# Patient Record
Sex: Female | Born: 1950 | Race: White | Hispanic: No | Marital: Married | Smoking: Former smoker
Health system: Southern US, Community
[De-identification: ages and names within clinical notes are randomized; demographics above are authoritative.]

## PROBLEM LIST (undated history)

## (undated) DIAGNOSIS — G629 Polyneuropathy, unspecified: Secondary | ICD-10-CM

## (undated) DIAGNOSIS — G709 Myoneural disorder, unspecified: Secondary | ICD-10-CM

## (undated) DIAGNOSIS — Z972 Presence of dental prosthetic device (complete) (partial): Secondary | ICD-10-CM

## (undated) DIAGNOSIS — H269 Unspecified cataract: Secondary | ICD-10-CM

## (undated) DIAGNOSIS — M1990 Unspecified osteoarthritis, unspecified site: Secondary | ICD-10-CM

## (undated) DIAGNOSIS — Z973 Presence of spectacles and contact lenses: Secondary | ICD-10-CM

## (undated) DIAGNOSIS — I10 Essential (primary) hypertension: Secondary | ICD-10-CM

## (undated) DIAGNOSIS — R0600 Dyspnea, unspecified: Secondary | ICD-10-CM

## (undated) DIAGNOSIS — R51 Headache: Secondary | ICD-10-CM

## (undated) HISTORY — PX: EYE SURGERY: SHX253

## (undated) HISTORY — PX: SPINE SURGERY: SHX786

## (undated) HISTORY — PX: ABDOMINAL HYSTERECTOMY: SHX81

## (undated) HISTORY — DX: Unspecified osteoarthritis, unspecified site: M19.90

## (undated) HISTORY — DX: Unspecified cataract: H26.9

## (undated) HISTORY — PX: APPENDECTOMY: SHX54

## (undated) HISTORY — DX: Headache: R51

## (undated) HISTORY — PX: TUBOPLASTY / TUBOTUBAL ANASTOMOSIS: SUR1392

## (undated) HISTORY — PX: DILATION AND CURETTAGE OF UTERUS: SHX78

## (undated) HISTORY — DX: Essential (primary) hypertension: I10

## (undated) HISTORY — PX: STOMACH SURGERY: SHX791

## (undated) HISTORY — PX: KNEE SURGERY: SHX244

---

## 2000-01-08 ENCOUNTER — Encounter: Payer: Self-pay | Admitting: Internal Medicine

## 2000-01-08 ENCOUNTER — Ambulatory Visit (HOSPITAL_COMMUNITY): Admission: RE | Admit: 2000-01-08 | Discharge: 2000-01-08 | Payer: Self-pay | Admitting: Internal Medicine

## 2000-12-07 ENCOUNTER — Encounter: Payer: Self-pay | Admitting: Internal Medicine

## 2000-12-07 ENCOUNTER — Encounter: Admission: RE | Admit: 2000-12-07 | Discharge: 2000-12-07 | Payer: Self-pay | Admitting: Internal Medicine

## 2001-08-15 ENCOUNTER — Encounter: Payer: Self-pay | Admitting: Internal Medicine

## 2001-08-15 ENCOUNTER — Ambulatory Visit (HOSPITAL_COMMUNITY): Admission: RE | Admit: 2001-08-15 | Discharge: 2001-08-15 | Payer: Self-pay | Admitting: Internal Medicine

## 2003-01-31 ENCOUNTER — Encounter: Payer: Self-pay | Admitting: Family Medicine

## 2003-01-31 ENCOUNTER — Ambulatory Visit (HOSPITAL_COMMUNITY): Admission: RE | Admit: 2003-01-31 | Discharge: 2003-01-31 | Payer: Self-pay | Admitting: Family Medicine

## 2003-10-31 ENCOUNTER — Encounter: Admission: RE | Admit: 2003-10-31 | Discharge: 2003-10-31 | Payer: Self-pay | Admitting: Family Medicine

## 2004-01-23 ENCOUNTER — Encounter: Admission: RE | Admit: 2004-01-23 | Discharge: 2004-03-12 | Payer: Self-pay | Admitting: Neurosurgery

## 2004-04-14 ENCOUNTER — Emergency Department (HOSPITAL_COMMUNITY): Admission: EM | Admit: 2004-04-14 | Discharge: 2004-04-14 | Payer: Self-pay | Admitting: Emergency Medicine

## 2004-06-19 ENCOUNTER — Ambulatory Visit: Payer: Self-pay | Admitting: Family Medicine

## 2004-09-17 ENCOUNTER — Ambulatory Visit: Payer: Self-pay | Admitting: Family Medicine

## 2004-12-15 ENCOUNTER — Ambulatory Visit: Payer: Self-pay | Admitting: Family Medicine

## 2006-05-17 ENCOUNTER — Ambulatory Visit: Payer: Self-pay | Admitting: Family Medicine

## 2006-05-18 ENCOUNTER — Ambulatory Visit (HOSPITAL_COMMUNITY): Admission: RE | Admit: 2006-05-18 | Discharge: 2006-05-18 | Payer: Self-pay | Admitting: Family Medicine

## 2006-05-23 ENCOUNTER — Encounter: Admission: RE | Admit: 2006-05-23 | Discharge: 2006-05-23 | Payer: Self-pay | Admitting: Neurosurgery

## 2006-07-29 ENCOUNTER — Inpatient Hospital Stay (HOSPITAL_COMMUNITY): Admission: RE | Admit: 2006-07-29 | Discharge: 2006-07-31 | Payer: Self-pay | Admitting: Neurosurgery

## 2006-07-29 HISTORY — PX: CERVICAL FUSION: SHX112

## 2006-09-14 ENCOUNTER — Ambulatory Visit: Payer: Self-pay | Admitting: Family Medicine

## 2007-04-19 ENCOUNTER — Encounter: Payer: Self-pay | Admitting: Family Medicine

## 2007-05-12 HISTORY — PX: CERVICAL FUSION: SHX112

## 2007-07-05 ENCOUNTER — Encounter: Payer: Self-pay | Admitting: Family Medicine

## 2007-10-28 ENCOUNTER — Encounter: Payer: Self-pay | Admitting: Family Medicine

## 2008-05-06 ENCOUNTER — Emergency Department (HOSPITAL_COMMUNITY): Admission: EM | Admit: 2008-05-06 | Discharge: 2008-05-06 | Payer: Self-pay | Admitting: Family Medicine

## 2008-10-19 ENCOUNTER — Ambulatory Visit: Payer: Self-pay | Admitting: Family Medicine

## 2008-10-19 DIAGNOSIS — R32 Unspecified urinary incontinence: Secondary | ICD-10-CM | POA: Insufficient documentation

## 2008-10-19 DIAGNOSIS — H6120 Impacted cerumen, unspecified ear: Secondary | ICD-10-CM

## 2008-10-19 DIAGNOSIS — R51 Headache: Secondary | ICD-10-CM | POA: Insufficient documentation

## 2008-10-19 DIAGNOSIS — R519 Headache, unspecified: Secondary | ICD-10-CM | POA: Insufficient documentation

## 2009-01-01 ENCOUNTER — Ambulatory Visit: Payer: Self-pay | Admitting: Family Medicine

## 2009-01-01 DIAGNOSIS — R609 Edema, unspecified: Secondary | ICD-10-CM

## 2009-01-02 ENCOUNTER — Telehealth: Payer: Self-pay | Admitting: Family Medicine

## 2009-01-02 LAB — CONVERTED CEMR LAB
Albumin: 3.6 g/dL (ref 3.5–5.2)
Basophils Relative: 0.5 % (ref 0.0–3.0)
Bilirubin, Direct: 0 mg/dL (ref 0.0–0.3)
Glucose, Bld: 97 mg/dL (ref 70–99)
Hemoglobin: 13.2 g/dL (ref 12.0–15.0)
Lymphs Abs: 4.7 10*3/uL — ABNORMAL HIGH (ref 0.7–4.0)
Monocytes Relative: 5 % (ref 3.0–12.0)
Neutro Abs: 7.7 10*3/uL (ref 1.4–7.7)
Neutrophils Relative %: 58.1 % (ref 43.0–77.0)
Potassium: 4.2 meq/L (ref 3.5–5.1)
TSH: 0.86 microintl units/mL (ref 0.35–5.50)

## 2009-02-25 ENCOUNTER — Encounter (INDEPENDENT_AMBULATORY_CARE_PROVIDER_SITE_OTHER): Payer: Self-pay | Admitting: *Deleted

## 2009-10-08 ENCOUNTER — Encounter: Payer: Self-pay | Admitting: Family Medicine

## 2009-10-24 ENCOUNTER — Ambulatory Visit: Payer: Self-pay | Admitting: Family Medicine

## 2009-10-24 ENCOUNTER — Encounter: Admission: RE | Admit: 2009-10-24 | Discharge: 2009-10-24 | Payer: Self-pay | Admitting: Neurosurgery

## 2009-11-04 ENCOUNTER — Encounter: Payer: Self-pay | Admitting: Family Medicine

## 2010-04-29 ENCOUNTER — Ambulatory Visit: Payer: Self-pay | Admitting: Family Medicine

## 2010-04-29 DIAGNOSIS — M5030 Other cervical disc degeneration, unspecified cervical region: Secondary | ICD-10-CM | POA: Insufficient documentation

## 2010-04-29 DIAGNOSIS — L820 Inflamed seborrheic keratosis: Secondary | ICD-10-CM

## 2010-05-02 ENCOUNTER — Telehealth: Payer: Self-pay | Admitting: Family Medicine

## 2010-05-06 ENCOUNTER — Encounter: Payer: Self-pay | Admitting: Family Medicine

## 2010-05-06 ENCOUNTER — Ambulatory Visit: Payer: Self-pay | Admitting: Internal Medicine

## 2010-05-14 ENCOUNTER — Encounter: Payer: Self-pay | Admitting: Family Medicine

## 2010-05-14 ENCOUNTER — Ambulatory Visit
Admission: RE | Admit: 2010-05-14 | Discharge: 2010-05-14 | Payer: Self-pay | Source: Home / Self Care | Attending: Family Medicine | Admitting: Family Medicine

## 2010-05-30 ENCOUNTER — Ambulatory Visit
Admission: RE | Admit: 2010-05-30 | Discharge: 2010-05-30 | Payer: Self-pay | Source: Home / Self Care | Attending: Family Medicine | Admitting: Family Medicine

## 2010-06-01 ENCOUNTER — Encounter: Payer: Self-pay | Admitting: Neurosurgery

## 2010-06-10 NOTE — Letter (Signed)
Summary: [facility]  [facility]   Imported By: Maryln Gottron 12/10/2009 15:00:01  _____________________________________________________________________  External Attachment:    Type:   Image     Comment:   External Document

## 2010-06-10 NOTE — Letter (Signed)
Summary: [facility]  [facility]   Imported By: Maryln Gottron 10/16/2009 14:07:02  _____________________________________________________________________  External Attachment:    Type:   Image     Comment:   External Document

## 2010-06-10 NOTE — Assessment & Plan Note (Signed)
Summary: ear pain 3pm/njr   Vital Signs:  Patient profile:   60 year old female Weight:      170 pounds Temp:     98.4 degrees F oral BP sitting:   140 / 84  (left arm) Cuff size:   regular  Vitals Entered By: Kern Reap CMA Duncan Dull) (October 24, 2009 2:55 PM) CC: right ear pain   CC:  right ear pain.  History of Present Illness: [name]  is a 60 year old female who comes in today for evaluation of ear pain.  She thinks it may be related to ear wax  the patient was taken to the treatment room.  Via suction and irrigation by me.  The ear wax was removed.  She had no complications from the procedure, left the office in good condition with no complaints  Allergies: 1)  ! Penicillin  Past History:  Past medical, surgical, family and social histories (including risk factors) reviewed for relevance to current acute and chronic problems.  Past Medical History: Reviewed history from 10/19/2008 and no changes required. Headache Urinary incontinence  Past Surgical History: Reviewed history from 10/19/2008 and no changes required. Tuboplasty twice Appendectomy Hysterectomy Oophorectomy four level cervical spine fusion and plating per Dr. Tressie Stalker 2008  Family History: Reviewed history from 10/19/2008 and no changes required. Family History of Arthritis Family History Breast cancer 1st degree relative <50 Family History Hypertension  Social History: Reviewed history from 10/19/2008 and no changes required. Married Current Smoker Alcohol use-yes  Review of Systems      See HPI  Physical Exam  General:  Well-developed,well-nourished,in no acute distress; alert,appropriate and cooperative throughout examination Ears:  bilateral cerumen impactions   Impression & Recommendations:  Problem # 1:  CERUMEN IMPACTION (ICD-380.4) Assessment Deteriorated  Complete Medication List: 1)  Hydrocodone-acetaminophen 10-325 Mg Tabs (Hydrocodone-acetaminophen) .Marland Kitchen.. 1 by  mouth every 6 hours as needed for pain 2)  Lasix 20 Mg Tabs (Furosemide) .... Once daily as needed fluid  Patient Instructions: 1)  return p.r.n.

## 2010-06-12 NOTE — Assessment & Plan Note (Signed)
Summary: mole removal--per dr Batul Diego//ccm   Vital Signs:  Patient profile:   60 year old female Weight:      170 pounds O2 Sat:      98 % Temp:     98.2 degrees F Pulse rate:   92 / minute BP sitting:   130 / 80  (left arm) Cuff size:   regular  Vitals Entered By: Pura Spice, RN (May 14, 2010 3:28 PM) CC: mole removal on back    History of Present Illness: Here to remove a lesion in the center of her upper back. We evaluated this at our last visit. It appears to be a seborrheic keratosis.   Allergies: 1)  ! Penicillin  Review of Systems  The patient denies anorexia, fever, weight loss, weight gain, vision loss, decreased hearing, hoarseness, chest pain, syncope, dyspnea on exertion, peripheral edema, prolonged cough, headaches, hemoptysis, abdominal pain, melena, hematochezia, severe indigestion/heartburn, hematuria, incontinence, genital sores, muscle weakness, suspicious skin lesions, transient blindness, difficulty walking, depression, unusual weight change, abnormal bleeding, enlarged lymph nodes, angioedema, breast masses, and testicular masses.    Physical Exam  General:  Well-developed,well-nourished,in no acute distress; alert,appropriate and cooperative throughout examination Skin:  there is a dark brown raised papular lesion between the scapulae, about 1. 6 cm in diameter   Impression & Recommendations:  Problem # 1:  INFLAMED SEBORRHEIC KERATOSIS (ICD-702.11)  Orders: Excise lesion (TAL) 1.1 - 2.0 cm (11402)  Complete Medication List: 1)  Hydrocodone-acetaminophen 10-325 Mg Tabs (Hydrocodone-acetaminophen) .Marland Kitchen.. 1 by mouth every 6 hours as needed for pain 2)  Lasix 20 Mg Tabs (Furosemide) .... Once daily as needed fluid  Patient Instructions: 1)  After informed consent was obtained, the area was cleansed with Betadine. LA was achieved using 1% lidocaine with epinephrine. An ellipitical incision was made around the lesion with a scalpel down to the  subcutaneous fat layer. The lesion was removed and sent to Pathology. The wound was closed using 7 sutures of 4-0 Ethilon. It was dressed with Neosporin and a Bandaid. Sutures are to be removed after 14 days.    Orders Added: 1)  Excise lesion (TAL) 1.1 - 2.0 cm [11402]

## 2010-06-12 NOTE — Miscellaneous (Signed)
Summary: Consent for Mole Removal  Consent for Mole Removal   Imported By: Maryln Gottron 05/19/2010 13:51:36  _____________________________________________________________________  External Attachment:    Type:   Image     Comment:   External Document

## 2010-06-12 NOTE — Progress Notes (Signed)
Summary: Pain med wants early refill - denied  Phone Note Call from Patient Call back at [facility] Phone [phone] Call back at Work Phone [phone]   Caller: Patient Call For: Nelwyn Salisbury MD Summary of Call: Needs early refill on Hydrocodone????  Cannot be filled until Sunday.  Frazier Butt Initial call taken by: Lynann Beaver CMA AAMA,  May 02, 2010 8:24 AM  Follow-up for Phone Call        chart reviewed. prescription provided for hydrocodone 10/325mg  #60 rf5 dated 12/20. Also [place] narcotic database shows #100 hydrocodone filled 12/7 by Dr. Lovell Sheehan of [facility] Follow-up by: Edwyna Perfect MD,  May 02, 2010 9:24 AM  Additional Follow-up for Phone Call Additional follow up Details #1::        spoke with pt asked her if got rx  on 12/7 from Dr Lovell Sheehan at [facility] and she confirmed he did and she had it filled informed her that dr Daryan Cagley will be back on Tuesday and since filled by Dr Lovell Sheehan no early refill until Dr Acasia Skilton returns.  Additional Follow-up by: Pura Spice, RN,  May 02, 2010 9:36 AM    Additional Follow-up for Phone Call Additional follow up Details #2::    Since she is getting pain meds form Dr. Lovell Sheehan, she will need to get all of them from him. I will not be giving her any more  Follow-up by: Nelwyn Salisbury MD,  May 06, 2010 8:46 AM  Additional Follow-up for Phone Call Additional follow up Details #3:: Details for Additional Follow-up Action Taken: spoke with pt - informed her that per Dr. Clent Ridges - Dr. Lovell Sheehan will need to fill pain meds since she recv'd rx 12/7 from him. He will no longer fill pain med. Verbalized understanding. KIK Additional Follow-up by: Duard Brady LPN,  May 06, 2010 2:11 PM

## 2010-06-12 NOTE — Assessment & Plan Note (Signed)
Summary: remove sutures?dm   Vital Signs:  Patient profile:   60 year old female Pulse rate:   96 / minute Resp:     16 per minute BP sitting:   140 / 90  (left arm) Cuff size:   regular  Vitals Entered By: Pura Spice, RN (May 30, 2010 3:41 PM) CC: remove sutures   History of Present Illness: Here for suture removal after we removed a seborrheic keratosis from her back 2 weeks ago. The area itches but otherwise has been doing well.   Allergies: 1)  ! Penicillin  Past History:  Past Medical History: Reviewed history from 10/19/2008 and no changes required. Headache Urinary incontinence  Past Surgical History: Reviewed history from 10/19/2008 and no changes required. Tuboplasty twice Appendectomy Hysterectomy Oophorectomy four level cervical spine fusion and plating per Dr. Tressie Stalker 2008  Review of Systems  The patient denies anorexia, fever, weight loss, weight gain, vision loss, decreased hearing, hoarseness, chest pain, syncope, dyspnea on exertion, peripheral edema, prolonged cough, headaches, hemoptysis, abdominal pain, melena, hematochezia, severe indigestion/heartburn, hematuria, incontinence, genital sores, muscle weakness, suspicious skin lesions, transient blindness, difficulty walking, depression, unusual weight change, abnormal bleeding, enlarged lymph nodes, angioedema, breast masses, and testicular masses.    Physical Exam  General:  Well-developed,well-nourished,in no acute distress; alert,appropriate and cooperative throughout examination Skin:  the wound in the middle of her back is healing well   Impression & Recommendations:  Problem # 1:  INFLAMED SEBORRHEIC KERATOSIS (ICD-702.11)  Complete Medication List: 1)  Hydrocodone-acetaminophen 10-325 Mg Tabs (Hydrocodone-acetaminophen) .Marland Kitchen.. 1 by mouth every 6 hours as needed for pain 2)  Lasix 20 Mg Tabs (Furosemide) .... Once daily as needed fluid  Patient Instructions: 1)  sutures were  removed.  2)  Please schedule a follow-up appointment as needed .    Orders Added: 1)  Est. Patient Level II [21308]

## 2010-06-12 NOTE — Miscellaneous (Signed)
Summary: BONE DENSITY  Clinical Lists Changes  Orders: Added new Test order of T-Bone Densitometry (77080) - Signed Added new Test order of T-Lumbar Vertebral Assessment (77082) - Signed 

## 2010-06-12 NOTE — Assessment & Plan Note (Signed)
Summary: mole check/njr   Vital Signs:  Patient profile:   60 year old female O2 Sat:      98 % Temp:     98.6 degrees F BP sitting:   130 / 78  (left arm)  Vitals Entered By: Pura Spice, RN (April 29, 2010 3:25 PM) CC: ck mole mid back stated pass 2 wks its been hurting  refill hydrocodone    History of Present Illness: Here for several reasons. First she has had a lesion in the middle of her upper back for years, but lately it has gotten very tender and she wants it removed. Also she needs refills on her pain meds. She has seen Dr. Tressie Stalker about the disc disease in her neck, and he has proposed a repeat fusion surgery for C4-T1. However they agreed to postpone this as long as possible. She also asks about the growing hump on the back of her neck. She has never had a bone density test.   Allergies: 1)  ! Penicillin  Past History:  Past Medical History: Reviewed history from 10/19/2008 and no changes required. Headache Urinary incontinence  Past Surgical History: Reviewed history from 10/19/2008 and no changes required. Tuboplasty twice Appendectomy Hysterectomy Oophorectomy four level cervical spine fusion and plating per Dr. Tressie Stalker 2008  Review of Systems  The patient denies anorexia, fever, weight loss, weight gain, vision loss, decreased hearing, hoarseness, chest pain, syncope, dyspnea on exertion, peripheral edema, prolonged cough, headaches, hemoptysis, abdominal pain, melena, hematochezia, severe indigestion/heartburn, hematuria, incontinence, genital sores, muscle weakness, suspicious skin lesions, transient blindness, difficulty walking, depression, unusual weight change, abnormal bleeding, enlarged lymph nodes, angioedema, breast masses, and testicular masses.    Physical Exam  General:  Well-developed,well-nourished,in no acute distress; alert,appropriate and cooperative throughout examination Neck:  she has a mildly prominent Dowagers hump  on the back of the neck Skin:  she has a large inflamed seborrheic keratosis on the upper back    Impression & Recommendations:  Problem # 1:  DEGENERATIVE DISC DISEASE, CERVICAL SPINE (ICD-722.4)  Problem # 2:  INFLAMED SEBORRHEIC KERATOSIS (ICD-702.11)  Complete Medication List: 1)  Hydrocodone-acetaminophen 10-325 Mg Tabs (Hydrocodone-acetaminophen) .Marland Kitchen.. 1 by mouth every 6 hours as needed for pain 2)  Lasix 20 Mg Tabs (Furosemide) .... Once daily as needed fluid  Other Orders: T-Bone Densitometry [phone])  Patient Instructions: 1)  We will plan on excising the back lesion soon. refilled her Vicodin. Set her up for a DEXA soon. Prescriptions: HYDROCODONE-ACETAMINOPHEN 10-325 MG TABS (HYDROCODONE-ACETAMINOPHEN) 1 by mouth every 6 hours as needed for pain  #60 x 5   Entered and Authorized by:   Nelwyn Salisbury MD   Signed by:   Nelwyn Salisbury MD on 04/29/2010   Method used:   Print then Give to Patient   RxID:   [id number]    Orders Added: 1)  Est. Patient Level IV [95621] 2)  T-Bone Densitometry [30865]

## 2010-09-16 ENCOUNTER — Ambulatory Visit (INDEPENDENT_AMBULATORY_CARE_PROVIDER_SITE_OTHER): Payer: Self-pay | Admitting: Family Medicine

## 2010-09-16 ENCOUNTER — Encounter: Payer: Self-pay | Admitting: Family Medicine

## 2010-09-16 VITALS — BP 156/92 | HR 88 | Temp 98.9°F | Ht 64.0 in | Wt 175.0 lb

## 2010-09-16 DIAGNOSIS — G589 Mononeuropathy, unspecified: Secondary | ICD-10-CM

## 2010-09-16 DIAGNOSIS — M542 Cervicalgia: Secondary | ICD-10-CM

## 2010-09-16 DIAGNOSIS — G629 Polyneuropathy, unspecified: Secondary | ICD-10-CM

## 2010-09-16 DIAGNOSIS — G8929 Other chronic pain: Secondary | ICD-10-CM

## 2010-09-16 LAB — CBC WITH DIFFERENTIAL/PLATELET
Eosinophils Absolute: 0.1 10*3/uL (ref 0.0–0.7)
Hemoglobin: 12.8 g/dL (ref 12.0–15.0)
Lymphocytes Relative: 42.5 % (ref 12.0–46.0)
Lymphs Abs: 5.3 10*3/uL — ABNORMAL HIGH (ref 0.7–4.0)
MCHC: 33.1 g/dL (ref 30.0–36.0)
Monocytes Absolute: 0.8 10*3/uL (ref 0.1–1.0)
Platelets: 296 10*3/uL (ref 150.0–400.0)
RDW: 14 % (ref 11.5–14.6)
WBC: 12.5 10*3/uL — ABNORMAL HIGH (ref 4.5–10.5)

## 2010-09-16 LAB — BASIC METABOLIC PANEL
BUN: 9 mg/dL (ref 6–23)
CO2: 25 mEq/L (ref 19–32)
Calcium: 8.5 mg/dL (ref 8.4–10.5)
Chloride: 106 mEq/L (ref 96–112)
Creatinine, Ser: 0.7 mg/dL (ref 0.4–1.2)
Glucose, Bld: 74 mg/dL (ref 70–99)
Sodium: 139 mEq/L (ref 135–145)

## 2010-09-16 LAB — HEPATIC FUNCTION PANEL
ALT: 6 U/L (ref 0–35)
AST: 13 U/L (ref 0–37)
Alkaline Phosphatase: 94 U/L (ref 39–117)
Total Bilirubin: 0.1 mg/dL — ABNORMAL LOW (ref 0.3–1.2)
Total Protein: 6.7 g/dL (ref 6.0–8.3)

## 2010-09-16 LAB — POCT URINALYSIS DIPSTICK
Bilirubin, UA: NEGATIVE
Ketones, UA: NEGATIVE
Leukocytes, UA: NEGATIVE
Protein, UA: NEGATIVE
Spec Grav, UA: 1.015
Urobilinogen, UA: 0.2
pH, UA: 5.5

## 2010-09-16 LAB — VITAMIN B12: Vitamin B-12: 153 pg/mL — ABNORMAL LOW (ref 211–911)

## 2010-09-16 MED ORDER — GABAPENTIN 100 MG PO CAPS: 100.0000 mg | ORAL_CAPSULE | Freq: Three times a day (TID) | ORAL | Status: DC

## 2010-09-16 NOTE — Progress Notes (Signed)
  Subjective:    Patient ID: [name], female    DOB: 20-Jun-1950, 60 y.o.   MRN: [id number]  HPI Here to ask advice about pain management. She has been seeing Dr. Lovell Sheehan about her cervical spine disease and the pain in her neck and arms. He has been refilling her Vicodin but they will not schedule her for any more office visits until she pays her overdue bills. Now she has developed tingling and burning sensations all over her body in the past 2 months. No other changes.    Review of Systems  Constitutional: Negative.   Respiratory: Negative.   Cardiovascular: Negative.   Musculoskeletal: Positive for back pain.  Neurological: Negative.        Objective:   Physical Exam  Constitutional: She is oriented to person, place, and time. She appears well-developed and well-nourished.  Cardiovascular: Normal rate, regular rhythm, normal heart sounds and intact distal pulses.   Pulmonary/Chest: Effort normal and breath sounds normal.  Neurological: She is alert and oriented to person, place, and time. No cranial nerve deficit. She exhibits normal muscle tone. Coordination normal.          Assessment & Plan:  This sounds like a neuropathy that has started. Get labs today to look for metabolic causes for this. Add Neurontin to her pain meds. Recheck in 2 weeks

## 2010-09-18 NOTE — Progress Notes (Signed)
Pt Informed. Will call to schedule Injection appointment.

## 2010-09-26 NOTE — Op Note (Signed)
[name], [name] NO.:  [id number]   MEDICAL RECORD NO.:  [id number]          PATIENT TYPE:  INP   LOCATION:  3105                         FACILITY:  [facility]   PHYSICIAN:  Cristi Loron, M.D.DATE OF BIRTH:  [birth year]   DATE OF PROCEDURE:  07/30/2006  DATE OF DISCHARGE:                               OPERATIVE REPORT   BRIEF HISTORY:  The patient is a 60 year old white female who has  suffered from neck and arm pain.  She has failed medical management, was  worked up with a cervical MRI which demonstrated significant spondylosis  and stenosis at C4-5, C5-6, C6-7 and C7-S1 which corresponded to the  patient's symptoms.  I discussed the various treatment options with the  patient including surgery.  She and her family have weighed the risks,  benefits and alternatives.  She decided to proceed with a four level  anterior cervical diskectomy and fusion plating.   PREOPERATIVE DIAGNOSIS:  C4-5, C5-6, C6-7 and C-7-1 spondylosis,  stenosis, cervical radiculopathy, cervicalgia.   POSTOPERATIVE DIAGNOSES:  C4-5, C5-6, C6-7 and C-7-1 spondylosis,  stenosis, cervical radiculopathy, cervicalgia.   PROCEDURE:  C4-5, C5-6, C6-7 and C-7-1 extensive anterior cervical  diskectomy/decompression; C4-5, C5-6, C6-7, C-7-1 interbody arthrodesis  with local autograft bone; insertion of C4-5, C6-7, C6-7 C-7-1 interbody  prosthesis (Alphatec PEEK interbody prosthesis); anterior cervical  plating C4 to T1 using Codman Slim-Loc titanium plate and screws.   SURGEON:  Dr. Delma Officer   ASSISTANT:  Dr. Barnett Abu   ANESTHESIA:  General endotracheal.   ESTIMATED BLOOD LOSS:  200 mL.   SPECIMENS:  None.   DRAINS:  One 7 mm Jackson-Pratt drain in the prevertebral space.   COMPLICATIONS:  None.   DESCRIPTION OF PROCEDURE:  The patient was brought to the operating room  by anesthesia team.  General endotracheal anesthesia was induced.  The  patient remained in the supine  position.  A roll was placed under her  shoulders to place her neck in slight extension.  Her anterior cervical  region was then prepared with Betadine scrub and Betadine solution.  Sterile drapes were applied and then injected the area to be incised  with Marcaine with epinephrine solution.  I used a scalpel to make a  transverse incision in the patient's left anterior neck.  I used a  Metzenbaum scissors to divide the platysma muscle and then to dissect  medial to sternocleidomastoid muscle of jugular vein and carotid artery.  I carefully dissected down towards the anterior cervical spine,  carefully identifying the esophagus and retracted it medially.  We then  used Kitner swabs to clear the soft tissue from the anterior cervical  spine and then inserted a bent spinal needle into the upper exposed  intervertebral disc space.  We then obtained intraoperative radiograph  to confirm our location.   We then used electrocautery to detach the medial border of the longus  colli muscle bilaterally from C4-5, C5-6, C6-7 and C-7-1 intervertebral  disk space.  We then inserted Caspar self-retaining retractor for  exposure.  We began at C4-5, incised the C4-5  intervertebral disk with  15 blade scalpel.  We performed a partial intervertebral diskectomy  using pituitary forceps and Carlen curettes.  We then inserted  distraction screws at C4 and C5 and then distracted the interspace.  We  then used a high-speed drill to decorticate the vertebral endplates at  C4-5, drill away the remainder of the C4-5 intervertebral disk, drill  away some posterior spondylosis and then to thin out the posterior  longitudinal ligament.  We then incised the ligament with the with the  arachnoid knife and then removed it with Kerrison punch undercutting the  vertebral endplates decompressing the thecal sac and then performed  foraminotomy a foraminotomy about the bilateral C5 nerve root completing  the decompression  at this level.   We then repeated this procedure in an analogous fashion at C5-6, C6-7,  and C-7-1, decompressing the thecal sac/spinal cord at the C5-6, C6-7  and C-7-1 and performing foraminotomies about the bilateral C6, C7 and 1  nerve roots.   After completing the decompression we now turned attention to the  arthrodesis.  We used the trial spacers and determined to use Alphatec  medium interbody prosthesis.  We prefilled these prosthesis with a  combination of local autograft bone we painted on the decompression as  well as bone graft extender.  We then inserted these prosthesis into  distracted interspaces sequentially.  We then removed the distraction  screws.  There was good snug fit of the prosthesis at each level.   Having completed the arthrodesis insertion of the prosthesis, we now  turned our attention to the anterior spinal instrumentation.  We used a  high-speed drill to remove some ventral spondylosis from the vertebral  endplates at C4-5, C5-6, and C6-7 and C7-1 so that the plate would lay  down flat.  We then selected the appropriate length Codman Slim-Loc  anterior cervical plate and laid it along the anterior aspect of the  vertebral bodies from C4 to T1.  We then drilled two 12 mm holes at the  C4-5, C5-6, C6-7 and C-7-1.  We then secured the plate to the vertebral  bodies by placing two 12 mL mm self-tapping screws at C4-5, C6-7, and C-  7-1.  We obtained intraoperative radiograph that demonstrated good  position of plate, screws, upper plate and screws and prosthesis.  The  lower plate and screws looked good in vivo.  We therefore secured the  screws and plate by locking each cam.   We then obtained hemostasis using bipolar cautery.  We irrigated the  wound out with bacitracin solution, removed the retractor and then  inspected the esophagus for any damage.  There was none apparent.  We then placed a 7 mm flat Jackson-Pratt drain in the prevertebral space.  We  tunneled out through a separate stab wound.  We then reapproximated  the patient's platysma muscle with interrupted 3-0 Vicryl suture,  subcutaneous tissue with a 3-0 Vicryl suture and skin with Steri-Strips  and Benzoin.  We sutured the drain site to the site with a 3-0 nylon  suture.  We then coated the wound with Bacitracin ointment and applied a  sterile dressing.  We removed the drapes.  The patient was subsequently  extubated by the anesthesia team and transported to post anesthesia care  unit in stable condition.  All sponge, instrument and needle counts  correct at the end of this case.      Cristi Loron, M.D.  Electronically Signed  JDJ/MEDQ  D:  07/30/2006  T:  07/30/2006  Job:  [id number]

## 2010-09-26 NOTE — Discharge Summary (Signed)
NAMELUCILE, [name] NO.:  [id number]   MEDICAL RECORD NO.:  [id number]          PATIENT TYPE:  INP   LOCATION:  3004                         FACILITY:  [facility]   PHYSICIAN:  Cristi Loron, M.D.DATE OF BIRTH:  [birth year]   DATE OF ADMISSION:  07/29/2006  DATE OF DISCHARGE:  07/31/2006                               DISCHARGE SUMMARY   BRIEF HISTORY:  The patient is a 60 year old female who has suffered  from neck and arm pain.  She has failed medical management and was  worked up with a cervical MRI, which demonstrated significant  spondylosis and stenosis at C4-5, 5-6, 6-7, and 7-1, which corresponded  to the patient's symptoms.  I discussed the various treatment options  with the patient, including surgery.  She and her family have weighed  the risks, benefits, and alternative to surgery and decided to proceed  with a 4-level anterior cervical discectomy, fusion, and plating.   For further details of this admission, please refer to typed history and  physical.   HOSPITAL COURSE:  I admitted the patient to [facility] on July 29, 2006.  On the [place] of admission, I performed a C4-5, 5-6, 6-7, 7-1  anterior cervical discectomy, interbody prosthesis, arthrodesis, and  plating.  The surgery went well.  (For full details of this operation,  please refer to typed operative note.)   POSTOPERATIVE COURSE:  The patient's postoperative course was  unremarkable.  She was discharged home on postoperative Lindstrom #2, at which  time she was eating well, ambulating well, she was afebrile and vital  signs were stable.   DISCHARGE INSTRUCTIONS:  The patient was given written discharge  instructions and instructed to follow up with me in four weeks.   FINAL DIAGNOSIS:  C4-5, 5-6, 6-7, and 7-1 spondylosis, stenosis,  cervical radiculopathy, cervicalgia.   PROCEDURE PERFORMED:  C4-5, 5-6, 6-7, and 7-1 extensive anterior  cervical discectomy/decompression; C4-5, 5-6,  6-7 interbody arthrodesis  with local autograft bone; C4-5, 5-6, 6-7, and 7-1 interbody prosthesis  (Alphatec PEEK interbody prosthesis); anterior cervical plate C4 to T1  using Codman Slim-Lock titanium plate and screws.      Cristi Loron, M.D.  Electronically Signed     JDJ/MEDQ  D:  09/01/2006  T:  09/01/2006  Job:  [id number]

## 2010-09-30 ENCOUNTER — Encounter: Payer: Self-pay | Admitting: Family Medicine

## 2010-09-30 ENCOUNTER — Ambulatory Visit (INDEPENDENT_AMBULATORY_CARE_PROVIDER_SITE_OTHER): Payer: BC Managed Care – PPO | Admitting: Family Medicine

## 2010-09-30 VITALS — BP 140/88 | Temp 98.1°F | Wt 178.0 lb

## 2010-09-30 DIAGNOSIS — I10 Essential (primary) hypertension: Secondary | ICD-10-CM

## 2010-09-30 DIAGNOSIS — E538 Deficiency of other specified B group vitamins: Secondary | ICD-10-CM

## 2010-09-30 DIAGNOSIS — G589 Mononeuropathy, unspecified: Secondary | ICD-10-CM

## 2010-09-30 DIAGNOSIS — G629 Polyneuropathy, unspecified: Secondary | ICD-10-CM

## 2010-09-30 MED ORDER — CYANOCOBALAMIN 1000 MCG/ML IJ SOLN
1000.0000 ug | Freq: Once | INTRAMUSCULAR | Status: AC
  Administered 2010-09-30: 1000 ug via INTRAMUSCULAR

## 2010-09-30 MED ORDER — LISINOPRIL 10 MG PO TABS: 10.0000 mg | ORAL_TABLET | Freq: Every day | ORAL | Status: DC

## 2010-09-30 NOTE — Progress Notes (Signed)
  Subjective:    Patient ID: [name], female    DOB: 02/13/1951, 60 y.o.   MRN: [id number]  HPI Here to follow up on neuropathy and HTN. Since starting on Neurontin she has felt much better and is very pleased. The burning pains in her arms and hands is gone. She is now able to sleep well at night and has more energy.    Review of Systems  Constitutional: Negative.   Respiratory: Negative.   Cardiovascular: Negative.        Objective:   Physical Exam  Constitutional: She appears well-developed and well-nourished.  Cardiovascular: Normal rate, regular rhythm, normal heart sounds and intact distal pulses.   Pulmonary/Chest: Effort normal and breath sounds normal.          Assessment & Plan:  Stay on Neurontin and weekly B12 shots. Start on Lisinopril for HTN.

## 2010-10-09 ENCOUNTER — Ambulatory Visit (INDEPENDENT_AMBULATORY_CARE_PROVIDER_SITE_OTHER): Payer: BC Managed Care – PPO | Admitting: Family Medicine

## 2010-10-09 DIAGNOSIS — E538 Deficiency of other specified B group vitamins: Secondary | ICD-10-CM

## 2010-10-09 MED ORDER — CYANOCOBALAMIN 1000 MCG/ML IJ SOLN
1000.0000 ug | Freq: Once | INTRAMUSCULAR | Status: AC
  Administered 2010-10-09: 1000 ug via INTRAMUSCULAR

## 2010-10-09 MED ORDER — CYANOCOBALAMIN 1000 MCG/ML IJ SOLN: 1000.0000 ug | Freq: Once | INTRAMUSCULAR | Status: DC

## 2010-10-09 MED ORDER — "SYRINGE/NEEDLE (DISP) 25G X 5/8"" 3 ML MISC": 1.0000 mL | Status: DC

## 2010-10-09 MED ORDER — HYDROCODONE-ACETAMINOPHEN 10-325 MG PO TABS: 1.0000 | ORAL_TABLET | Freq: Four times a day (QID) | ORAL | Status: DC | PRN

## 2010-10-09 NOTE — Patient Instructions (Signed)
Patient received education on self injection of future B12. Rx[s] phoned in to [facility] [place] for B12 injection solution, BD injection needle [25Gx5/8"] with 3mL syringe.  Also Rx phoned in for Hydrocodone-APAP 10-325mg  tablets #60x5 per VO Dr Clent Ridges at patient request.

## 2010-10-12 ENCOUNTER — Emergency Department (HOSPITAL_COMMUNITY)
Admission: EM | Admit: 2010-10-12 | Discharge: 2010-10-13 | Disposition: A | Discharge status: Home / Self Care | Payer: BC Managed Care – PPO | Attending: Emergency Medicine | Admitting: Emergency Medicine

## 2010-10-12 DIAGNOSIS — S61209A Unspecified open wound of unspecified finger without damage to nail, initial encounter: Secondary | ICD-10-CM | POA: Insufficient documentation

## 2010-10-12 DIAGNOSIS — W268XXA Contact with other sharp object(s), not elsewhere classified, initial encounter: Secondary | ICD-10-CM | POA: Insufficient documentation

## 2010-10-30 ENCOUNTER — Encounter: Payer: Self-pay | Admitting: Family Medicine

## 2010-10-30 ENCOUNTER — Ambulatory Visit (INDEPENDENT_AMBULATORY_CARE_PROVIDER_SITE_OTHER): Payer: BC Managed Care – PPO | Admitting: Family Medicine

## 2010-10-30 VITALS — BP 110/80 | HR 78 | Wt 171.0 lb

## 2010-10-30 DIAGNOSIS — G589 Mononeuropathy, unspecified: Secondary | ICD-10-CM

## 2010-10-30 DIAGNOSIS — E538 Deficiency of other specified B group vitamins: Secondary | ICD-10-CM

## 2010-10-30 DIAGNOSIS — I10 Essential (primary) hypertension: Secondary | ICD-10-CM

## 2010-10-30 DIAGNOSIS — G629 Polyneuropathy, unspecified: Secondary | ICD-10-CM

## 2010-10-30 MED ORDER — CYANOCOBALAMIN 1000 MCG/ML IJ SOLN
1000.0000 ug | Freq: Once | INTRAMUSCULAR | Status: AC
  Administered 2010-10-30: 1000 ug via INTRAMUSCULAR

## 2010-10-30 NOTE — Progress Notes (Signed)
Addended by: Romualdo Bolk on: 10/30/2010 10:12 AM   Modules accepted: Orders

## 2010-10-30 NOTE — Progress Notes (Signed)
  Subjective:    Patient ID: [name], female    DOB: Sep 29, 1950, 60 y.o.   MRN: [id number]  HPI Here to follow up on newly diagnosed HTN. She is on Lisinopril, and it is working well. She feels good, and the neuropathy is well controlled.   Review of Systems  Constitutional: Negative.   Respiratory: Negative.   Cardiovascular: Negative.   Neurological: Negative.        Objective:   Physical Exam  Constitutional: She appears well-developed and well-nourished.  Cardiovascular: Normal rate, regular rhythm, normal heart sounds and intact distal pulses.   Pulmonary/Chest: Effort normal and breath sounds normal.          Assessment & Plan:  Stay on current meds.

## 2010-11-25 ENCOUNTER — Other Ambulatory Visit: Payer: Self-pay | Admitting: Family Medicine

## 2010-11-26 MED ORDER — FUROSEMIDE 20 MG PO TABS: 20.0000 mg | ORAL_TABLET | Freq: Every day | ORAL | Status: DC

## 2010-12-12 ENCOUNTER — Emergency Department (HOSPITAL_COMMUNITY)
Admission: EM | Admit: 2010-12-12 | Discharge: 2010-12-12 | Disposition: A | Discharge status: Home / Self Care | Payer: BC Managed Care – PPO | Attending: Emergency Medicine | Admitting: Emergency Medicine

## 2010-12-12 ENCOUNTER — Ambulatory Visit (INDEPENDENT_AMBULATORY_CARE_PROVIDER_SITE_OTHER): Payer: BC Managed Care – PPO | Admitting: Family Medicine

## 2010-12-12 ENCOUNTER — Encounter: Payer: Self-pay | Admitting: Family Medicine

## 2010-12-12 VITALS — BP 124/82 | HR 92 | Temp 98.3°F | Wt 173.0 lb

## 2010-12-12 DIAGNOSIS — I10 Essential (primary) hypertension: Secondary | ICD-10-CM | POA: Insufficient documentation

## 2010-12-12 DIAGNOSIS — T783XXA Angioneurotic edema, initial encounter: Secondary | ICD-10-CM | POA: Insufficient documentation

## 2010-12-12 DIAGNOSIS — Z79899 Other long term (current) drug therapy: Secondary | ICD-10-CM | POA: Insufficient documentation

## 2010-12-12 DIAGNOSIS — X58XXXA Exposure to other specified factors, initial encounter: Secondary | ICD-10-CM | POA: Insufficient documentation

## 2010-12-12 MED ORDER — AMLODIPINE BESYLATE 5 MG PO TABS: 5.0000 mg | ORAL_TABLET | Freq: Every day | ORAL | Status: DC

## 2010-12-12 NOTE — Progress Notes (Signed)
  Subjective:    Patient ID: [name], female    DOB: 03-24-1951, 60 y.o.   MRN: [id number]  HPI Here to follow up an ER visit last night for tongue swelling. She had the sudden onset of this at home yesterday afternoon and it did not respond to Benadryl. At the ER she received IV steroids and responded well. They told her it was a reaction to Lisinopril, so she has stopped taking this. We started her on this for HTN about 6 weeks ago. Now she feels fine and her tongue is back to normal. She never had any wheezing or SOB.    Review of Systems  Constitutional: Negative.   HENT: Negative.   Respiratory: Negative.   Cardiovascular: Negative.        Objective:   Physical Exam  Constitutional: She appears well-developed and well-nourished.  HENT:  Right Ear: External ear normal.  Left Ear: External ear normal.  Nose: Nose normal.  Mouth/Throat: Oropharynx is clear and moist. No oropharyngeal exudate.  Eyes: Conjunctivae are normal. Pupils are equal, round, and reactive to light.  Neck: No thyromegaly present.  Pulmonary/Chest: Effort normal and breath sounds normal.  Lymphadenopathy:    She has no cervical adenopathy.          Assessment & Plan:  This was angioedema related to the ACE inhibitor. Stay off Lisinopril, and we will replace it with Amlodipine. Recheck in one month

## 2010-12-23 ENCOUNTER — Other Ambulatory Visit: Payer: Self-pay | Admitting: Family Medicine

## 2010-12-25 ENCOUNTER — Telehealth: Payer: Self-pay | Admitting: Family Medicine

## 2010-12-25 NOTE — Telephone Encounter (Signed)
Refill request for Hydrocodon-Acetaminophn 10-325 take 1 po q6hrs prn. Pt last here on 12/12/10 and script last filled on 12/04/10.

## 2010-12-25 NOTE — Telephone Encounter (Signed)
[facility]. She is completely out. Thanks.

## 2010-12-26 ENCOUNTER — Telehealth: Payer: Self-pay | Admitting: Family Medicine

## 2010-12-26 MED ORDER — HYDROCODONE-ACETAMINOPHEN 10-325 MG PO TABS: 1.0000 | ORAL_TABLET | Freq: Four times a day (QID) | ORAL | Status: DC | PRN

## 2010-12-26 NOTE — Telephone Encounter (Signed)
Script called in and left message for pt. 

## 2010-12-26 NOTE — Telephone Encounter (Signed)
Change the Vicodin rx to say #120 with 5 rf

## 2010-12-26 NOTE — Telephone Encounter (Signed)
NO early refills. I gave her a rx in May that was to last for 6 months

## 2010-12-26 NOTE — Telephone Encounter (Signed)
I spoke with pt and she is taking pain medication 4 a Maret and that she is in pain and has to take this amount for relief.

## 2010-12-26 NOTE — Telephone Encounter (Signed)
Script called in and left voice message for pt. 

## 2011-01-08 ENCOUNTER — Ambulatory Visit (INDEPENDENT_AMBULATORY_CARE_PROVIDER_SITE_OTHER): Payer: BC Managed Care – PPO | Admitting: Family Medicine

## 2011-01-08 ENCOUNTER — Encounter: Payer: Self-pay | Admitting: Family Medicine

## 2011-01-08 VITALS — BP 124/82 | HR 104 | Temp 98.5°F | Ht 64.0 in | Wt 175.0 lb

## 2011-01-08 DIAGNOSIS — I10 Essential (primary) hypertension: Secondary | ICD-10-CM

## 2011-01-08 NOTE — Progress Notes (Signed)
  Subjective:    Patient ID: [name], female    DOB: Oct 15, 1950, 60 y.o.   MRN: [id number]  HPI Here to follow up after a bout of angioedema 4 weeks ago from Lisinopril. This was stopped and the swelling resolved. She has been on Amlodipine, and this is working well. She feels great.    Review of Systems  Constitutional: Negative.   Respiratory: Negative.   Cardiovascular: Negative.        Objective:   Physical Exam  Constitutional: She appears well-developed and well-nourished.  Cardiovascular: Normal rate, regular rhythm, normal heart sounds and intact distal pulses.   Pulmonary/Chest: Effort normal and breath sounds normal.          Assessment & Plan:  Doing well on Amlodipine.

## 2011-02-19 ENCOUNTER — Telehealth: Payer: Self-pay | Admitting: Family Medicine

## 2011-02-19 NOTE — Telephone Encounter (Signed)
Pt called and has gotten a refill sent to a pharmacy in [place] when pt was out of town. Pt said because this is a controlled drug, that she would have to have a new script sent in to [facility], since pt is now back in town, in order for pharmacy to fill.

## 2011-02-20 NOTE — Telephone Encounter (Signed)
Please find out more about this

## 2011-02-23 MED ORDER — HYDROCODONE-ACETAMINOPHEN 10-325 MG PO TABS: 1.0000 | ORAL_TABLET | Freq: Four times a day (QID) | ORAL | Status: DC | PRN

## 2011-02-23 NOTE — Telephone Encounter (Signed)
Pt called back to check on status of getting new script forHYDROcodone-acetaminophen (NORCO) 10-325 MG per tablet  . Pt said that she had gone out of town to [place] and had to have [facility], but now that she is back in town,the script will not transfer back to [place] because it is a controlled substance. There were 4 refills remaining on orig script.

## 2011-02-23 NOTE — Telephone Encounter (Signed)
Please call pt at [phone]

## 2011-02-23 NOTE — Telephone Encounter (Signed)
Please give them the okay for the remaining 4 refills

## 2011-02-23 NOTE — Telephone Encounter (Signed)
Script called in and pt aware. 

## 2011-04-15 ENCOUNTER — Encounter: Payer: Self-pay | Admitting: Family Medicine

## 2011-04-15 ENCOUNTER — Ambulatory Visit (INDEPENDENT_AMBULATORY_CARE_PROVIDER_SITE_OTHER): Payer: BC Managed Care – PPO | Admitting: Family Medicine

## 2011-04-15 VITALS — BP 122/80 | HR 78 | Temp 98.2°F | Wt 174.0 lb

## 2011-04-15 DIAGNOSIS — J40 Bronchitis, not specified as acute or chronic: Secondary | ICD-10-CM

## 2011-04-15 MED ORDER — AZITHROMYCIN 250 MG PO TABS: ORAL_TABLET | ORAL | Status: DC

## 2011-04-15 MED ORDER — HYDROCODONE-HOMATROPINE 5-1.5 MG/5ML PO SYRP: 5.0000 mL | ORAL_SOLUTION | ORAL | Status: DC | PRN

## 2011-04-15 NOTE — Progress Notes (Signed)
  Subjective:    Patient ID: [name], female    DOB: Nov 24, 1950, 60 y.o.   MRN: [id number]  HPI Here for 2 weeks of chest congestion and coughing up green sputum. No fever.    Review of Systems  Constitutional: Negative.   HENT: Negative.   Eyes: Negative.   Respiratory: Positive for cough.        Objective:   Physical Exam  Constitutional: She appears well-developed and well-nourished.  HENT:  Right Ear: External ear normal.  Left Ear: External ear normal.  Nose: Nose normal.  Mouth/Throat: Oropharynx is clear and moist. No oropharyngeal exudate.  Eyes: Conjunctivae are normal.  Neck: No thyromegaly present.  Pulmonary/Chest: Effort normal. She has no wheezes. She has no rales.       Scattered rhonchi  Lymphadenopathy:    She has no cervical adenopathy.          Assessment & Plan:  Recheck prn

## 2011-07-07 ENCOUNTER — Telehealth: Payer: Self-pay | Admitting: Family Medicine

## 2011-07-07 NOTE — Telephone Encounter (Signed)
Refill request for Norco 10-325 take 1 po q6hrs prn and pt last here on 04/15/11.

## 2011-07-08 ENCOUNTER — Telehealth: Payer: Self-pay | Admitting: Family Medicine

## 2011-07-08 MED ORDER — HYDROCODONE-ACETAMINOPHEN 10-325 MG PO TABS: 1.0000 | ORAL_TABLET | Freq: Four times a day (QID) | ORAL | Status: DC | PRN

## 2011-07-08 NOTE — Telephone Encounter (Signed)
Pt called req refill of HYDROcodone-acetaminophen (NORCO) 10-325 MG per tablet to [facility]

## 2011-07-08 NOTE — Telephone Encounter (Signed)
Script was called in to pharmacy today.

## 2011-07-08 NOTE — Telephone Encounter (Signed)
Call in #120 with 5 rf 

## 2011-07-08 NOTE — Telephone Encounter (Signed)
Rx called in to pharmacy. 

## 2011-08-06 ENCOUNTER — Telehealth: Payer: Self-pay | Admitting: Family Medicine

## 2011-08-06 MED ORDER — AMLODIPINE BESYLATE 5 MG PO TABS: 5.0000 mg | ORAL_TABLET | Freq: Every day | ORAL | Status: DC

## 2011-08-06 NOTE — Telephone Encounter (Signed)
I sent script e-scribe and pt did request a 90 Knezevic supply.

## 2011-09-27 ENCOUNTER — Other Ambulatory Visit: Payer: Self-pay | Admitting: Family Medicine

## 2011-10-12 ENCOUNTER — Other Ambulatory Visit: Payer: Self-pay | Admitting: Family Medicine

## 2011-10-13 ENCOUNTER — Other Ambulatory Visit: Payer: Self-pay | Admitting: Family Medicine

## 2011-10-13 MED ORDER — AMLODIPINE BESYLATE 5 MG PO TABS: 5.0000 mg | ORAL_TABLET | Freq: Every day | ORAL | Status: DC

## 2011-10-13 NOTE — Telephone Encounter (Signed)
Pt stated on her bottle she has refills remaining on amlodipine 5 mg however [facility] said [facility].

## 2011-10-13 NOTE — Telephone Encounter (Signed)
I resent script to pharmacy for a 90 Montella supply, per pt request.

## 2011-10-25 ENCOUNTER — Encounter (HOSPITAL_COMMUNITY): Payer: Self-pay | Admitting: *Deleted

## 2011-10-25 ENCOUNTER — Emergency Department (HOSPITAL_COMMUNITY)
Admission: EM | Admit: 2011-10-25 | Discharge: 2011-10-25 | Disposition: A | Discharge status: Home / Self Care | Payer: Self-pay | Attending: Emergency Medicine | Admitting: Emergency Medicine

## 2011-10-25 DIAGNOSIS — K5289 Other specified noninfective gastroenteritis and colitis: Secondary | ICD-10-CM | POA: Insufficient documentation

## 2011-10-25 DIAGNOSIS — R51 Headache: Secondary | ICD-10-CM | POA: Insufficient documentation

## 2011-10-25 DIAGNOSIS — K529 Noninfective gastroenteritis and colitis, unspecified: Secondary | ICD-10-CM

## 2011-10-25 DIAGNOSIS — D72829 Elevated white blood cell count, unspecified: Secondary | ICD-10-CM | POA: Insufficient documentation

## 2011-10-25 DIAGNOSIS — F17200 Nicotine dependence, unspecified, uncomplicated: Secondary | ICD-10-CM | POA: Insufficient documentation

## 2011-10-25 DIAGNOSIS — R000 Tachycardia, unspecified: Secondary | ICD-10-CM | POA: Insufficient documentation

## 2011-10-25 LAB — BASIC METABOLIC PANEL
BUN: 18 mg/dL (ref 6–23)
Calcium: 9.8 mg/dL (ref 8.4–10.5)
Chloride: 98 mEq/L (ref 96–112)
GFR calc non Af Amer: >90 mL/min (ref >90)
Glucose, Bld: 96 mg/dL (ref 70–99)

## 2011-10-25 LAB — CBC
HCT: 42.2 % (ref 36.0–46.0)
MCH: 27.3 pg (ref 26.0–34.0)
Platelets: 365 10*3/uL (ref 150–400)
RDW: 13.8 % (ref 11.5–15.5)

## 2011-10-25 LAB — URINE MICROSCOPIC-ADD ON

## 2011-10-25 LAB — URINALYSIS, ROUTINE W REFLEX MICROSCOPIC: Specific Gravity, Urine: 1.036 — ABNORMAL HIGH (ref 1.005–1.030)

## 2011-10-25 MED ORDER — MORPHINE SULFATE 4 MG/ML IJ SOLN
4.0000 mg | Freq: Once | INTRAMUSCULAR | Status: AC
  Administered 2011-10-25: 4 mg via INTRAVENOUS
  Filled 2011-10-25: qty 1

## 2011-10-25 MED ORDER — ONDANSETRON HCL 4 MG/2ML IJ SOLN
4.0000 mg | Freq: Once | INTRAMUSCULAR | Status: AC
  Administered 2011-10-25: 4 mg via INTRAVENOUS
  Filled 2011-10-25: qty 2

## 2011-10-25 MED ORDER — HYOSCYAMINE SULFATE 0.125 MG PO TABS
0.2500 mg | ORAL_TABLET | Freq: Once | ORAL | Status: AC
  Administered 2011-10-25: 0.25 mg via ORAL
  Filled 2011-10-25: qty 1

## 2011-10-25 MED ORDER — DIPHENOXYLATE-ATROPINE 2.5-0.025 MG PO TABS: 1.0000 | ORAL_TABLET | Freq: Four times a day (QID) | ORAL | Status: AC | PRN

## 2011-10-25 MED ORDER — SODIUM CHLORIDE 0.9 % IV BOLUS (SEPSIS)
1000.0000 mL | Freq: Once | INTRAVENOUS | Status: AC
  Administered 2011-10-25: 1000 mL via INTRAVENOUS

## 2011-10-25 MED ORDER — METOCLOPRAMIDE HCL 10 MG PO TABS: 10.0000 mg | ORAL_TABLET | Freq: Four times a day (QID) | ORAL | Status: DC

## 2011-10-25 NOTE — ED Provider Notes (Signed)
History     CSN: [id number]  Arrival date & time 10/25/11  1502   First MD Initiated Contact with Patient 10/25/11 1607      Chief Complaint  Patient presents with  . Abdominal Pain  . Headache  . Emesis  . Diarrhea    (Consider location/radiation/quality/duration/timing/severity/associated sxs/prior treatment) Patient is a 61 y.o. female presenting with abdominal pain. The history is provided by the patient and the spouse.  Abdominal Pain The primary symptoms of the illness include abdominal pain, nausea, vomiting and diarrhea. The primary symptoms of the illness do not include fever or dysuria.  Additional symptoms associated with the illness include chills. Symptoms associated with the illness do not include constipation or back pain.   the patient is a 61 year old, female, who presents to emergency department complaining of crampy abdominal pain for the past 4 days.  The pain has been accompanied by vomiting, diarrhea, and chills.  She has not had a fever, or rash.  She denies blood in her stool or emesis.  She has not been exposed to anybody with similar symptoms and she denies recent antibiotic use.  Past Medical History  Diagnosis Date  . Headache   . Urinary incontinence   . Hypertension     Past Surgical History  Procedure Date  . Tuboplasty / tubotubal anastomosis   . Appendectomy   . Abdominal hysterectomy     oophorectomy  . Cervical fusion     and plating    Family History  Problem Relation Age of Onset  . Arthritis    . Breast cancer    . Hypertension      History  Substance Use Topics  . Smoking status: Current Everyday Smoker -- 0.5 packs/[place]    Types: Cigarettes  . Smokeless tobacco: Never Used  . Alcohol Use: No    OB History    Grav Para Term Preterm Abortions TAB SAB Ect Mult Living                  Review of Systems  Constitutional: Positive for chills. Negative for fever.  Cardiovascular: Negative for chest pain.    Gastrointestinal: Positive for nausea, vomiting, abdominal pain and diarrhea. Negative for constipation and blood in stool.  Genitourinary: Negative for dysuria.  Musculoskeletal: Negative for back pain.  Skin: Negative for rash.  Neurological: Positive for headaches. Negative for weakness.  Psychiatric/Behavioral: Negative for confusion.  All other systems reviewed and are negative.    Allergies  Lisinopril and Penicillins  Home Medications   Current Outpatient Rx  Name Route Sig Dispense Refill  . AMLODIPINE BESYLATE 5 MG PO TABS Oral Take 5 mg by mouth daily.    . CYANOCOBALAMIN 1000 MCG/ML IJ SOLN Intramuscular Inject 1,000 mcg into the muscle every 30 (thirty) days.     Marland Kitchen GABAPENTIN 100 MG PO CAPS Oral Take 100 mg by mouth 3 (three) times daily.    Marland Kitchen HYDROCODONE-ACETAMINOPHEN 10-325 MG PO TABS Oral Take 1 tablet by mouth every 6 (six) hours as needed. pain    . VITAMIN B-12 500 MCG PO TABS Oral Take 500 mcg by mouth daily.      BP 119/67  Pulse 103  Temp 98.7 F (37.1 C) (Oral)  Resp 20  Ht 5\' 4"  (1.626 m)  Wt 170 lb (77.111 kg)  BMI 29.18 kg/m2  SpO2 97%  Physical Exam  Nursing note and vitals reviewed. Constitutional: She is oriented to person, place, and time. She appears well-developed and well-nourished.  No distress.  HENT:  Head: Normocephalic and atraumatic.  Eyes: Conjunctivae are normal.  Neck: Normal range of motion.  Cardiovascular: Intact distal pulses.   No murmur heard.      Tachycardia  Pulmonary/Chest: Effort normal and breath sounds normal. No respiratory distress.  Abdominal: Soft. Bowel sounds are normal. She exhibits no mass. There is tenderness. There is no rebound and no guarding.       Mild diffuse abdominal tenderness, with no peritoneal sign  Musculoskeletal: Normal range of motion.  Neurological: She is alert and oriented to person, place, and time.  Skin: Skin is warm and dry.  Psychiatric: She has a normal mood and affect. Thought  content normal.    ED Course  Procedures (including critical care time)  Labs Reviewed  CBC - Abnormal; Notable for the following:    WBC 18.2 (*)     RBC 5.31 (*)     All other components within normal limits  BASIC METABOLIC PANEL - Abnormal; Notable for the following:    Potassium 3.3 (*)     All other components within normal limits  URINALYSIS, ROUTINE W REFLEX MICROSCOPIC   No results found.   No diagnosis found.  6:44 PM Pt says her pain is tolerable.  She does not want more meds.  MDM  Abdominal pain, with mild tachycardia - resolved tachycardia. Pain controlled Gastroenteritis with leukocytosis. No fever, rash, toxicity.   No acute abdomen        Cheri Guppy, MD 10/25/11 [phone]

## 2011-10-25 NOTE — ED Notes (Signed)
Mask placed on pt in Triage

## 2011-10-25 NOTE — ED Notes (Signed)
Gave patient a cup of ice per nurse 

## 2011-10-25 NOTE — Discharge Instructions (Signed)
Use Lomotil for recurrent abdominal pain, and diarrhea, and Reglan for nausea, and vomiting.  Followup with your Dr. if your symptoms.  Last more than 2 days.  Return for worse or uncontrolled symptoms

## 2011-10-25 NOTE — ED Notes (Signed)
Pt states she began to have a headache and pain in lower abdomen this past Friday.  Pt states she began vomiting and having diarrhea Friday evening and has been unable to keep fluids or solid food down.  Pt endorses chills, but has not measured her temperature.  Pt states both diarrhea and vomit have been yellow in color.  Pt also c/o diffuse body aches in joints.  Pt denies dizziness, SOB and chest pain.  Pt states she believes several co-workers have been sick with a similar illness.  Pt has good peripheral pulses, bowel sounds are diminished, but she denies pain with palpation.  Pt's lung sounds are clear.

## 2011-10-25 NOTE — ED Notes (Signed)
Discharging to waiting area

## 2011-10-25 NOTE — ED Notes (Addendum)
Pt states "my whole team @ work is out with this, hurt all over too, have had fever and chills too"

## 2011-12-09 ENCOUNTER — Encounter: Payer: Self-pay | Admitting: Family Medicine

## 2011-12-09 ENCOUNTER — Ambulatory Visit (INDEPENDENT_AMBULATORY_CARE_PROVIDER_SITE_OTHER): Payer: BC Managed Care – PPO | Admitting: Family Medicine

## 2011-12-09 VITALS — BP 120/78 | HR 78 | Temp 98.3°F | Wt 166.0 lb

## 2011-12-09 DIAGNOSIS — M5030 Other cervical disc degeneration, unspecified cervical region: Secondary | ICD-10-CM

## 2011-12-09 DIAGNOSIS — R51 Headache: Secondary | ICD-10-CM

## 2011-12-09 DIAGNOSIS — I10 Essential (primary) hypertension: Secondary | ICD-10-CM

## 2011-12-09 MED ORDER — GABAPENTIN 100 MG PO CAPS: 100.0000 mg | ORAL_CAPSULE | Freq: Three times a day (TID) | ORAL | Status: DC

## 2011-12-09 MED ORDER — HYDROCODONE-ACETAMINOPHEN 10-325 MG PO TABS: 1.0000 | ORAL_TABLET | Freq: Four times a day (QID) | ORAL | Status: DC | PRN

## 2011-12-09 MED ORDER — AMLODIPINE BESYLATE 5 MG PO TABS: 5.0000 mg | ORAL_TABLET | Freq: Every day | ORAL | Status: DC

## 2011-12-09 NOTE — Progress Notes (Signed)
  Subjective:    Patient ID: [name], female    DOB: [birth year], 61 y.o.   MRN: [id number]  HPI Here for refills. She feels good, her BP is stable, and her pain is well controlled.   Review of Systems  Constitutional: Negative.   Respiratory: Negative.   Cardiovascular: Negative.   Musculoskeletal: Positive for arthralgias.       Objective:   Physical Exam  Constitutional: She appears well-developed.  Neck: No thyromegaly present.  Cardiovascular: Normal rate, regular rhythm, normal heart sounds and intact distal pulses.   Pulmonary/Chest: Effort normal and breath sounds normal.  Lymphadenopathy:    She has no cervical adenopathy.          Assessment & Plan:  Refilled meds.

## 2012-01-04 ENCOUNTER — Other Ambulatory Visit: Payer: Self-pay | Admitting: Family Medicine

## 2012-01-04 NOTE — Telephone Encounter (Signed)
Can we refill this? 

## 2012-01-05 NOTE — Telephone Encounter (Signed)
Okay for one year  

## 2012-04-18 ENCOUNTER — Encounter: Payer: Self-pay | Admitting: Family Medicine

## 2012-04-18 ENCOUNTER — Ambulatory Visit (INDEPENDENT_AMBULATORY_CARE_PROVIDER_SITE_OTHER): Payer: BC Managed Care – PPO | Admitting: Family Medicine

## 2012-04-18 VITALS — BP 130/70 | HR 92 | Temp 98.5°F | Wt 170.0 lb

## 2012-04-18 DIAGNOSIS — J329 Chronic sinusitis, unspecified: Secondary | ICD-10-CM

## 2012-04-18 MED ORDER — AZITHROMYCIN 250 MG PO TABS: ORAL_TABLET | ORAL | Status: AC

## 2012-04-18 MED ORDER — HYDROCODONE-HOMATROPINE 5-1.5 MG/5ML PO SYRP: 5.0000 mL | ORAL_SOLUTION | ORAL | Status: AC | PRN

## 2012-04-18 NOTE — Progress Notes (Signed)
  Subjective:    Patient ID: [name], female    DOB: 09-15-1950, 61 y.o.   MRN: [id number]  HPI Here for 5 days of sinus pressure, PND, ST, and coughing up green sputum. No fever.    Review of Systems  Constitutional: Negative.   HENT: Positive for congestion, postnasal drip and sinus pressure.   Eyes: Negative.   Respiratory: Positive for cough.        Objective:   Physical Exam  Constitutional: She appears well-developed and well-nourished.  HENT:  Right Ear: External ear normal.  Left Ear: External ear normal.  Nose: Nose normal.  Mouth/Throat: Oropharynx is clear and moist. No oropharyngeal exudate.  Eyes: Conjunctivae normal are normal.  Pulmonary/Chest: Effort normal and breath sounds normal.  Lymphadenopathy:    She has no cervical adenopathy.          Assessment & Plan:  Add Mucinex

## 2012-05-03 ENCOUNTER — Encounter: Payer: Self-pay | Admitting: Internal Medicine

## 2012-05-03 ENCOUNTER — Ambulatory Visit (INDEPENDENT_AMBULATORY_CARE_PROVIDER_SITE_OTHER): Payer: BC Managed Care – PPO | Admitting: Internal Medicine

## 2012-05-03 VITALS — BP 130/74 | HR 83 | Temp 98.0°F | Resp 18 | Wt 167.0 lb

## 2012-05-03 DIAGNOSIS — H6120 Impacted cerumen, unspecified ear: Secondary | ICD-10-CM

## 2012-05-03 DIAGNOSIS — I10 Essential (primary) hypertension: Secondary | ICD-10-CM

## 2012-05-03 DIAGNOSIS — F17200 Nicotine dependence, unspecified, uncomplicated: Secondary | ICD-10-CM

## 2012-05-03 DIAGNOSIS — Z720 Tobacco use: Secondary | ICD-10-CM | POA: Insufficient documentation

## 2012-05-03 DIAGNOSIS — J40 Bronchitis, not specified as acute or chronic: Secondary | ICD-10-CM

## 2012-05-03 MED ORDER — HYDROCODONE-HOMATROPINE 5-1.5 MG/5ML PO SYRP: 5.0000 mL | ORAL_SOLUTION | Freq: Four times a day (QID) | ORAL | Status: AC | PRN

## 2012-05-03 MED ORDER — DOXYCYCLINE HYCLATE 100 MG PO TABS: 100.0000 mg | ORAL_TABLET | Freq: Two times a day (BID) | ORAL | Status: DC

## 2012-05-03 NOTE — Patient Instructions (Signed)
Smoking tobacco is very bad for your health. You should stop smoking immediately.   Take over-the-counter expectorants and cough medications such as  Mucinex DM.  Call if there is no improvement in 5 to 7 days or if he developed worsening cough, fever, or new symptoms, such as shortness of breath or chest pain. 

## 2012-05-03 NOTE — Progress Notes (Signed)
Subjective:    Patient ID: [name], female    DOB: 1950/10/25, 61 y.o.   MRN: [id number]  HPI  61 year old patient who has a history of ongoing tobacco use. She was seen on 12- 9 and treated with azithromycin for an upper respiratory tract infection. She presents today complaining of persistent cough and chest congestion. She has treated hypertension. She is a one half pack per Kleinschmidt smoker. No fever wheezing or shortness of breath.  Past Medical History  Diagnosis Date  . Headache   . Urinary incontinence   . Hypertension     History   Social History  . Marital Status: Married    Spouse Name: N/A    Number of Children: N/A  . Years of Education: N/A   Occupational History  . Not on file.   Social History Main Topics  . Smoking status: Current Every Strauser Smoker -- 0.5 packs/[place]    Types: Cigarettes  . Smokeless tobacco: Never Used     Comment: or less  . Alcohol Use: No  . Drug Use: No  . Sexually Active: Not on file   Other Topics Concern  . Not on file   Social History Narrative  . No narrative on file    Past Surgical History  Procedure Date  . Tuboplasty / tubotubal anastomosis   . Appendectomy   . Abdominal hysterectomy     oophorectomy  . Cervical fusion     and plating    Family History  Problem Relation Age of Onset  . Arthritis    . Breast cancer    . Hypertension      Allergies  Allergen Reactions  . Lisinopril     Tongue swelling  . Penicillins     Current Outpatient Prescriptions on File Prior to Visit  Medication Sig Dispense Refill  . amLODipine (NORVASC) 5 MG tablet Take 1 tablet (5 mg total) by mouth daily.  90 tablet  3  . cyanocobalamin (,VITAMIN B-12,) 1000 MCG/ML injection INJECT 1 ML IM ONCE MONTHLY  10 mL  0  . gabapentin (NEURONTIN) 100 MG capsule Take 1 capsule (100 mg total) by mouth 3 (three) times daily.  270 capsule  3  . HYDROcodone-acetaminophen (NORCO) 10-325 MG per tablet Take 1 tablet by mouth every 6 (six) hours  as needed for pain. pain  120 tablet  5  . B-D 3CC LUER-LOK SYR 25GX5/8" 25G X 5/8" 3 ML MISC USE AS DIRECTED  50 each  0  . metoCLOPramide (REGLAN) 10 MG tablet Take 1 tablet (10 mg total) by mouth every 6 (six) hours.  20 tablet  0  . vitamin B-12 (CYANOCOBALAMIN) 500 MCG tablet Take 500 mcg by mouth 2 (two) times daily.         BP 130/74  Pulse 83  Temp 98 F (36.7 C) (Oral)  Resp 18  Wt 167 lb (75.751 kg)  SpO2 96%      Review of Systems  Constitutional: Negative.   HENT: Positive for congestion and rhinorrhea. Negative for hearing loss, sore throat, dental problem, sinus pressure and tinnitus.   Eyes: Negative for pain, discharge and visual disturbance.  Respiratory: Positive for cough. Negative for shortness of breath.   Cardiovascular: Negative for chest pain, palpitations and leg swelling.  Gastrointestinal: Negative for nausea, vomiting, abdominal pain, diarrhea, constipation, blood in stool and abdominal distention.  Genitourinary: Negative for dysuria, urgency, frequency, hematuria, flank pain, vaginal bleeding, vaginal discharge, difficulty urinating, vaginal pain and pelvic  pain.  Musculoskeletal: Negative for joint swelling, arthralgias and gait problem.  Skin: Negative for rash.  Neurological: Negative for dizziness, syncope, speech difficulty, weakness, numbness and headaches.  Hematological: Negative for adenopathy.  Psychiatric/Behavioral: Negative for behavioral problems, dysphoric mood and agitation. The patient is not nervous/anxious.        Objective:   Physical Exam  Constitutional: She is oriented to person, place, and time. She appears well-developed and well-nourished.  HENT:  Head: Normocephalic.  Right Ear: External ear normal.  Left Ear: External ear normal.  Mouth/Throat: Oropharynx is clear and moist.       Cerumen in both canals  Eyes: Conjunctivae normal and EOM are normal. Pupils are equal, round, and reactive to light.  Neck: Normal range  of motion. Neck supple. No thyromegaly present.  Cardiovascular: Normal rate, regular rhythm, normal heart sounds and intact distal pulses.   Pulmonary/Chest: Effort normal and breath sounds normal.  Abdominal: Soft. Bowel sounds are normal. She exhibits no mass. There is no tenderness.  Musculoskeletal: Normal range of motion.  Lymphadenopathy:    She has no cervical adenopathy.  Neurological: She is alert and oriented to person, place, and time.  Skin: Skin is warm and dry. No rash noted.  Psychiatric: She has a normal mood and affect. Her behavior is normal.          Assessment & Plan:  URI/bronchitis/tobacco abuse. We'll continue symptomatic treatment with the Hydromet. Cessation of smoking encouraged will treat with 10 days of doxycycline./Hypertension stable

## 2012-05-17 ENCOUNTER — Other Ambulatory Visit: Payer: Self-pay | Admitting: Family Medicine

## 2012-05-19 NOTE — Telephone Encounter (Signed)
Call in #120 with 5 rf 

## 2012-09-14 ENCOUNTER — Encounter (HOSPITAL_COMMUNITY): Payer: Self-pay

## 2012-09-14 ENCOUNTER — Emergency Department (HOSPITAL_COMMUNITY)
Admission: EM | Admit: 2012-09-14 | Discharge: 2012-09-14 | Disposition: A | Discharge status: Home / Self Care | Payer: BC Managed Care – PPO | Attending: Emergency Medicine | Admitting: Emergency Medicine

## 2012-09-14 ENCOUNTER — Emergency Department (HOSPITAL_COMMUNITY): Payer: BC Managed Care – PPO

## 2012-09-14 DIAGNOSIS — W19XXXA Unspecified fall, initial encounter: Secondary | ICD-10-CM

## 2012-09-14 DIAGNOSIS — Y939 Activity, unspecified: Secondary | ICD-10-CM | POA: Insufficient documentation

## 2012-09-14 DIAGNOSIS — I10 Essential (primary) hypertension: Secondary | ICD-10-CM | POA: Insufficient documentation

## 2012-09-14 DIAGNOSIS — Z87448 Personal history of other diseases of urinary system: Secondary | ICD-10-CM | POA: Insufficient documentation

## 2012-09-14 DIAGNOSIS — W010XXA Fall on same level from slipping, tripping and stumbling without subsequent striking against object, initial encounter: Secondary | ICD-10-CM | POA: Insufficient documentation

## 2012-09-14 DIAGNOSIS — S46909A Unspecified injury of unspecified muscle, fascia and tendon at shoulder and upper arm level, unspecified arm, initial encounter: Secondary | ICD-10-CM | POA: Insufficient documentation

## 2012-09-14 DIAGNOSIS — Y92009 Unspecified place in unspecified non-institutional (private) residence as the place of occurrence of the external cause: Secondary | ICD-10-CM | POA: Insufficient documentation

## 2012-09-14 DIAGNOSIS — F17200 Nicotine dependence, unspecified, uncomplicated: Secondary | ICD-10-CM | POA: Insufficient documentation

## 2012-09-14 DIAGNOSIS — M25512 Pain in left shoulder: Secondary | ICD-10-CM

## 2012-09-14 DIAGNOSIS — Z79899 Other long term (current) drug therapy: Secondary | ICD-10-CM | POA: Insufficient documentation

## 2012-09-14 DIAGNOSIS — S4980XA Other specified injuries of shoulder and upper arm, unspecified arm, initial encounter: Secondary | ICD-10-CM | POA: Insufficient documentation

## 2012-09-14 MED ORDER — OXYCODONE-ACETAMINOPHEN 5-325 MG PO TABS
2.0000 | ORAL_TABLET | Freq: Once | ORAL | Status: AC
  Administered 2012-09-14: 2 via ORAL
  Filled 2012-09-14: qty 2

## 2012-09-14 NOTE — ED Notes (Signed)
Per pt, fell to ground this am from standing position and landed on left shoulder and knees.  Now unable to lift arm and painful.  No head trauma or LOC.

## 2012-09-14 NOTE — ED Provider Notes (Signed)
Medical screening examination/treatment/procedure(s) were performed by non-physician practitioner and as supervising physician I was immediately available for consultation/collaboration.  Olivia Mackie, MD 09/14/12 [phone]

## 2012-09-14 NOTE — ED Provider Notes (Signed)
History     CSN: [id number]  Arrival date & time 09/14/12  0055   First MD Initiated Contact with Patient 09/14/12 0308      Chief Complaint  Patient presents with  . Shoulder Pain   HPI  History provided by the patient. Patient is a 62 year old female with history of hypertension who presents with complaints of continued left shoulder pain after a fall. Patient reports falling after stumbling in the "ditch" in her backyard at home around 9 AM yesterday morning. Patient fell onto her left side and shoulder area. Since that time she has had pain in the shoulder worse with movements. She did take some ibuprofen during the [place] as well as 10 mg hydrocodone that she had from previous prescription without any significant change in symptoms. She denies any associated weakness or numbness in the hand or fingers. Denies any other injury from the fall. No head injury or LOC. No other aggravating or alleviating factors.    Past Medical History  Diagnosis Date  . Headache   . Urinary incontinence   . Hypertension     Past Surgical History  Procedure Laterality Date  . Tuboplasty / tubotubal anastomosis    . Appendectomy    . Abdominal hysterectomy      oophorectomy  . Cervical fusion      and plating    Family History  Problem Relation Age of Onset  . Arthritis    . Breast cancer    . Hypertension      History  Substance Use Topics  . Smoking status: Current Every [place] Smoker -- 0.50 packs/[place]    Types: Cigarettes  . Smokeless tobacco: Never Used     Comment: or less  . Alcohol Use: No    OB History   Grav Para Term Preterm Abortions TAB SAB Ect Mult Living                  Review of Systems  Neurological: Negative for weakness and numbness.  All other systems reviewed and are negative.    Allergies  Lisinopril and Penicillins  Home Medications   Current Outpatient Rx  Name  Route  Sig  Dispense  Refill  . amLODipine (NORVASC) 5 MG tablet   Oral   Take 1  tablet (5 mg total) by mouth daily.   90 tablet   3   . HYDROcodone-acetaminophen (NORCO) 10-325 MG per tablet      TAKE 1 TABLET BY MOUTH EVERY 6 HOURS AS NEEDED FOR PAIN   120 tablet   5   . Multiple Vitamin (MULTIVITAMIN WITH MINERALS) TABS   Oral   Take 1 tablet by mouth daily.         . vitamin B-12 (CYANOCOBALAMIN) 500 MCG tablet   Oral   Take 500 mcg by mouth 2 (two) times daily.          Marland Kitchen gabapentin (NEURONTIN) 100 MG capsule   Oral   Take 1 capsule (100 mg total) by mouth 3 (three) times daily.   270 capsule   3     BP 141/63  Pulse 95  Temp(Src) 98.6 F (37 C) (Oral)  Resp 18  Ht 5\' 4"  (1.626 m)  Wt 161 lb (73.029 kg)  BMI 27.62 kg/m2  SpO2 97%  Physical Exam  Nursing note and vitals reviewed. Constitutional: She is oriented to person, place, and time. She appears well-developed and well-nourished. No distress.  HENT:  Head: Normocephalic and atraumatic.  Neck: Normal range of motion.  Cardiovascular: Normal rate and regular rhythm.   Pulmonary/Chest: Effort normal and breath sounds normal. No respiratory distress.  Musculoskeletal: She exhibits tenderness. She exhibits no edema.  Reduced range of motion of left shoulder secondary to pain. There is tenderness palpation over the lateral humeral head area and deltoid. No pain over the a.c. joint without deformity. No pain along clavicle no deformity.  Normal distal pulses, sensation and grip strength in hand.  Neurological: She is alert and oriented to person, place, and time.  Skin: Skin is warm and dry. No rash noted.  Psychiatric: She has a normal mood and affect. Her behavior is normal.    ED Course  Procedures   Dg Shoulder Left  09/14/2012  *RADIOLOGY REPORT*  Clinical Data: 61 year old female status post fall with pain.  LEFT SHOULDER - 2+ VIEW  Comparison: None.  Findings: No glenohumeral joint dislocation.  Proximal left humerus remarkable for mild cortical irregularity at the greater  tuberosity.  This might be degenerative, but nondisplaced fracture is difficult to exclude.  Left clavicle and scapula intact.  Visualized left ribs and lung parenchyma within normal limits.  IMPRESSION: 1.  Difficult to exclude nondisplaced fracture of the left humeral head at the greater tuberosity.  Alternatively, this might be degenerative.  Correlate for point tenderness which would corroborate a fracture. 2.  No other acute fracture dislocation about the left shoulder.   Original Report Authenticated By: Odessa Fleming III, M.D.      1. Shoulder pain, acute, left   2. Fall, initial encounter       MDM  4:00AM patient seen and evaluated. Patient resting comfortably appears no significant discomfort or pain.        Angus Seller, PA-C 09/14/12 [phone]

## 2012-10-21 ENCOUNTER — Other Ambulatory Visit (INDEPENDENT_AMBULATORY_CARE_PROVIDER_SITE_OTHER): Payer: BC Managed Care – PPO

## 2012-10-21 DIAGNOSIS — Z0000 Encounter for general adult medical examination without abnormal findings: Secondary | ICD-10-CM

## 2012-10-21 LAB — CBC WITH DIFFERENTIAL/PLATELET
Basophils Relative: 0.4 % (ref 0.0–3.0)
Eosinophils Relative: 1.4 % (ref 0.0–5.0)
HCT: 40.8 % (ref 36.0–46.0)
Hemoglobin: 13.4 g/dL (ref 12.0–15.0)
Lymphs Abs: 3.1 10*3/uL (ref 0.7–4.0)
Monocytes Relative: 6.3 % (ref 3.0–12.0)
Neutro Abs: 9 10*3/uL — ABNORMAL HIGH (ref 1.4–7.7)
Platelets: 302 10*3/uL (ref 150.0–400.0)
RBC: 4.9 Mil/uL (ref 3.87–5.11)
WBC: 13.1 10*3/uL — ABNORMAL HIGH (ref 4.5–10.5)

## 2012-10-21 LAB — BASIC METABOLIC PANEL
BUN: 8 mg/dL (ref 6–23)
Chloride: 106 mEq/L (ref 96–112)
Creatinine, Ser: 0.6 mg/dL (ref 0.4–1.2)
GFR: 117.2 mL/min (ref >60.00)
Glucose, Bld: 77 mg/dL (ref 70–99)
Potassium: 3.9 mEq/L (ref 3.5–5.1)

## 2012-10-21 LAB — LIPID PANEL
LDL Cholesterol: 92 mg/dL (ref 0–99)
VLDL: 18.2 mg/dL (ref 0.0–40.0)

## 2012-10-21 LAB — POCT URINALYSIS DIPSTICK
Glucose, UA: NEGATIVE
Leukocytes, UA: NEGATIVE
Protein, UA: NEGATIVE
Urobilinogen, UA: 1

## 2012-10-21 LAB — TSH: TSH: 0.7 u[IU]/mL (ref 0.35–5.50)

## 2012-10-21 LAB — HEPATIC FUNCTION PANEL: Albumin: 3.7 g/dL (ref 3.5–5.2)

## 2012-10-25 NOTE — Progress Notes (Signed)
Quick Note:  Pt has appointment on 10/26/12 will go over then. ______

## 2012-10-26 ENCOUNTER — Encounter: Payer: Self-pay | Admitting: Family Medicine

## 2012-10-26 ENCOUNTER — Ambulatory Visit (INDEPENDENT_AMBULATORY_CARE_PROVIDER_SITE_OTHER): Payer: BC Managed Care – PPO | Admitting: Family Medicine

## 2012-10-26 VITALS — BP 132/80 | HR 86 | Temp 98.3°F | Ht 64.5 in | Wt 167.0 lb

## 2012-10-26 DIAGNOSIS — H6123 Impacted cerumen, bilateral: Secondary | ICD-10-CM

## 2012-10-26 DIAGNOSIS — H6120 Impacted cerumen, unspecified ear: Secondary | ICD-10-CM

## 2012-10-26 DIAGNOSIS — Z0000 Encounter for general adult medical examination without abnormal findings: Secondary | ICD-10-CM

## 2012-10-26 MED ORDER — AMLODIPINE BESYLATE 5 MG PO TABS: 5.0000 mg | ORAL_TABLET | Freq: Every day | ORAL | Status: DC

## 2012-10-26 MED ORDER — GABAPENTIN 100 MG PO CAPS: 100.0000 mg | ORAL_CAPSULE | Freq: Three times a day (TID) | ORAL | Status: DC

## 2012-10-26 MED ORDER — HYDROCODONE-ACETAMINOPHEN 10-325 MG PO TABS: ORAL_TABLET | ORAL | Status: DC

## 2012-10-26 NOTE — Progress Notes (Signed)
  Subjective:    Patient ID: [name], female    DOB: 1951/04/06, 62 y.o.   MRN: [id number]  HPI 62 yr old female for a cpx. She feels well except for decreased hearing in both ears. She has a hx of cerumen impactions.    Review of Systems  Constitutional: Negative.   HENT: Negative.   Eyes: Negative.   Respiratory: Negative.   Cardiovascular: Negative.   Gastrointestinal: Negative.   Genitourinary: Negative for dysuria, urgency, frequency, hematuria, flank pain, decreased urine volume, enuresis, difficulty urinating, pelvic pain and dyspareunia.  Musculoskeletal: Negative.   Skin: Negative.   Neurological: Negative.   Psychiatric/Behavioral: Negative.        Objective:   Physical Exam  Constitutional: She is oriented to person, place, and time. She appears well-developed and well-nourished. No distress.  HENT:  Head: Normocephalic and atraumatic.  Right Ear: External ear normal.  Left Ear: External ear normal.  Nose: Nose normal.  Mouth/Throat: Oropharynx is clear and moist. No oropharyngeal exudate.  Eyes: Conjunctivae and EOM are normal. Pupils are equal, round, and reactive to light. No scleral icterus.  Neck: Normal range of motion. Neck supple. No JVD present. No thyromegaly present.  Cardiovascular: Normal rate, regular rhythm, normal heart sounds and intact distal pulses.  Exam reveals no gallop and no friction rub.   No murmur heard. Pulmonary/Chest: Effort normal and breath sounds normal. No respiratory distress. She has no wheezes. She has no rales. She exhibits no tenderness.  Abdominal: Soft. Bowel sounds are normal. She exhibits no distension and no mass. There is no tenderness. There is no rebound and no guarding.  Musculoskeletal: Normal range of motion. She exhibits no edema and no tenderness.  Lymphadenopathy:    She has no cervical adenopathy.  Neurological: She is alert and oriented to person, place, and time. She has normal reflexes. No cranial nerve  deficit. She exhibits normal muscle tone. Coordination normal.  Skin: Skin is warm and dry. No rash noted. No erythema.  Psychiatric: She has a normal mood and affect. Her behavior is normal. Judgment and thought content normal.          Assessment & Plan:  Well exam. Both ears were irrigated clear with water.

## 2012-11-17 ENCOUNTER — Telehealth: Payer: Self-pay | Admitting: Family Medicine

## 2012-11-17 NOTE — Telephone Encounter (Signed)
Caller Name: Maygen  Phone: [phone]  Patient: [name], [name]  Gender: Female  DOB: 21-Jun-1950  Age: 61 Years  PCP: [facility])   Does the office need to follow up with this patient?: Yes  Instructions For The Office: no appointment with Dr Clent Ridges, please call  RN Note:  fever has been ~ 99, dry cough.   Reason For Call & Symptoms: cough, congesttion  Reviewed Health History In EMR: Yes  Reviewed Medications In EMR: Yes  Reviewed Allergies In EMR: Yes  Reviewed Surgeries / Procedures: Yes  Date of Onset of Symptoms: 11/15/2012  Guideline(s) Used:  Cough  Disposition Per Guideline:   See Today or Tomorrow in Office  Reason For Disposition Reached:   Patient wants to be seen  Advice Given:  Call Back If:  Difficulty breathing;  Fever lasts > 3 days;  You become worse.  Patient Will Follow Care Advice:  YES

## 2012-11-18 ENCOUNTER — Ambulatory Visit (INDEPENDENT_AMBULATORY_CARE_PROVIDER_SITE_OTHER): Payer: BC Managed Care – PPO | Admitting: Family Medicine

## 2012-11-18 ENCOUNTER — Encounter: Payer: Self-pay | Admitting: Family Medicine

## 2012-11-18 VITALS — BP 120/80 | Temp 98.4°F | Wt 163.0 lb

## 2012-11-18 DIAGNOSIS — J069 Acute upper respiratory infection, unspecified: Secondary | ICD-10-CM

## 2012-11-18 MED ORDER — HYDROCODONE-HOMATROPINE 5-1.5 MG/5ML PO SYRP: 5.0000 mL | ORAL_SOLUTION | Freq: Three times a day (TID) | ORAL | Status: DC | PRN

## 2012-11-18 NOTE — Patient Instructions (Addendum)
INSTRUCTIONS FOR UPPER RESPIRATORY INFECTION:  -nasal saline wash 2-3 times daily (use prepackaged nasal saline or bottled/distilled water if making your own)   -can use sinex nasal spray for drainage and nasal congestion - but do NOT use longer then 3-4 days  -can use tylenol or ibuprofen as directed for aches and sorethroat  -in the winter time, using a humidifier at night is helpful (please follow cleaning instructions)  -if you are taking a cough medication - use only as directed, may also try a teaspoon of honey to coat the throat and throat lozenges  -for sore throat, salt water gargles can help  -follow up if you have fevers, facial pain, tooth pain, difficulty breathing or are worsening

## 2012-11-18 NOTE — Progress Notes (Signed)
Chief Complaint  Patient presents with  . Cough    hurting between shoulder blades    HPI:  Acute visit for cough: -started about 4-5 days ago -symptoms: cough, nasal congestion, sore from coughing, sneezing, itchy nose, scratchy throat, temp 99 4 days ago -denies: fever, tooth pain, ear pain, SOB, wheezing, NV -tried: nothing -sick contacts: yes, work colleagues  ROS: See pertinent positives and negatives per HPI.  Past Medical History  Diagnosis Date  . Headache(784.0)   . Urinary incontinence   . Hypertension     Family History  Problem Relation Age of Onset  . Arthritis    . Breast cancer    . Hypertension      History   Social History  . Marital Status: Married    Spouse Name: N/A    Number of Children: N/A  . Years of Education: N/A   Social History Main Topics  . Smoking status: Current Every [place] Smoker -- 0.50 packs/[place]    Types: Cigarettes  . Smokeless tobacco: Never Used     Comment: or less  . Alcohol Use: No  . Drug Use: No  . Sexually Active: None   Other Topics Concern  . None   Social History Narrative  . None    Current outpatient prescriptions:amLODipine (NORVASC) 5 MG tablet, Take 1 tablet (5 mg total) by mouth daily., Disp: 90 tablet, Rfl: 3;  gabapentin (NEURONTIN) 100 MG capsule, Take 1 capsule (100 mg total) by mouth 3 (three) times daily., Disp: 270 capsule, Rfl: 3;  HYDROcodone-acetaminophen (NORCO) 10-325 MG per tablet, TAKE 1 TABLET BY MOUTH EVERY 6 HOURS AS NEEDED FOR PAIN, Disp: 120 tablet, Rfl: 5 Multiple Vitamin (MULTIVITAMIN WITH MINERALS) TABS, Take 1 tablet by mouth daily., Disp: , Rfl: ;  vitamin B-12 (CYANOCOBALAMIN) 500 MCG tablet, Take 500 mcg by mouth 2 (two) times daily. , Disp: , Rfl: ;  HYDROcodone-homatropine (HYCODAN) 5-1.5 MG/5ML syrup, Take 5 mLs by mouth every 8 (eight) hours as needed for cough., Disp: 120 mL, Rfl: 0  EXAM:  Filed Vitals:   11/18/12 0805  BP: 120/80  Temp: 98.4 F (36.9 C)    Body mass  index is 27.56 kg/(m^2).  GENERAL: vitals reviewed and listed above, alert, oriented, appears well hydrated and in no acute distress  HEENT: atraumatic, conjunttiva clear, no obvious abnormalities on inspection of external nose and ears, normal appearance of ear canals and TMs, clear nasal congestion, mild post oropharyngeal erythema with PND, no tonsillar edema or exudate, no sinus TTP  NECK: no obvious masses on inspection  LUNGS: clear to auscultation bilaterally, no wheezes, rales or rhonchi, good air movement  CV: HRRR, no peripheral edema  MS: moves all extremities without noticeable abnormality  PSYCH: pleasant and cooperative, no obvious depression or anxiety  ASSESSMENT AND PLAN:  Discussed the following assessment and plan:  Upper respiratory infection - Plan: HYDROcodone-homatropine (HYCODAN) 5-1.5 MG/5ML syrup  -viral upper resp inf likely - advised supportive care and return precautions, cough medication provided - risks discussed -Patient advised to return or notify a doctor immediately if symptoms worsen or persist or new concerns arise.  Patient Instructions  INSTRUCTIONS FOR UPPER RESPIRATORY INFECTION:  -nasal saline wash 2-3 times daily (use prepackaged nasal saline or bottled/distilled water if making your own)   -can use sinex nasal spray for drainage and nasal congestion - but do NOT use longer then 3-4 days  -can use tylenol or ibuprofen as directed for aches and sorethroat  -in the  winter time, using a humidifier at night is helpful (please follow cleaning instructions)  -if you are taking a cough medication - use only as directed, may also try a teaspoon of honey to coat the throat and throat lozenges  -for sore throat, salt water gargles can help  -follow up if you have fevers, facial pain, tooth pain, difficulty breathing or are worsening       KIM, HANNAH R.

## 2013-02-24 ENCOUNTER — Telehealth: Payer: Self-pay | Admitting: Family Medicine

## 2013-02-24 NOTE — Telephone Encounter (Addendum)
Pt needs new rx hydrocodone. Pt will need rx by end of month

## 2013-02-28 MED ORDER — HYDROCODONE-ACETAMINOPHEN 10-325 MG PO TABS: ORAL_TABLET | ORAL | Status: DC

## 2013-02-28 NOTE — Telephone Encounter (Signed)
Script is ready for pick up and I left a voice message.  

## 2013-02-28 NOTE — Telephone Encounter (Signed)
done

## 2013-03-16 ENCOUNTER — Other Ambulatory Visit: Payer: Self-pay

## 2013-04-03 ENCOUNTER — Telehealth: Payer: Self-pay | Admitting: Family Medicine

## 2013-04-03 MED ORDER — AMLODIPINE BESYLATE 5 MG PO TABS: 5.0000 mg | ORAL_TABLET | Freq: Every day | ORAL | Status: DC

## 2013-04-03 MED ORDER — GABAPENTIN 100 MG PO CAPS: 100.0000 mg | ORAL_CAPSULE | Freq: Three times a day (TID) | ORAL | Status: DC

## 2013-04-03 NOTE — Telephone Encounter (Signed)
I sent both scripts e-scribe. 

## 2013-04-03 NOTE — Telephone Encounter (Signed)
Refill request for Amolodipine & Gabapentin, send a 90 [address] supply to [facility].

## 2013-04-04 ENCOUNTER — Encounter: Payer: Self-pay | Admitting: Family Medicine

## 2013-04-05 ENCOUNTER — Encounter: Payer: Self-pay | Admitting: Family Medicine

## 2013-04-05 ENCOUNTER — Ambulatory Visit (INDEPENDENT_AMBULATORY_CARE_PROVIDER_SITE_OTHER): Payer: 59 | Admitting: Family Medicine

## 2013-04-05 VITALS — BP 150/80 | HR 97 | Temp 98.3°F | Wt 180.0 lb

## 2013-04-05 DIAGNOSIS — R51 Headache: Secondary | ICD-10-CM

## 2013-04-05 DIAGNOSIS — I10 Essential (primary) hypertension: Secondary | ICD-10-CM

## 2013-04-05 DIAGNOSIS — R609 Edema, unspecified: Secondary | ICD-10-CM

## 2013-04-05 MED ORDER — MELOXICAM 15 MG PO TABS: 15.0000 mg | ORAL_TABLET | Freq: Every day | ORAL | Status: DC

## 2013-04-05 MED ORDER — FUROSEMIDE 20 MG PO TABS: 20.0000 mg | ORAL_TABLET | Freq: Every day | ORAL | Status: DC

## 2013-04-05 MED ORDER — GABAPENTIN 300 MG PO CAPS: 300.0000 mg | ORAL_CAPSULE | Freq: Three times a day (TID) | ORAL | Status: DC

## 2013-04-05 NOTE — Progress Notes (Signed)
   Subjective:    Patient ID: [name], female    DOB: [birth year], 61 y.o.   MRN: [id number]  HPI Here for several things. First for one month she has had frequent HAs in both temples. These are dull but not severe. They last 3-4 days at a time. No other neurologic sx. She recently had an eye exam and had a new rx made for eyeglasses. She admits to chewing gum all [place] every Riss. Also she has had some swelling in both feet for a few months. No SOB.    Review of Systems  Constitutional: Negative.   Eyes: Negative.   Respiratory: Negative.   Cardiovascular: Positive for leg swelling. Negative for chest pain and palpitations.  Neurological: Positive for headaches. Negative for dizziness, tremors, seizures, syncope, facial asymmetry, speech difficulty, weakness, light-headedness and numbness.       Objective:   Physical Exam  Constitutional: She is oriented to person, place, and time. She appears well-developed and well-nourished. No distress.  HENT:  Head: Normocephalic and atraumatic.  Tender over both temples  Eyes: Conjunctivae are normal. Pupils are equal, round, and reactive to light.  Cardiovascular: Normal rate, regular rhythm, normal heart sounds and intact distal pulses.   Pulmonary/Chest: Effort normal and breath sounds normal.  Musculoskeletal:  2+ edema in both feet   Neurological: She is alert and oriented to person, place, and time. She has normal reflexes. No cranial nerve deficit. She exhibits normal muscle tone. Coordination normal.          Assessment & Plan:  She is having tension HAs. I advised her to stop chewing gum. She needs to see her dentist to evaluate her bite alignment. Increase Gabapentin to 300 mg tid. Add Meloxicam. Her ankle edema is likely a side effect of the Amlodipine so we will add Lasix 20 mg daily.

## 2013-04-05 NOTE — Progress Notes (Signed)
Pre visit review using our clinic review tool, if applicable. No additional management support is needed unless otherwise documented below in the visit note. 

## 2013-05-31 ENCOUNTER — Ambulatory Visit (INDEPENDENT_AMBULATORY_CARE_PROVIDER_SITE_OTHER): Payer: 59 | Admitting: Family Medicine

## 2013-05-31 ENCOUNTER — Encounter: Payer: Self-pay | Admitting: Family Medicine

## 2013-05-31 VITALS — BP 142/80 | HR 91 | Temp 98.3°F | Ht 64.5 in | Wt 186.0 lb

## 2013-05-31 DIAGNOSIS — M5030 Other cervical disc degeneration, unspecified cervical region: Secondary | ICD-10-CM

## 2013-05-31 DIAGNOSIS — I10 Essential (primary) hypertension: Secondary | ICD-10-CM

## 2013-05-31 DIAGNOSIS — R51 Headache: Secondary | ICD-10-CM

## 2013-05-31 DIAGNOSIS — R609 Edema, unspecified: Secondary | ICD-10-CM

## 2013-05-31 MED ORDER — HYDROCODONE-ACETAMINOPHEN 10-325 MG PO TABS: ORAL_TABLET | ORAL | Status: DC

## 2013-05-31 MED ORDER — GABAPENTIN 300 MG PO CAPS: 300.0000 mg | ORAL_CAPSULE | Freq: Three times a day (TID) | ORAL | Status: DC

## 2013-05-31 NOTE — Progress Notes (Signed)
Pre visit review using our clinic review tool, if applicable. No additional management support is needed unless otherwise documented below in the visit note. 

## 2013-06-01 ENCOUNTER — Encounter: Payer: Self-pay | Admitting: Family Medicine

## 2013-06-01 NOTE — Progress Notes (Signed)
   Subjective:    Patient ID: [name], female    DOB: 22-Jun-1950, 63 y.o.   MRN: [id number]  HPI Here for follow up. She is doing well with pain control and her BP is stable.    Review of Systems  Constitutional: Negative.   Respiratory: Negative.   Cardiovascular: Negative.   Musculoskeletal: Positive for back pain and myalgias.       Objective:   Physical Exam  Constitutional: She appears well-developed and well-nourished.  Cardiovascular: Normal rate, regular rhythm, normal heart sounds and intact distal pulses.   Pulmonary/Chest: Effort normal and breath sounds normal.          Assessment & Plan:  Refilled meds

## 2013-06-13 ENCOUNTER — Telehealth: Payer: Self-pay | Admitting: Family Medicine

## 2013-06-13 NOTE — Telephone Encounter (Signed)
Relevant patient education assigned to patient using Emmi. ° °

## 2013-06-21 ENCOUNTER — Encounter: Payer: Self-pay | Admitting: Family Medicine

## 2013-07-31 ENCOUNTER — Other Ambulatory Visit: Payer: Self-pay | Admitting: Family Medicine

## 2013-08-15 ENCOUNTER — Encounter: Payer: Self-pay | Admitting: Family Medicine

## 2013-08-23 ENCOUNTER — Encounter: Payer: Self-pay | Admitting: Family Medicine

## 2013-08-23 MED ORDER — HYDROCODONE-ACETAMINOPHEN 10-325 MG PO TABS: ORAL_TABLET | ORAL | Status: DC

## 2013-08-23 NOTE — Telephone Encounter (Signed)
done

## 2013-08-23 NOTE — Telephone Encounter (Signed)
Done today.

## 2013-10-02 ENCOUNTER — Other Ambulatory Visit: Payer: Self-pay | Admitting: Family Medicine

## 2013-11-20 ENCOUNTER — Encounter: Payer: Self-pay | Admitting: Family Medicine

## 2013-11-20 ENCOUNTER — Other Ambulatory Visit: Payer: Self-pay | Admitting: Family Medicine

## 2013-11-22 ENCOUNTER — Encounter: Payer: Self-pay | Admitting: Family Medicine

## 2013-11-22 ENCOUNTER — Ambulatory Visit (INDEPENDENT_AMBULATORY_CARE_PROVIDER_SITE_OTHER): Payer: 59 | Admitting: Family Medicine

## 2013-11-22 VITALS — BP 141/81 | HR 88 | Temp 98.8°F | Ht 64.5 in | Wt 190.0 lb

## 2013-11-22 DIAGNOSIS — I10 Essential (primary) hypertension: Secondary | ICD-10-CM

## 2013-11-22 DIAGNOSIS — R609 Edema, unspecified: Secondary | ICD-10-CM

## 2013-11-22 DIAGNOSIS — M5030 Other cervical disc degeneration, unspecified cervical region: Secondary | ICD-10-CM

## 2013-11-22 MED ORDER — HYDROCODONE-ACETAMINOPHEN 10-325 MG PO TABS: ORAL_TABLET | ORAL | Status: DC

## 2013-11-22 MED ORDER — FUROSEMIDE 20 MG PO TABS: 20.0000 mg | ORAL_TABLET | Freq: Every day | ORAL | Status: DC

## 2013-11-22 NOTE — Progress Notes (Signed)
Pre visit review using our clinic review tool, if applicable. No additional management support is needed unless otherwise documented below in the visit note. 

## 2013-11-22 NOTE — Telephone Encounter (Signed)
rx are done, please set her up an appt

## 2013-11-22 NOTE — Progress Notes (Signed)
   Subjective:    Patient ID: [name], female    DOB: 30-Apr-1951, 63 y.o.   MRN: [id number]  HPI Here to follow up. Her neck pain is stable. Her BP is stable but she has had more feet swelling than usual. No SOB. She takes Lasix. She sits all [place] on her job.    Review of Systems  Constitutional: Negative.   Respiratory: Negative.   Cardiovascular: Positive for leg swelling. Negative for chest pain and palpitations.       Objective:   Physical Exam  Constitutional: She appears well-developed and well-nourished.  Cardiovascular: Normal rate, regular rhythm, normal heart sounds and intact distal pulses.   Pulmonary/Chest: Effort normal and breath sounds normal.  Musculoskeletal:  2+ edema in both feet and lower legs           Assessment & Plan:  Try knee high compression stockings.

## 2014-01-10 ENCOUNTER — Emergency Department (HOSPITAL_COMMUNITY): Payer: 59

## 2014-01-10 ENCOUNTER — Encounter (HOSPITAL_COMMUNITY): Payer: Self-pay | Admitting: Emergency Medicine

## 2014-01-10 ENCOUNTER — Emergency Department (HOSPITAL_COMMUNITY)
Admission: EM | Admit: 2014-01-10 | Discharge: 2014-01-11 | Disposition: A | Discharge status: Home / Self Care | Payer: 59 | Attending: Emergency Medicine | Admitting: Emergency Medicine

## 2014-01-10 DIAGNOSIS — Z880 Allergy status to penicillin: Secondary | ICD-10-CM | POA: Insufficient documentation

## 2014-01-10 DIAGNOSIS — L03019 Cellulitis of unspecified finger: Secondary | ICD-10-CM | POA: Insufficient documentation

## 2014-01-10 DIAGNOSIS — F17200 Nicotine dependence, unspecified, uncomplicated: Secondary | ICD-10-CM | POA: Diagnosis not present

## 2014-01-10 DIAGNOSIS — Z79899 Other long term (current) drug therapy: Secondary | ICD-10-CM | POA: Diagnosis not present

## 2014-01-10 DIAGNOSIS — S6990XA Unspecified injury of unspecified wrist, hand and finger(s), initial encounter: Secondary | ICD-10-CM | POA: Insufficient documentation

## 2014-01-10 DIAGNOSIS — IMO0001 Reserved for inherently not codable concepts without codable children: Secondary | ICD-10-CM

## 2014-01-10 DIAGNOSIS — G589 Mononeuropathy, unspecified: Secondary | ICD-10-CM | POA: Diagnosis not present

## 2014-01-10 DIAGNOSIS — I10 Essential (primary) hypertension: Secondary | ICD-10-CM | POA: Insufficient documentation

## 2014-01-10 DIAGNOSIS — Y929 Unspecified place or not applicable: Secondary | ICD-10-CM | POA: Insufficient documentation

## 2014-01-10 DIAGNOSIS — S6980XA Other specified injuries of unspecified wrist, hand and finger(s), initial encounter: Secondary | ICD-10-CM | POA: Diagnosis not present

## 2014-01-10 DIAGNOSIS — Y9389 Activity, other specified: Secondary | ICD-10-CM | POA: Insufficient documentation

## 2014-01-10 DIAGNOSIS — L609 Nail disorder, unspecified: Secondary | ICD-10-CM | POA: Insufficient documentation

## 2014-01-10 DIAGNOSIS — W230XXA Caught, crushed, jammed, or pinched between moving objects, initial encounter: Secondary | ICD-10-CM | POA: Diagnosis not present

## 2014-01-10 HISTORY — DX: Polyneuropathy, unspecified: G62.9

## 2014-01-10 NOTE — ED Notes (Signed)
Pt states that she haas been moving and jammed her finger over the last few weeks; pt c/o tenderness, swelling, and pain to left middle finger

## 2014-01-10 NOTE — ED Provider Notes (Signed)
CSN: [id number]     Arrival date & time 01/10/14  2019 History  This chart was scribed for non-physician practitioner, Alvina Chou, PA-C working with Kalman Drape, MD by Einar Pheasant, ED scribe. This patient was seen in room WTR6/WTR6 and the patient's care was started at 11:52 PM.     Chief Complaint  Patient presents with  . Nail Problem   The history is provided by the patient. No language interpreter was used.   HPI Comments: [name] is a 63 y.o. female who presents to the Emergency Department complaining of paronychia to left middle finger that she first noticed today. Pt  States that she jammed her affected finger some time ago, but today she noticed some associated redness, pain, and swelling. Pt denies any fever, chills, nausea, emesis, SOB, chest pain, weakness, or numbness.  Past Medical History  Diagnosis Date  . Headache(784.0)   . Urinary incontinence   . Hypertension   . Neuropathy    Past Surgical History  Procedure Laterality Date  . Tuboplasty / tubotubal anastomosis    . Appendectomy    . Abdominal hysterectomy      oophorectomy  . Cervical fusion      and plating  . Colonoscopy  never    she declines to have one    Family History  Problem Relation Age of Onset  . Arthritis    . Breast cancer    . Hypertension     History  Substance Use Topics  . Smoking status: Current Every [place] Smoker -- 0.50 packs/[place]    Types: Cigarettes  . Smokeless tobacco: Never Used     Comment: or less  . Alcohol Use: No   OB History   Grav Para Term Preterm Abortions TAB SAB Ect Mult Living                 Review of Systems  Constitutional: Negative for fever and chills.  HENT: Negative for congestion.   Respiratory: Negative for cough and shortness of breath.   Gastrointestinal: Negative for nausea, vomiting and abdominal pain.  Musculoskeletal: Positive for joint swelling and myalgias.  Skin: Positive for color change.  Neurological: Negative for  dizziness, weakness, numbness and headaches.   Allergies  Lisinopril and Penicillins  Home Medications   Prior to Admission medications   Medication Sig Start Date End Date Taking? Authorizing Provider  amLODipine (NORVASC) 5 MG tablet Take 1 tablet by mouth  daily   Yes Laurey Morale, MD  furosemide (LASIX) 20 MG tablet Take 1 tablet (20 mg total) by mouth daily. 11/22/13  Yes Laurey Morale, MD  gabapentin (NEURONTIN) 300 MG capsule Take 1 capsule (300 mg total) by mouth 3 (three) times daily. 05/31/13  Yes Laurey Morale, MD  HYDROcodone-acetaminophen ([place]ony) 10-325 MG per tablet TAKE 1 TABLET BY MOUTH EVERY 6 HOURS AS NEEDED FOR PAIN 11/22/13  Yes Laurey Morale, MD  ibuprofen (ADVIL,MOTRIN) 200 MG tablet Take 200 mg by mouth every 6 (six) hours as needed.   Yes Historical Provider, MD  Multiple Vitamin (MULTIVITAMIN WITH MINERALS) TABS Take 1 tablet by mouth daily.   Yes Historical Provider, MD  vitamin B-12 (CYANOCOBALAMIN) 500 MCG tablet Take 500 mcg by mouth 2 (two) times daily.    Yes Historical Provider, MD   BP 141/79  Pulse 74  Temp(Src) 98.3 F (36.8 C) (Oral)  Resp 16  SpO2 94%  Physical Exam  Nursing note and vitals reviewed. Constitutional: She  is oriented to person, place, and time. She appears well-developed and well-nourished. No distress.  HENT:  Head: Normocephalic and atraumatic.  Eyes: Conjunctivae and EOM are normal.  Neck: Neck supple.  Cardiovascular: Normal rate.   Pulmonary/Chest: Effort normal. No respiratory distress.  Musculoskeletal: Normal range of motion.  Neurological: She is alert and oriented to person, place, and time.  Skin: Skin is warm and dry.  Left middle finger edema and subcutaneous pus around nailbed that is tender to palpation.   Psychiatric: She has a normal mood and affect. Her behavior is normal.    ED Course  Drain paronychia Date/Time: 01/11/2014 12:04 AM Performed by: Alvina Chou Authorized by: Alvina Chou Consent: Verbal consent obtained. Consent given by: patient Patient understanding: patient states understanding of the procedure being performed Patient consent: the patient's understanding of the procedure matches consent given Patient identity confirmed: verbally with patient Local anesthesia used: no Patient sedated: no Patient tolerance: Patient tolerated the procedure well with no immediate complications.   (including critical care time)  DIAGNOSTIC STUDIES: Oxygen Saturation is 94% on RA, normal by my interpretation.    COORDINATION OF CARE: 11:56 PM- Pt advised of plan for treatment and pt agrees.  Imaging Review Dg Finger Middle Left  01/10/2014   CLINICAL DATA:  NAIL PROBLEM. Area around the nail bed of the left middle finger is discolored and tender, unknown etiology.  EXAM: LEFT MIDDLE FINGER 2+V  COMPARISON:  None.  FINDINGS: No displaced fracture or dislocation. No aggressive osseous lesion. No radiopaque foreign body.  IMPRESSION: No acute or aggressive osseous finding of the third digit left hand.   Electronically Signed   By: Carlos Levering M.D.   On: 01/10/2014 22:47    MDM   Final diagnoses:  Paronychia of third finger, left    Paronychia drained and bandaged with bacitracin ointment. Patient will be discharged with bactrim for surrounding infection. Vitals stable and patient afebrile.   I personally performed the services described in this documentation, which was scribed in my presence. The recorded information has been reviewed and is accurate.    Alvina Chou, PA-C 01/11/14 [phone]

## 2014-01-11 MED ORDER — SULFAMETHOXAZOLE-TRIMETHOPRIM 800-160 MG PO TABS: 1.0000 | ORAL_TABLET | Freq: Two times a day (BID) | ORAL | Status: DC

## 2014-01-11 NOTE — Discharge Instructions (Signed)
Take Bactrim as directed until gone. Keep wound area clean. Follow up with your doctor as needed.

## 2014-01-11 NOTE — ED Provider Notes (Signed)
Medical screening examination/treatment/procedure(s) were performed by non-physician practitioner and as supervising physician I was immediately available for consultation/collaboration.   EKG Interpretation None       Kalman Drape, MD 01/11/14 [phone]

## 2014-01-30 ENCOUNTER — Encounter: Payer: Self-pay | Admitting: Family Medicine

## 2014-01-31 MED ORDER — GABAPENTIN 300 MG PO CAPS: 300.0000 mg | ORAL_CAPSULE | Freq: Three times a day (TID) | ORAL | Status: DC

## 2014-01-31 NOTE — Telephone Encounter (Signed)
I printed out the rx for Gabapentin to fax in, but the hydrocodone refills are not due until October 15

## 2014-02-19 ENCOUNTER — Encounter: Payer: Self-pay | Admitting: Family Medicine

## 2014-02-20 MED ORDER — HYDROCODONE-ACETAMINOPHEN 10-325 MG PO TABS: ORAL_TABLET | ORAL | Status: DC

## 2014-02-20 NOTE — Telephone Encounter (Signed)
done

## 2014-05-01 ENCOUNTER — Other Ambulatory Visit: Payer: Self-pay | Admitting: Family Medicine

## 2014-05-08 ENCOUNTER — Other Ambulatory Visit: Payer: Self-pay | Admitting: Family Medicine

## 2014-05-16 ENCOUNTER — Encounter: Payer: Self-pay | Admitting: Family Medicine

## 2014-05-18 MED ORDER — HYDROCODONE-ACETAMINOPHEN 10-325 MG PO TABS: ORAL_TABLET | ORAL | Status: DC

## 2014-05-18 NOTE — Telephone Encounter (Signed)
done

## 2014-07-02 ENCOUNTER — Encounter (HOSPITAL_BASED_OUTPATIENT_CLINIC_OR_DEPARTMENT_OTHER): Payer: Self-pay | Admitting: *Deleted

## 2014-07-02 ENCOUNTER — Other Ambulatory Visit: Payer: Self-pay | Admitting: Orthopedic Surgery

## 2014-07-03 ENCOUNTER — Other Ambulatory Visit: Payer: Self-pay

## 2014-07-03 ENCOUNTER — Encounter (HOSPITAL_BASED_OUTPATIENT_CLINIC_OR_DEPARTMENT_OTHER)
Admission: RE | Admit: 2014-07-03 | Discharge: 2014-07-03 | Disposition: A | Discharge status: Home / Self Care | Payer: 59 | Source: Ambulatory Visit | Attending: Orthopedic Surgery | Admitting: Orthopedic Surgery

## 2014-07-03 ENCOUNTER — Other Ambulatory Visit: Payer: Self-pay | Admitting: Family Medicine

## 2014-07-03 DIAGNOSIS — M67843 Other specified disorders of tendon, right hand: Secondary | ICD-10-CM | POA: Diagnosis not present

## 2014-07-03 DIAGNOSIS — Z880 Allergy status to penicillin: Secondary | ICD-10-CM | POA: Diagnosis not present

## 2014-07-03 DIAGNOSIS — Z888 Allergy status to other drugs, medicaments and biological substances status: Secondary | ICD-10-CM | POA: Diagnosis not present

## 2014-07-03 DIAGNOSIS — F17210 Nicotine dependence, cigarettes, uncomplicated: Secondary | ICD-10-CM | POA: Diagnosis not present

## 2014-07-03 DIAGNOSIS — I10 Essential (primary) hypertension: Secondary | ICD-10-CM | POA: Diagnosis not present

## 2014-07-03 DIAGNOSIS — M65351 Trigger finger, right little finger: Secondary | ICD-10-CM | POA: Diagnosis not present

## 2014-07-03 LAB — BASIC METABOLIC PANEL
Anion gap: 6 (ref 5–15)
BUN: 10 mg/dL (ref 6–23)
CO2: 25 mmol/L (ref 19–32)
CREATININE: 0.72 mg/dL (ref 0.50–1.10)
Calcium: 8.9 mg/dL (ref 8.4–10.5)
Chloride: 106 mmol/L (ref 96–112)
GFR calc Af Amer: >90 mL/min (ref >90)
GFR calc non Af Amer: >90 mL/min (ref >90)
GLUCOSE: 109 mg/dL — AB (ref 70–99)
Potassium: 4.5 mmol/L (ref 3.5–5.1)
Sodium: 137 mmol/L (ref 135–145)

## 2014-07-05 ENCOUNTER — Encounter (HOSPITAL_BASED_OUTPATIENT_CLINIC_OR_DEPARTMENT_OTHER): Payer: Self-pay | Admitting: *Deleted

## 2014-07-05 ENCOUNTER — Ambulatory Visit (HOSPITAL_BASED_OUTPATIENT_CLINIC_OR_DEPARTMENT_OTHER): Payer: 59 | Admitting: Anesthesiology

## 2014-07-05 ENCOUNTER — Ambulatory Visit (HOSPITAL_BASED_OUTPATIENT_CLINIC_OR_DEPARTMENT_OTHER)
Admission: RE | Admit: 2014-07-05 | Discharge: 2014-07-05 | Disposition: A | Discharge status: Home / Self Care | Payer: 59 | Source: Ambulatory Visit | Attending: Orthopedic Surgery | Admitting: Orthopedic Surgery

## 2014-07-05 ENCOUNTER — Encounter (HOSPITAL_BASED_OUTPATIENT_CLINIC_OR_DEPARTMENT_OTHER)
Admission: RE | Disposition: A | Discharge status: Home / Self Care | Payer: Self-pay | Source: Ambulatory Visit | Attending: Orthopedic Surgery

## 2014-07-05 DIAGNOSIS — M65351 Trigger finger, right little finger: Secondary | ICD-10-CM | POA: Insufficient documentation

## 2014-07-05 DIAGNOSIS — M67843 Other specified disorders of tendon, right hand: Secondary | ICD-10-CM | POA: Insufficient documentation

## 2014-07-05 DIAGNOSIS — I10 Essential (primary) hypertension: Secondary | ICD-10-CM | POA: Insufficient documentation

## 2014-07-05 DIAGNOSIS — Z888 Allergy status to other drugs, medicaments and biological substances status: Secondary | ICD-10-CM | POA: Insufficient documentation

## 2014-07-05 DIAGNOSIS — Z880 Allergy status to penicillin: Secondary | ICD-10-CM | POA: Insufficient documentation

## 2014-07-05 DIAGNOSIS — F17210 Nicotine dependence, cigarettes, uncomplicated: Secondary | ICD-10-CM | POA: Insufficient documentation

## 2014-07-05 HISTORY — PX: EAR CYST EXCISION: SHX22

## 2014-07-05 HISTORY — PX: TRIGGER FINGER RELEASE: SHX641

## 2014-07-05 HISTORY — DX: Presence of dental prosthetic device (complete) (partial): Z97.2

## 2014-07-05 HISTORY — DX: Presence of spectacles and contact lenses: Z97.3

## 2014-07-05 SURGERY — CYST REMOVAL
Anesthesia: General | Site: Finger | Laterality: Right

## 2014-07-05 MED ORDER — ONDANSETRON HCL 4 MG/2ML IJ SOLN
INTRAMUSCULAR | Status: DC | PRN
  Administered 2014-07-05: 4 mg via INTRAVENOUS

## 2014-07-05 MED ORDER — MIDAZOLAM HCL 5 MG/5ML IJ SOLN
INTRAMUSCULAR | Status: DC | PRN
  Administered 2014-07-05: 2 mg via INTRAVENOUS

## 2014-07-05 MED ORDER — FENTANYL CITRATE 0.05 MG/ML IJ SOLN: 50.0000 ug | INTRAMUSCULAR | Status: DC | PRN

## 2014-07-05 MED ORDER — EPHEDRINE SULFATE 50 MG/ML IJ SOLN
INTRAMUSCULAR | Status: DC | PRN
  Administered 2014-07-05: 10 mg via INTRAVENOUS

## 2014-07-05 MED ORDER — FENTANYL CITRATE 0.05 MG/ML IJ SOLN
INTRAMUSCULAR | Status: DC | PRN
  Administered 2014-07-05: 100 ug via INTRAVENOUS

## 2014-07-05 MED ORDER — HYDROCODONE-ACETAMINOPHEN 5-325 MG PO TABS
ORAL_TABLET | ORAL | Status: DC
Start: 2014-07-05 — End: 2014-07-24

## 2014-07-05 MED ORDER — PROMETHAZINE HCL 25 MG/ML IJ SOLN
6.2500 mg | INTRAMUSCULAR | Status: DC | PRN
Start: 2014-07-05 — End: 2014-07-05

## 2014-07-05 MED ORDER — FENTANYL CITRATE 0.05 MG/ML IJ SOLN
25.0000 ug | INTRAMUSCULAR | Status: DC | PRN
  Administered 2014-07-05: 50 ug via INTRAVENOUS

## 2014-07-05 MED ORDER — LACTATED RINGERS IV SOLN
INTRAVENOUS | Status: DC
  Administered 2014-07-05: 09:00:00 via INTRAVENOUS

## 2014-07-05 MED ORDER — KETOROLAC TROMETHAMINE 30 MG/ML IJ SOLN
30.0000 mg | Freq: Once | INTRAMUSCULAR | Status: DC | PRN
Start: 2014-07-05 — End: 2014-07-05

## 2014-07-05 MED ORDER — KETOROLAC TROMETHAMINE 30 MG/ML IJ SOLN
INTRAMUSCULAR | Status: DC | PRN
  Administered 2014-07-05: 30 mg via INTRAVENOUS

## 2014-07-05 MED ORDER — VANCOMYCIN HCL IN DEXTROSE 1-5 GM/200ML-% IV SOLN
INTRAVENOUS | Status: AC
  Filled 2014-07-05: qty 200

## 2014-07-05 MED ORDER — DEXAMETHASONE SODIUM PHOSPHATE 4 MG/ML IJ SOLN
INTRAMUSCULAR | Status: DC | PRN
  Administered 2014-07-05: 10 mg via INTRAVENOUS

## 2014-07-05 MED ORDER — LIDOCAINE HCL (CARDIAC) 20 MG/ML IV SOLN
INTRAVENOUS | Status: DC | PRN
  Administered 2014-07-05: 60 mg via INTRAVENOUS

## 2014-07-05 MED ORDER — MIDAZOLAM HCL 2 MG/2ML IJ SOLN
INTRAMUSCULAR | Status: AC
  Filled 2014-07-05: qty 2

## 2014-07-05 MED ORDER — CHLORHEXIDINE GLUCONATE 4 % EX LIQD: 60.0000 mL | Freq: Once | CUTANEOUS | Status: DC

## 2014-07-05 MED ORDER — FENTANYL CITRATE 0.05 MG/ML IJ SOLN
INTRAMUSCULAR | Status: AC
  Filled 2014-07-05: qty 4

## 2014-07-05 MED ORDER — PROPOFOL 10 MG/ML IV BOLUS
INTRAVENOUS | Status: DC | PRN
  Administered 2014-07-05: 180 mg via INTRAVENOUS

## 2014-07-05 MED ORDER — VANCOMYCIN HCL IN DEXTROSE 1-5 GM/200ML-% IV SOLN
1000.0000 mg | INTRAVENOUS | Status: AC
  Administered 2014-07-05: 1000 mg via INTRAVENOUS

## 2014-07-05 MED ORDER — MIDAZOLAM HCL 2 MG/2ML IJ SOLN: 1.0000 mg | INTRAMUSCULAR | Status: DC | PRN

## 2014-07-05 MED ORDER — FENTANYL CITRATE 0.05 MG/ML IJ SOLN
INTRAMUSCULAR | Status: AC
  Filled 2014-07-05: qty 2

## 2014-07-05 MED ORDER — BUPIVACAINE HCL (PF) 0.25 % IJ SOLN
INTRAMUSCULAR | Status: DC | PRN
  Administered 2014-07-05: 5 mL

## 2014-07-05 SURGICAL SUPPLY — 58 items
APL SKNCLS STERI-STRIP NONHPOA (GAUZE/BANDAGES/DRESSINGS)
BANDAGE COBAN STERILE 2 (GAUZE/BANDAGES/DRESSINGS) ×3 IMPLANT
BANDAGE ELASTIC 3 VELCRO ST LF (GAUZE/BANDAGES/DRESSINGS) IMPLANT
BENZOIN TINCTURE PRP APPL 2/3 (GAUZE/BANDAGES/DRESSINGS) IMPLANT
BLADE MINI RND TIP GREEN BEAV (BLADE) IMPLANT
BLADE SURG 15 STRL LF DISP TIS (BLADE) ×2 IMPLANT
BLADE SURG 15 STRL SS (BLADE) ×6
BNDG CMPR 9X4 STRL LF SNTH (GAUZE/BANDAGES/DRESSINGS) ×1
BNDG CMPR MD 5X2 ELC HKLP STRL (GAUZE/BANDAGES/DRESSINGS)
BNDG COHESIVE 1X5 TAN STRL LF (GAUZE/BANDAGES/DRESSINGS) IMPLANT
BNDG CONFORM 2 STRL LF (GAUZE/BANDAGES/DRESSINGS) ×1 IMPLANT
BNDG ELASTIC 2 VLCR STRL LF (GAUZE/BANDAGES/DRESSINGS) IMPLANT
BNDG ESMARK 4X9 LF (GAUZE/BANDAGES/DRESSINGS) ×2 IMPLANT
BNDG GAUZE 1X2.1 STRL (MISCELLANEOUS) IMPLANT
BNDG GAUZE ELAST 4 BULKY (GAUZE/BANDAGES/DRESSINGS) IMPLANT
BNDG PLASTER X FAST 3X3 WHT LF (CAST SUPPLIES) IMPLANT
BNDG PLSTR 9X3 FST ST WHT (CAST SUPPLIES)
CHLORAPREP W/TINT 26ML (MISCELLANEOUS) ×3 IMPLANT
CLOSURE WOUND 1/2 X4 (GAUZE/BANDAGES/DRESSINGS)
CORDS BIPOLAR (ELECTRODE) ×3 IMPLANT
COVER BACK TABLE 60X90IN (DRAPES) ×3 IMPLANT
COVER MAYO STAND STRL (DRAPES) ×3 IMPLANT
CUFF TOURNIQUET SINGLE 18IN (TOURNIQUET CUFF) ×3 IMPLANT
DRAPE EXTREMITY T 121X128X90 (DRAPE) ×3 IMPLANT
DRAPE SURG 17X23 STRL (DRAPES) ×3 IMPLANT
GAUZE SPONGE 4X4 12PLY STRL (GAUZE/BANDAGES/DRESSINGS) ×3 IMPLANT
GAUZE XEROFORM 1X8 LF (GAUZE/BANDAGES/DRESSINGS) ×3 IMPLANT
GLOVE BIO SURGEON STRL SZ7.5 (GLOVE) ×3 IMPLANT
GLOVE BIOGEL PI IND STRL 6.5 (GLOVE) IMPLANT
GLOVE BIOGEL PI IND STRL 7.0 (GLOVE) IMPLANT
GLOVE BIOGEL PI IND STRL 8 (GLOVE) ×1 IMPLANT
GLOVE BIOGEL PI INDICATOR 6.5 (GLOVE) ×2
GLOVE BIOGEL PI INDICATOR 7.0 (GLOVE) ×2
GLOVE BIOGEL PI INDICATOR 8 (GLOVE) ×2
GLOVE ECLIPSE 6.5 STRL STRAW (GLOVE) ×2 IMPLANT
GLOVE EXAM NITRILE LRG STRL (GLOVE) ×2 IMPLANT
GLOVE SURG SS PI 6.5 STRL IVOR (GLOVE) ×2 IMPLANT
GOWN STRL REUS W/ TWL LRG LVL3 (GOWN DISPOSABLE) ×1 IMPLANT
GOWN STRL REUS W/TWL LRG LVL3 (GOWN DISPOSABLE) ×6
GOWN STRL REUS W/TWL XL LVL3 (GOWN DISPOSABLE) ×3 IMPLANT
NDL HYPO 25X1 1.5 SAFETY (NEEDLE) ×1 IMPLANT
NEEDLE HYPO 25X1 1.5 SAFETY (NEEDLE) ×3 IMPLANT
NS IRRIG 1000ML POUR BTL (IV SOLUTION) ×3 IMPLANT
PACK BASIN DAY SURGERY FS (CUSTOM PROCEDURE TRAY) ×3 IMPLANT
PAD CAST 3X4 CTTN HI CHSV (CAST SUPPLIES) IMPLANT
PAD CAST 4YDX4 CTTN HI CHSV (CAST SUPPLIES) IMPLANT
PADDING CAST ABS 4INX4YD NS (CAST SUPPLIES)
PADDING CAST ABS COTTON 4X4 ST (CAST SUPPLIES) ×1 IMPLANT
PADDING CAST COTTON 3X4 STRL (CAST SUPPLIES)
PADDING CAST COTTON 4X4 STRL (CAST SUPPLIES)
STOCKINETTE 4X48 STRL (DRAPES) ×3 IMPLANT
STRIP CLOSURE SKIN 1/2X4 (GAUZE/BANDAGES/DRESSINGS) IMPLANT
SUT ETHILON 3 0 PS 1 (SUTURE) IMPLANT
SUT ETHILON 4 0 PS 2 18 (SUTURE) ×3 IMPLANT
SYR BULB 3OZ (MISCELLANEOUS) ×3 IMPLANT
SYR CONTROL 10ML LL (SYRINGE) ×3 IMPLANT
TOWEL OR 17X24 6PK STRL BLUE (TOWEL DISPOSABLE) ×4 IMPLANT
UNDERPAD 30X30 INCONTINENT (UNDERPADS AND DIAPERS) ×1 IMPLANT

## 2014-07-05 NOTE — Brief Op Note (Signed)
07/05/2014  10:55 AM  PATIENT:  [name]  64 y.o. female  PRE-OPERATIVE DIAGNOSIS:  right small finger flexor TENOSYNOVISIS CYST AND STENOSING  POST-OPERATIVE DIAGNOSIS:  right small finger flexor TENOSYNOVISIS CYST AND STENOSING  PROCEDURE:  Procedure(s): EXCISION FLEXOR SHEATH CYST WITH RELEASE A-1 PULLEY RIGHT SMALL FINGER (Right) RELEASE RIGHT SMALL FINGER/A-1 PULLEY (Right)  SURGEON:  Surgeon(s) and Role:    * Leanora Cover, MD - Primary  PHYSICIAN ASSISTANT:   ASSISTANTS: none   ANESTHESIA:   general  EBL:     BLOOD ADMINISTERED:none  DRAINS: none   LOCAL MEDICATIONS USED:  MARCAINE     SPECIMEN:  Source of Specimen:  right hand  DISPOSITION OF SPECIMEN:  PATHOLOGY  COUNTS:  YES  TOURNIQUET:   Total Tourniquet Time Documented: Upper Arm (Right) - 13 minutes Total: Upper Arm (Right) - 13 minutes   DICTATION: .Other Dictation: Dictation Number [id number]  PLAN OF CARE: Discharge to home after PACU  PATIENT DISPOSITION:  PACU - hemodynamically stable.

## 2014-07-05 NOTE — H&P (Signed)
[name] is an 64 y.o. female.   Chief Complaint: right small finger trigger digit HPI: 64 yo rhd female with triggering of right small finger and associated cyst x 5 months.  This has been injected without resolution.  She wishes to have a trigger release and excision of the annular ligament cyst.  Past Medical History  Diagnosis Date  . Headache(784.0)   . Urinary incontinence   . Hypertension   . Neuropathy   . Wears dentures   . Wears contact lenses     Past Surgical History  Procedure Laterality Date  . Tuboplasty / tubotubal anastomosis    . Appendectomy    . Abdominal hysterectomy      oophorectomy  . Cervical fusion      and plating  . Colonoscopy  never    she declines to have one   . Dilation and curettage of uterus      Family History  Problem Relation Age of Onset  . Arthritis    . Breast cancer    . Hypertension     Social History:  reports that she has been smoking Cigarettes.  She has been smoking about 0.50 packs per [place]. She has never used smokeless tobacco. She reports that she does not drink alcohol or use illicit drugs.  Allergies:  Allergies  Allergen Reactions  . Lisinopril Anaphylaxis    Tongue swelling  . Penicillins Anaphylaxis and Hives    Medications Prior to Admission  Medication Sig Dispense Refill  . amLODipine (NORVASC) 5 MG tablet Take 1 tablet by mouth  daily 90 tablet 0  . furosemide (LASIX) 20 MG tablet Take 1 tablet (20 mg total) by mouth daily. 90 tablet 3  . gabapentin (NEURONTIN) 300 MG capsule Take 1 capsule (300 mg total) by mouth 3 (three) times daily. 270 capsule 3  . HYDROcodone-acetaminophen (NORCO) 10-325 MG per tablet TAKE 1 TABLET BY MOUTH EVERY 6 HOURS AS NEEDED FOR PAIN 120 tablet 0  . ibuprofen (ADVIL,MOTRIN) 200 MG tablet Take 200 mg by mouth every 6 (six) hours as needed.    . Multiple Vitamin (MULTIVITAMIN WITH MINERALS) TABS Take 1 tablet by mouth daily.    . vitamin B-12 (CYANOCOBALAMIN) 500 MCG tablet  Take 500 mcg by mouth 2 (two) times daily.       Results for orders placed or performed during the hospital encounter of 07/05/14 (from the past 48 hour(s))  Basic metabolic panel     Status: Abnormal   Collection Time: 07/03/14  9:00 AM  Result Value Ref Range   Sodium 137 135 - 145 mmol/L   Potassium 4.5 3.5 - 5.1 mmol/L   Chloride 106 96 - 112 mmol/L   CO2 25 19 - 32 mmol/L   Glucose, Bld 109 (H) 70 - 99 mg/dL   BUN 10 6 - 23 mg/dL   Creatinine, Ser 0.72 0.50 - 1.10 mg/dL   Calcium 8.9 8.4 - 10.5 mg/dL   GFR calc non Af Amer >90 >90 mL/min   GFR calc Af Amer >90 >90 mL/min    Comment: (NOTE) The eGFR has been calculated using the CKD EPI equation. This calculation has not been validated in all clinical situations. eGFR's persistently <90 mL/min signify possible Chronic Kidney Disease.    Anion gap 6 5 - 15    No results found.   A comprehensive review of systems was negative except for: Eyes: positive for contacts/glasses  Height 5' 4.5" (1.638 m), weight 86.183 kg (190  lb).  General appearance: alert, cooperative and appears stated age Head: Normocephalic, without obvious abnormality, atraumatic Neck: supple, symmetrical, trachea midline Resp: clear to auscultation bilaterally Cardio: regular rate and rhythm GI: non tender Extremities: intact sensation and capillary refill all digits.  +epl/fpl/io.  no wounds. Pulses: 2+ and symmetric Skin: Skin color, texture, turgor normal. No rashes or lesions Neurologic: Grossly normal Incision/Wound: none  Assessment/Plan Right small finger trigger digit and annular ligament cyst.  Non operative and operative treatment options were discussed with the patient and patient wishes to proceed with operative treatment. Risks, benefits, and alternatives of surgery were discussed and the patient agrees with the plan of care.   Thedford Bunton R 07/05/2014, 8:50 AM

## 2014-07-05 NOTE — Anesthesia Postprocedure Evaluation (Signed)
  Anesthesia Post-op Note  Patient: [name]  Procedure(s) Performed: Procedure(s) (LRB): EXCISION FLEXOR SHEATH CYST WITH RELEASE A-1 PULLEY RIGHT SMALL FINGER (Right) RELEASE RIGHT SMALL FINGER/A-1 PULLEY (Right)  Patient Location: PACU  Anesthesia Type: General  Level of Consciousness: awake and alert   Airway and Oxygen Therapy: Patient Spontanous Breathing  Post-op Pain: mild  Post-op Assessment: Post-op Vital signs reviewed, Patient's Cardiovascular Status Stable, Respiratory Function Stable, Patent Airway and No signs of Nausea or vomiting  Last Vitals:  Filed Vitals:   07/05/14 1115  BP: 105/53  Pulse: 88  Temp:   Resp: 29    Post-op Vital Signs: stable   Complications: No apparent anesthesia complications

## 2014-07-05 NOTE — Anesthesia Procedure Notes (Signed)
Procedure Name: LMA Insertion Performed by: Terrance Mass Pre-anesthesia Checklist: Patient identified, Emergency Drugs available, Suction available, Timeout performed and Patient being monitored Patient Re-evaluated:Patient Re-evaluated prior to inductionOxygen Delivery Method: Circle system utilized Preoxygenation: Pre-oxygenation with 100% oxygen Intubation Type: IV induction Ventilation: Mask ventilation without difficulty LMA: LMA inserted LMA Size: 5.0 Tube type: Oral Number of attempts: 1 Placement Confirmation: positive ETCO2 Tube secured with: Tape Dental Injury: Teeth and Oropharynx as per pre-operative assessment

## 2014-07-05 NOTE — Anesthesia Preprocedure Evaluation (Signed)
Anesthesia Evaluation  Patient identified by MRN, date of birth, ID band Patient awake    Reviewed: Allergy & Precautions, NPO status , Patient's Chart, lab work & pertinent test results  Airway Mallampati: II  TM Distance: >3 FB Neck ROM: Full    Dental no notable dental hx.    Pulmonary Current Smoker,  breath sounds clear to auscultation  Pulmonary exam normal       Cardiovascular hypertension, Pt. on medications Rhythm:Regular Rate:Normal     Neuro/Psych negative neurological ROS  negative psych ROS   GI/Hepatic negative GI ROS, Neg liver ROS,   Endo/Other  negative endocrine ROS  Renal/GU negative Renal ROS  negative genitourinary   Musculoskeletal negative musculoskeletal ROS (+)   Abdominal   Peds negative pediatric ROS (+)  Hematology negative hematology ROS (+)   Anesthesia Other Findings   Reproductive/Obstetrics negative OB ROS                             Anesthesia Physical Anesthesia Plan  ASA: II  Anesthesia Plan: General   Post-op Pain Management:    Induction: Intravenous  Airway Management Planned: LMA  Additional Equipment:   Intra-op Plan:   Post-operative Plan: Extubation in OR  Informed Consent: I have reviewed the patients History and Physical, chart, labs and discussed the procedure including the risks, benefits and alternatives for the proposed anesthesia with the patient or authorized representative who has indicated his/her understanding and acceptance.   Dental advisory given  Plan Discussed with: CRNA and Surgeon  Anesthesia Plan Comments:         Anesthesia Quick Evaluation

## 2014-07-05 NOTE — Op Note (Addendum)
[id number]  

## 2014-07-05 NOTE — Transfer of Care (Signed)
Immediate Anesthesia Transfer of Care Note  Patient: [name]  Procedure(s) Performed: Procedure(s): EXCISION FLEXOR SHEATH CYST WITH RELEASE A-1 PULLEY RIGHT SMALL FINGER (Right) RELEASE RIGHT SMALL FINGER/A-1 PULLEY (Right)  Patient Location: PACU  Anesthesia Type:General  Level of Consciousness: awake and alert   Airway & Oxygen Therapy: Patient Spontanous Breathing and Patient connected to face mask oxygen  Post-op Assessment: Report given to RN and Post -op Vital signs reviewed and stable  Post vital signs: Reviewed and stable  Last Vitals:  Filed Vitals:   07/05/14 1100  BP:   Pulse: 90  Temp:   Resp:     Complications: No apparent anesthesia complications

## 2014-07-05 NOTE — Discharge Instructions (Addendum)
[facility] Instructions °Hand Surgery ° °Wound Care: °Keep your hand elevated above the level of your heart.  Do not allow it to dangle by your side.  Keep the dressing dry and do not remove it unless your doctor advises you to do so.  He will usually change it at the time of your post-op visit.  Moving your fingers is advised to stimulate circulation but will depend on the site of your surgery.  If you have a splint applied, your doctor will advise you regarding movement. ° °Activity: °Do not drive or operate machinery today.  Rest today and then you may return to your normal activity and work as indicated by your physician. ° °Diet:  °Drink liquids today or eat a light diet.  You may resume a regular diet tomorrow.   ° °General expectations: °Pain for two to three days. °Fingers may become slightly swollen. ° °Call your doctor if any of the following occur: °Severe pain not relieved by pain medication. °Elevated temperature. °Dressing soaked with blood. °Inability to move fingers. °White or bluish color to fingers. ° ° °Post Anesthesia Home Care Instructions ° °Activity: °Get plenty of rest for the remainder of the [place]. A responsible adult should stay with you for 24 hours following the procedure.  °For the next 24 hours, DO NOT: °-Drive a car °-Operate machinery °-Drink alcoholic beverages °-Take any medication unless instructed by your physician °-Make any legal decisions or sign important papers. ° °Meals: °Start with liquid foods such as gelatin or soup. Progress to regular foods as tolerated. Avoid greasy, spicy, heavy foods. If nausea and/or vomiting occur, drink only clear liquids until the nausea and/or vomiting subsides. Call your physician if vomiting continues. ° °Special Instructions/Symptoms: °Your throat may feel dry or sore from the anesthesia or the breathing tube placed in your throat during surgery. If this causes discomfort, gargle with warm salt water. The discomfort should disappear within 24  hours. ° °

## 2014-07-06 NOTE — Op Note (Signed)
[name], [name] NO.:  [id number]  MEDICAL RECORD NO.:  [id number]  LOCATION:                                 FACILITY:  PHYSICIAN:  Leanora Cover, MD        DATE OF BIRTH:  01/12/1951  DATE OF PROCEDURE:  07/05/2014 DATE OF DISCHARGE:                              OPERATIVE REPORT   PREOPERATIVE DIAGNOSIS:  Right small finger trigger digit with associated annular ligament cyst.  POSTOPERATIVE DIAGNOSIS:  Right small finger trigger digit with associated annular ligament cyst.  PROCEDURE:  Right small finger release A1 pulley and excision of annular ligament cyst.  SURGEON:  Leanora Cover, MD  ASSISTANT:  None.  ANESTHESIA:  General.  IV FLUIDS:  Per anesthesia flow sheet.  ESTIMATED BLOOD LOSS:  Minimal.  COMPLICATIONS:  None.  SPECIMENS:  Right small finger flexor sheath cyst to Pathology.  TOURNIQUET TIME:  13 minutes.  DISPOSITION:  Stable to PACU.  INDICATIONS:  [name] is a 64 year old female who has had triggering of the right small finger and associated cyst with it.  These are bothersome to her.  She wishes to have a release of the trigger digit and removal of the cyst.  Risks, benefits, and alternatives of surgery were discussed including risk of blood loss, infection, damage to nerves, vessels, tendons, ligaments, bone; failure of surgery; need for additional surgery, complications with wound healing, continued pain, recurrence of the cyst, and recurrence of triggering.  She voiced understanding of these risks and elected to proceed.  OPERATIVE COURSE:  After being identified preoperatively by myself, the patient and I agreed upon procedure and site of procedure.  Surgical site was marked.  The risks, benefits, and alternatives of surgery were reviewed and she wished to proceed.  Surgical consent had been signed. She was given IV vancomycin as preoperative antibiotic prophylaxis due to PENICILLIN allergy.  She was transported to the  operating room and placed on the operating room table in a supine position with the right upper extremity on arm board.  General anesthesia was induced by anesthesiologist.  Right upper extremity was prepped and draped in normal sterile orthopedic fashion.  Surgical pause was performed between surgeons, anesthesia, and operating room staff, and all were in agreement as to the patient, procedure, and site of procedure. Tourniquet at the proximal aspect of the extremity was inflated to 250 mmHg after exsanguination of the limb with an Esmarch bandage.  Incision was made at the volar aspect of the MP joint of the small finger and carried into subcutaneous tissues by spreading technique.  The annular ligament cyst was easily identified.  It was cleared of any soft tissue attachments.  Care was taken to protect the radial and ulnar neurovascular bundles.  The A1 pulley was sharply incised.  The section of pulley where the cyst was located was excised and sent to Pathology for examination.  The tendons were brought through the wound and separated.  The FDS tendon was very small.  The finger was placed through range of motion.  There was no recurrence of triggering.  The wound was copiously irrigated with sterile saline and closed with  4-0 nylon in a horizontal mattress fashion.  It was injected with 5 mL of 0.25% plain Marcaine to aid in postoperative analgesia.  It was then dressed with sterile Xeroform, 4x4s, and wrapped with a Coban dressing lightly.  Tourniquet was deflated at 13 minutes.  Fingertips were pink with brisk capillary refill after deflation of the tourniquet. Operative drapes were broken down.  The patient was awoken from anesthesia safely.  She was transferred back to the stretcher and taken to PACU in stable condition.  I will see her back in the office in 1 week for postoperative followup.  I will give her Norco 5/325, 1-2 p.o. q.6 hours p.r.n. pain, dispensed  #30.     Leanora Cover, MD     KK/MEDQ  D:  07/05/2014  T:  07/06/2014  Job:  [id number]

## 2014-07-09 ENCOUNTER — Encounter (HOSPITAL_BASED_OUTPATIENT_CLINIC_OR_DEPARTMENT_OTHER): Payer: Self-pay | Admitting: Orthopedic Surgery

## 2014-07-09 LAB — POCT HEMOGLOBIN-HEMACUE: Hemoglobin: 13.3 g/dL (ref 12.0–15.0)

## 2014-07-16 ENCOUNTER — Encounter: Payer: Self-pay | Admitting: Family Medicine

## 2014-07-17 ENCOUNTER — Other Ambulatory Visit: Payer: 59

## 2014-07-24 ENCOUNTER — Ambulatory Visit (INDEPENDENT_AMBULATORY_CARE_PROVIDER_SITE_OTHER): Payer: 59 | Admitting: Family Medicine

## 2014-07-24 ENCOUNTER — Encounter: Payer: Self-pay | Admitting: Family Medicine

## 2014-07-24 VITALS — BP 140/71 | HR 85 | Temp 99.1°F | Ht 64.0 in | Wt 195.0 lb

## 2014-07-24 DIAGNOSIS — Z23 Encounter for immunization: Secondary | ICD-10-CM

## 2014-07-24 DIAGNOSIS — Z0000 Encounter for general adult medical examination without abnormal findings: Secondary | ICD-10-CM

## 2014-07-24 LAB — CBC WITH DIFFERENTIAL/PLATELET
BASOS PCT: 0.4 % (ref 0.0–3.0)
Basophils Absolute: 0.1 10*3/uL (ref 0.0–0.1)
EOS ABS: 0.2 10*3/uL (ref 0.0–0.7)
EOS PCT: 1.4 % (ref 0.0–5.0)
HEMATOCRIT: 41.2 % (ref 36.0–46.0)
Hemoglobin: 13.6 g/dL (ref 12.0–15.0)
LYMPHS ABS: 4.6 10*3/uL — AB (ref 0.7–4.0)
Lymphocytes Relative: 33.1 % (ref 12.0–46.0)
MCHC: 33 g/dL (ref 30.0–36.0)
MCV: 79.3 fl (ref 78.0–100.0)
MONO ABS: 0.8 10*3/uL (ref 0.1–1.0)
Monocytes Relative: 5.9 % (ref 3.0–12.0)
NEUTROS ABS: 8.3 10*3/uL — AB (ref 1.4–7.7)
Neutrophils Relative %: 59.2 % (ref 43.0–77.0)
Platelets: 331 10*3/uL (ref 150.0–400.0)
RBC: 5.2 Mil/uL — AB (ref 3.87–5.11)
RDW: 15.5 % (ref 11.5–15.5)
WBC: 14 10*3/uL — AB (ref 4.0–10.5)

## 2014-07-24 LAB — HEPATIC FUNCTION PANEL
ALBUMIN: 4.2 g/dL (ref 3.5–5.2)
ALK PHOS: 126 U/L — AB (ref 39–117)
ALT: 11 U/L (ref 0–35)
AST: 13 U/L (ref 0–37)
BILIRUBIN DIRECT: 0 mg/dL (ref 0.0–0.3)
TOTAL PROTEIN: 7.8 g/dL (ref 6.0–8.3)
Total Bilirubin: 0.3 mg/dL (ref 0.2–1.2)

## 2014-07-24 LAB — TSH: TSH: 2.82 u[IU]/mL (ref 0.35–4.50)

## 2014-07-24 NOTE — Addendum Note (Signed)
Addended by: Aggie Hacker A on: 07/24/2014 11:51 AM   Modules accepted: Orders

## 2014-07-24 NOTE — Progress Notes (Signed)
   Subjective:    Patient ID: [name], female    DOB: Sep 24, 1950, 64 y.o.   MRN: [id number]  HPI 64 yr old female for a cpx. She feels fine. She knows she is overweight and she plans to join a group of coworkers that is trying to lose weight.    Review of Systems  Constitutional: Negative.   HENT: Negative.   Eyes: Negative.   Respiratory: Negative.   Cardiovascular: Negative.   Gastrointestinal: Negative.   Genitourinary: Negative for dysuria, urgency, frequency, hematuria, flank pain, decreased urine volume, enuresis, difficulty urinating, pelvic pain and dyspareunia.  Musculoskeletal: Negative.   Skin: Negative.   Neurological: Negative.   Psychiatric/Behavioral: Negative.        Objective:   Physical Exam  Constitutional: She is oriented to person, place, and time. She appears well-developed and well-nourished. No distress.  HENT:  Head: Normocephalic and atraumatic.  Right Ear: External ear normal.  Left Ear: External ear normal.  Nose: Nose normal.  Mouth/Throat: Oropharynx is clear and moist. No oropharyngeal exudate.  Eyes: Conjunctivae and EOM are normal. Pupils are equal, round, and reactive to light. No scleral icterus.  Neck: Normal range of motion. Neck supple. No JVD present. No thyromegaly present.  Cardiovascular: Normal rate, regular rhythm, normal heart sounds and intact distal pulses.  Exam reveals no gallop and no friction rub.   No murmur heard. Pulmonary/Chest: Effort normal and breath sounds normal. No respiratory distress. She has no wheezes. She has no rales. She exhibits no tenderness.  Abdominal: Soft. Bowel sounds are normal. She exhibits no distension and no mass. There is no tenderness. There is no rebound and no guarding.  Musculoskeletal: Normal range of motion. She exhibits no edema or tenderness.  Lymphadenopathy:    She has no cervical adenopathy.  Neurological: She is alert and oriented to person, place, and time. She has normal  reflexes. No cranial nerve deficit. She exhibits normal muscle tone. Coordination normal.  Skin: Skin is warm and dry. No rash noted. No erythema.  Psychiatric: She has a normal mood and affect. Her behavior is normal. Judgment and thought content normal.          Assessment & Plan:  Well exam. Get labs today. Set up a colonoscopy

## 2014-07-24 NOTE — Progress Notes (Signed)
Pre visit review using our clinic review tool, if applicable. No additional management support is needed unless otherwise documented below in the visit note. 

## 2014-07-25 ENCOUNTER — Encounter: Payer: Self-pay | Admitting: Internal Medicine

## 2014-08-13 ENCOUNTER — Encounter: Payer: Self-pay | Admitting: Family Medicine

## 2014-08-13 NOTE — Telephone Encounter (Signed)
This is a duplicate note, see previous one.  

## 2014-08-14 ENCOUNTER — Telehealth: Payer: Self-pay | Admitting: Family Medicine

## 2014-08-14 MED ORDER — HYDROCODONE-ACETAMINOPHEN 10-325 MG PO TABS: ORAL_TABLET | ORAL | Status: DC

## 2014-08-14 NOTE — Telephone Encounter (Signed)
done

## 2014-08-14 NOTE — Telephone Encounter (Signed)
Pt needs new rx hydrocodone for next 3 month and would like to come in tomorrow around 9am shingle vaccine

## 2014-08-15 ENCOUNTER — Ambulatory Visit (INDEPENDENT_AMBULATORY_CARE_PROVIDER_SITE_OTHER): Payer: 59 | Admitting: Family Medicine

## 2014-08-15 DIAGNOSIS — Z23 Encounter for immunization: Secondary | ICD-10-CM | POA: Diagnosis not present

## 2014-08-15 NOTE — Telephone Encounter (Signed)
Script is ready for pick up and pt is on injection schedule.

## 2014-09-06 ENCOUNTER — Encounter: Payer: Self-pay | Admitting: Family Medicine

## 2014-09-06 ENCOUNTER — Ambulatory Visit (AMBULATORY_SURGERY_CENTER): Payer: Self-pay | Admitting: *Deleted

## 2014-09-06 VITALS — Ht 64.0 in | Wt 192.0 lb

## 2014-09-06 DIAGNOSIS — Z1211 Encounter for screening for malignant neoplasm of colon: Secondary | ICD-10-CM

## 2014-09-06 MED ORDER — NA SULFATE-K SULFATE-MG SULF 17.5-3.13-1.6 GM/177ML PO SOLN: 1.0000 | Freq: Once | ORAL | Status: DC

## 2014-09-06 NOTE — Progress Notes (Signed)
No egg or soy allergy. No anesthesia problems.  No home O2.  No diet meds.  

## 2014-09-07 MED ORDER — GABAPENTIN 300 MG PO CAPS: 300.0000 mg | ORAL_CAPSULE | Freq: Three times a day (TID) | ORAL | Status: DC

## 2014-09-12 ENCOUNTER — Encounter: Payer: Self-pay | Admitting: Internal Medicine

## 2014-09-20 ENCOUNTER — Other Ambulatory Visit: Payer: Self-pay | Admitting: Internal Medicine

## 2014-09-20 ENCOUNTER — Encounter: Payer: Self-pay | Admitting: Internal Medicine

## 2014-09-20 ENCOUNTER — Ambulatory Visit (AMBULATORY_SURGERY_CENTER): Payer: 59 | Admitting: Internal Medicine

## 2014-09-20 VITALS — BP 117/65 | HR 65 | Temp 96.8°F | Resp 16 | Ht 64.0 in | Wt 192.0 lb

## 2014-09-20 DIAGNOSIS — D120 Benign neoplasm of cecum: Secondary | ICD-10-CM

## 2014-09-20 DIAGNOSIS — K635 Polyp of colon: Secondary | ICD-10-CM | POA: Diagnosis not present

## 2014-09-20 DIAGNOSIS — D127 Benign neoplasm of rectosigmoid junction: Secondary | ICD-10-CM

## 2014-09-20 DIAGNOSIS — Z1211 Encounter for screening for malignant neoplasm of colon: Secondary | ICD-10-CM | POA: Diagnosis present

## 2014-09-20 HISTORY — PX: COLONOSCOPY: SHX174

## 2014-09-20 MED ORDER — SODIUM CHLORIDE 0.9 % IV SOLN: 500.0000 mL | INTRAVENOUS | Status: DC

## 2014-09-20 NOTE — Progress Notes (Signed)
Report to PACU, RN, vss, BBS= Clear.  

## 2014-09-20 NOTE — Op Note (Signed)
[place]  [address] [place], [id number]   COLONOSCOPY PROCEDURE REPORT  PATIENT: Regina, [name]  MR#: [id number] BIRTHDATE: [birth year] , 64  yrs. old GENDER: female ENDOSCOPIST: Jerene Bears, MD REFERRED WF:[id number] Raymon Mutton, M.D. PROCEDURE DATE:  09/20/2014 PROCEDURE:   Colonoscopy with snare polypectomy, Colonoscopy with cold biopsy polypectomy, and Colonoscopy, screening First Screening Colonoscopy - Avg.  risk and is 50 yrs.  old or older Yes.  Prior Negative Screening - Now for repeat screening. N/A  History of Adenoma - Now for follow-up colonoscopy & has been > or = to 3 yrs.  N/A  Polyps removed today = YES ASA CLASS:   Class II INDICATIONS:Screening for colonic neoplasia and Colorectal Neoplasm Risk Assessment for this procedure is average risk. MEDICATIONS: Monitored anesthesia care and Propofol 300 mg IV  DESCRIPTION OF PROCEDURE:   After the risks benefits and alternatives of the procedure were thoroughly explained, informed consent was obtained.  The digital rectal exam revealed no rectal mass.   The LB PCF Q180 [id number]  endoscope was introduced through the anus and advanced to the cecum, which was identified by both the appendix and ileocecal valve. No adverse events experienced. The quality of the prep was good.  (Suprep was used)  The instrument was then slowly withdrawn as the colon was fully examined.    COLON FINDINGS: A sessile polyp measuring 3 mm in size was found at the cecum.  A polypectomy was performed with cold forceps.  The resection was complete, the polyp tissue was completely retrieved and sent to histology.   A sessile polyp measuring 5 mm in size was found in the ascending colon.  A polypectomy was performed with a cold snare.  The resection was complete but the polyp tissue was not retrieved.   Four sessile polyps ranging between 3-65mm in size were found in the sigmoid colon and rectum.  Polypectomies were performed with a  cold snare.  The resection was complete, the polyp tissue was completely retrieved and sent to histology.   There was moderate diverticulosis noted in the descending colon and sigmoid colon.  Retroflexed views revealed no abnormalities. The time to cecum = 4.6 Withdrawal time = 13.5   The scope was withdrawn and the procedure completed.  COMPLICATIONS: There were no immediate complications.  ENDOSCOPIC IMPRESSION: 1.   Sessile polyp was found at the cecum; polypectomy was performed with cold forceps 2.   Sessile polyp was found in the ascending colon; polypectomy was performed with a cold snare 3.   Four sessile polyps ranging between 3-42mm in size were found in the sigmoid colon and rectum; polypectomies were performed with a cold snare 4.   Moderate diverticulosis was noted in the descending colon and sigmoid colon  RECOMMENDATIONS: 1.  Await pathology results 2.  High fiber diet 3.  Timing of repeat colonoscopy will be determined by pathology findings. 4.  You will receive a letter within 1-2 weeks with the results of your biopsy as well as final recommendations.  Please call my office if you have not received a letter after 3 weeks.  eSigned:  Jerene Bears, MD 2014-09-20 73:[id number]   cc: The Patient and Laurey Morale, MD   PATIENT NAME:  Regina, [name] MR#: [id number]

## 2014-09-20 NOTE — Progress Notes (Signed)
Called to room to assist during endoscopic procedure.  Patient ID and intended procedure confirmed with present staff. Received instructions for my participation in the procedure from the performing physician.  

## 2014-09-20 NOTE — Progress Notes (Signed)
Pt asked Randye Lobo, CRNA to take her glass to her husband, Hendricks Milo in the waiting room prior to going back to the procedure room. maw

## 2014-09-20 NOTE — Patient Instructions (Signed)
YOU HAD AN ENDOSCOPIC PROCEDURE TODAY AT [facility]:   Refer to the procedure report that was given to you for any specific questions about what was found during the examination.  If the procedure report does not answer your questions, please call your gastroenterologist to clarify.  If you requested that your care partner not be given the details of your procedure findings, then the procedure report has been included in a sealed envelope for you to review at your convenience later.  YOU SHOULD EXPECT: Some feelings of bloating in the abdomen. Passage of more gas than usual.  Walking can help get rid of the air that was put into your GI tract during the procedure and reduce the bloating. If you had a lower endoscopy (such as a colonoscopy or flexible sigmoidoscopy) you may notice spotting of blood in your stool or on the toilet paper. If you underwent a bowel prep for your procedure, you may not have a normal bowel movement for a few days.  Please Note:  You might notice some irritation and congestion in your nose or some drainage.  This is from the oxygen used during your procedure.  There is no need for concern and it should clear up in a [place] or so.  SYMPTOMS TO REPORT IMMEDIATELY:   Following lower endoscopy (colonoscopy or flexible sigmoidoscopy):  Excessive amounts of blood in the stool  Significant tenderness or worsening of abdominal pains  Swelling of the abdomen that is new, acute  Fever of 100F or higher   For urgent or emergent issues, a gastroenterologist can be reached at any hour by calling [phone].   DIET: Your first meal following the procedure should be a small meal and then it is ok to progress to your normal diet. Heavy or fried foods are harder to digest and may make you feel nauseous or bloated.  Likewise, meals heavy in dairy and vegetables can increase bloating.  Drink plenty of fluids but you should avoid alcoholic beverages for 24  hours.  ACTIVITY:  You should plan to take it easy for the rest of today and you should NOT DRIVE or use heavy machinery until tomorrow (because of the sedation medicines used during the test).    FOLLOW UP: Our staff will call the number listed on your records the next business [place] following your procedure to check on you and address any questions or concerns that you may have regarding the information given to you following your procedure. If we do not reach you, we will leave a message.  However, if you are feeling well and you are not experiencing any problems, there is no need to return our call.  We will assume that you have returned to your regular daily activities without incident.  If any biopsies were taken you will be contacted by phone or by letter within the next 1-3 weeks.  Please call us at [phone] if you have not heard about the biopsies in 3 weeks.    SIGNATURES/CONFIDENTIALITY: You and/or your care partner have signed paperwork which will be entered into your electronic medical record.  These signatures attest to the fact that that the information above on your After Visit Summary has been reviewed and is understood.  Full responsibility of the confidentiality of this discharge information lies with you and/or your care-partner.  Polyps, diverticulosis, high fiber diet-handouts given  Repeat colonoscopy will be determined by pathology.

## 2014-09-21 ENCOUNTER — Telehealth: Payer: Self-pay | Admitting: *Deleted

## 2014-09-21 NOTE — Telephone Encounter (Signed)
  Follow up Call-  Call back number 09/20/2014  Post procedure Call Back phone  # [phone]  Permission to leave phone message Yes     Patient questions:  Do you have a fever, pain , or abdominal swelling? No. Pain Score  0 *  Have you tolerated food without any problems? Yes.    Have you been able to return to your normal activities? Yes.    Do you have any questions about your discharge instructions: Diet   No. Medications  No. Follow up visit  No.  Do you have questions or concerns about your Care? No.  Actions: * If pain score is 4 or above: No action needed, pain <4.

## 2014-09-22 ENCOUNTER — Other Ambulatory Visit: Payer: Self-pay | Admitting: Family Medicine

## 2014-09-26 ENCOUNTER — Encounter: Payer: Self-pay | Admitting: Internal Medicine

## 2014-10-18 ENCOUNTER — Telehealth: Payer: Self-pay

## 2014-10-18 NOTE — Telephone Encounter (Signed)
Left message for pt to return call (concerning overdue mammagram)

## 2014-10-25 ENCOUNTER — Ambulatory Visit (INDEPENDENT_AMBULATORY_CARE_PROVIDER_SITE_OTHER): Payer: 59 | Admitting: Family Medicine

## 2014-10-25 ENCOUNTER — Encounter: Payer: Self-pay | Admitting: Family Medicine

## 2014-10-25 VITALS — BP 131/73 | HR 87 | Temp 98.1°F | Ht 64.0 in | Wt 196.0 lb

## 2014-10-25 DIAGNOSIS — H6123 Impacted cerumen, bilateral: Secondary | ICD-10-CM

## 2014-10-25 DIAGNOSIS — R609 Edema, unspecified: Secondary | ICD-10-CM

## 2014-10-25 DIAGNOSIS — I10 Essential (primary) hypertension: Secondary | ICD-10-CM

## 2014-10-25 MED ORDER — METOPROLOL SUCCINATE ER 50 MG PO TB24: 50.0000 mg | ORAL_TABLET | Freq: Every day | ORAL | Status: DC

## 2014-10-25 NOTE — Progress Notes (Signed)
Pre visit review using our clinic review tool, if applicable. No additional management support is needed unless otherwise documented below in the visit note. 

## 2014-10-25 NOTE — Progress Notes (Signed)
   Subjective:    Patient ID: [name], female    DOB: Mar 05, 1951, 64 y.o.   MRN: [id number]  HPI Here for 2 things. First she thinks her ears are full of wax. She has decreased hearing on both sides. There is no pain. Also she has had swelling in both feet and ankles for a few months. No pain. Her BP has been stable.   Review of Systems  Constitutional: Negative.   HENT: Positive for hearing loss. Negative for ear discharge and ear pain.   Respiratory: Negative.   Cardiovascular: Positive for leg swelling. Negative for chest pain and palpitations.  Neurological: Negative.        Objective:   Physical Exam  Constitutional: She appears well-developed and well-nourished.  HENT:  Bot ear canals are full of cerumen   Cardiovascular: Normal rate, regular rhythm, normal heart sounds and intact distal pulses.   Pulmonary/Chest: Effort normal and breath sounds normal. She has no rales.  Musculoskeletal:  2+ edema in the left foot and 1+ in the right           Assessment & Plan:  The ears were irrigated clear with water. Her HTN is stable but she is having edema as a side effect of the Amlodipine. This will be stopped and she is given Metoprolol succinate in its place. Recheck one month

## 2014-11-09 ENCOUNTER — Encounter: Payer: Self-pay | Admitting: Family Medicine

## 2014-11-09 MED ORDER — HYDROCODONE-ACETAMINOPHEN 10-325 MG PO TABS: ORAL_TABLET | ORAL | Status: DC

## 2014-11-09 NOTE — Telephone Encounter (Signed)
done

## 2015-01-17 ENCOUNTER — Other Ambulatory Visit: Payer: Self-pay | Admitting: Family Medicine

## 2015-02-07 ENCOUNTER — Encounter: Payer: Self-pay | Admitting: Family Medicine

## 2015-02-08 ENCOUNTER — Telehealth: Payer: Self-pay | Admitting: Family Medicine

## 2015-02-08 MED ORDER — HYDROCODONE-ACETAMINOPHEN 10-325 MG PO TABS: ORAL_TABLET | ORAL | Status: DC

## 2015-02-08 NOTE — Telephone Encounter (Signed)
Script is ready for pick up and I left a voice message for pt. 

## 2015-02-08 NOTE — Telephone Encounter (Signed)
done

## 2015-02-08 NOTE — Telephone Encounter (Signed)
Pt needs new rx hydrocodone for next 3 months. Pt would like to pick up rx today between 12 and 1 pm. Pt does not get off work until 6pm. Pt has sent email

## 2015-02-11 NOTE — Telephone Encounter (Signed)
Scripts are ready for pick up. 

## 2015-03-06 ENCOUNTER — Ambulatory Visit (INDEPENDENT_AMBULATORY_CARE_PROVIDER_SITE_OTHER): Payer: 59 | Admitting: Family Medicine

## 2015-03-06 ENCOUNTER — Encounter: Payer: Self-pay | Admitting: Family Medicine

## 2015-03-06 VITALS — BP 147/82 | HR 77 | Temp 98.4°F | Ht 64.0 in | Wt 192.0 lb

## 2015-03-06 DIAGNOSIS — H6123 Impacted cerumen, bilateral: Secondary | ICD-10-CM | POA: Diagnosis not present

## 2015-03-06 NOTE — Progress Notes (Signed)
   Subjective:    Patient ID: [name], female    DOB: 12-Feb-1951, 64 y.o.   MRN: [id number]  HPI Here to clean out wax from the ears. For the past week she has had decreased hearing and a pressure sensation in the ears. No ear pain. She typically has her ears cleaned out 2-3 times a year.    Review of Systems  Constitutional: Negative.   HENT: Positive for congestion, hearing loss and sinus pressure. Negative for ear discharge, ear pain, postnasal drip and sore throat.   Eyes: Negative.   Respiratory: Negative.        Objective:   Physical Exam  Constitutional: She appears well-developed and well-nourished. No distress.  HENT:  Nose: Nose normal.  Mouth/Throat: Oropharynx is clear and moist.  Bot ear canals are blocked with cerumen   Eyes: Conjunctivae are normal.  Neck: No thyromegaly present.  Pulmonary/Chest: Effort normal and breath sounds normal.  Lymphadenopathy:    She has no cervical adenopathy.          Assessment & Plan:  Cerumen impactions. Both ears were irrigated clear with water. For the stuffy sinuses I suggested she try Flonase sprays daily

## 2015-03-06 NOTE — Progress Notes (Signed)
Pre visit review using our clinic review tool, if applicable. No additional management support is needed unless otherwise documented below in the visit note. 

## 2015-03-20 ENCOUNTER — Other Ambulatory Visit: Payer: Self-pay

## 2015-03-20 MED ORDER — METOPROLOL SUCCINATE ER 50 MG PO TB24: ORAL_TABLET | ORAL | Status: DC

## 2015-04-08 ENCOUNTER — Encounter: Payer: Self-pay | Admitting: Family Medicine

## 2015-04-09 MED ORDER — METOPROLOL SUCCINATE ER 50 MG PO TB24: 50.0000 mg | ORAL_TABLET | Freq: Every day | ORAL | Status: DC

## 2015-04-09 NOTE — Telephone Encounter (Signed)
Ready to fax  

## 2015-05-04 ENCOUNTER — Encounter: Payer: Self-pay | Admitting: Family Medicine

## 2015-05-09 ENCOUNTER — Telehealth: Payer: Self-pay | Admitting: Family Medicine

## 2015-05-09 ENCOUNTER — Ambulatory Visit: Payer: Self-pay | Admitting: Family Medicine

## 2015-05-09 MED ORDER — HYDROCODONE-ACETAMINOPHEN 10-325 MG PO TABS: ORAL_TABLET | ORAL | Status: DC

## 2015-05-09 NOTE — Telephone Encounter (Signed)
done

## 2015-05-09 NOTE — Telephone Encounter (Signed)
Pt request refill of the following: HYDROcodone-acetaminophen (NORCO) 10-325 MG tablet  Pt said she sent email stating she wanted to pick up the rx today at 1pm   Please call when ready to pick up [phone]   Phamacy:

## 2015-05-09 NOTE — Telephone Encounter (Signed)
This is a duplicate, see previous note.  

## 2015-08-02 ENCOUNTER — Ambulatory Visit: Payer: Self-pay | Admitting: Family Medicine

## 2015-08-02 ENCOUNTER — Ambulatory Visit (INDEPENDENT_AMBULATORY_CARE_PROVIDER_SITE_OTHER): Payer: 59 | Admitting: Family Medicine

## 2015-08-02 ENCOUNTER — Encounter: Payer: Self-pay | Admitting: Family Medicine

## 2015-08-02 VITALS — BP 124/78 | HR 87 | Temp 98.6°F | Ht 64.0 in | Wt 177.0 lb

## 2015-08-02 DIAGNOSIS — J019 Acute sinusitis, unspecified: Secondary | ICD-10-CM | POA: Diagnosis not present

## 2015-08-02 MED ORDER — AZITHROMYCIN 250 MG PO TABS
ORAL_TABLET | ORAL | Status: DC
Start: 2015-08-02 — End: 2016-04-29

## 2015-08-02 MED ORDER — HYDROCODONE-ACETAMINOPHEN 10-325 MG PO TABS: ORAL_TABLET | ORAL | Status: DC

## 2015-08-02 MED ORDER — HYDROCODONE-ACETAMINOPHEN 10-325 MG PO TABS
ORAL_TABLET | ORAL | Status: DC
Start: 2015-08-02 — End: 2015-08-02

## 2015-08-02 MED ORDER — HYDROCODONE-HOMATROPINE 5-1.5 MG/5ML PO SYRP: 5.0000 mL | ORAL_SOLUTION | ORAL | Status: DC | PRN

## 2015-08-02 NOTE — Progress Notes (Signed)
   Subjective:    Patient ID: [name], female    DOB: 1951/05/05, 65 y.o.   MRN: [id number]  HPI Here for 5 days of sinus pressure, PND, and coughing up yellow sputum. Using Tylenol Cold and Sinus.    Review of Systems  Constitutional: Negative.   HENT: Positive for congestion, postnasal drip and sinus pressure. Negative for ear pain and sore throat.   Eyes: Negative.   Respiratory: Positive for cough.        Objective:   Physical Exam  Constitutional: She appears well-developed and well-nourished.  HENT:  Right Ear: External ear normal.  Left Ear: External ear normal.  Nose: Nose normal.  Mouth/Throat: Oropharynx is clear and moist.  Eyes: Conjunctivae are normal.  Neck: No thyromegaly present.  Pulmonary/Chest: Effort normal and breath sounds normal.  Lymphadenopathy:    She has no cervical adenopathy.          Assessment & Plan:  Sinusitis, treat with a Zpack

## 2015-08-02 NOTE — Progress Notes (Signed)
Pre visit review using our clinic review tool, if applicable. No additional management support is needed unless otherwise documented below in the visit note. 

## 2015-08-05 ENCOUNTER — Ambulatory Visit: Payer: Self-pay | Admitting: Family Medicine

## 2015-08-22 ENCOUNTER — Other Ambulatory Visit: Payer: Self-pay | Admitting: Family Medicine

## 2015-09-09 ENCOUNTER — Other Ambulatory Visit: Payer: Self-pay

## 2015-09-09 DIAGNOSIS — Z1231 Encounter for screening mammogram for malignant neoplasm of breast: Secondary | ICD-10-CM

## 2015-09-19 ENCOUNTER — Ambulatory Visit
Admission: RE | Admit: 2015-09-19 | Discharge: 2015-09-19 | Disposition: A | Discharge status: Home / Self Care | Payer: 59 | Source: Ambulatory Visit

## 2015-09-19 ENCOUNTER — Ambulatory Visit: Payer: 59

## 2015-09-19 DIAGNOSIS — Z1231 Encounter for screening mammogram for malignant neoplasm of breast: Secondary | ICD-10-CM

## 2015-10-15 ENCOUNTER — Other Ambulatory Visit: Payer: Self-pay | Admitting: Family Medicine

## 2015-10-29 ENCOUNTER — Encounter: Payer: Self-pay | Admitting: Internal Medicine

## 2015-10-29 ENCOUNTER — Telehealth: Payer: Self-pay | Admitting: Internal Medicine

## 2015-10-29 NOTE — Telephone Encounter (Signed)
Pt states she has been having problems with diarrhea, bloating and gas. States it does not seem to be related to certain foods. Requesting to be seen sooner than 1st available. Pt scheduled to see Amy Esterwood PA 11/05/15@3pm . Pt aware of appt.

## 2015-10-30 ENCOUNTER — Telehealth: Payer: Self-pay | Admitting: Physician Assistant

## 2015-10-30 NOTE — Telephone Encounter (Signed)
Discussed with pt that the app schedule was not out past next week and that she should call back the middle of next week to try and get an appt.

## 2015-11-01 ENCOUNTER — Encounter: Payer: Self-pay | Admitting: Family Medicine

## 2015-11-01 ENCOUNTER — Telehealth: Payer: Self-pay | Admitting: Family Medicine

## 2015-11-01 MED ORDER — HYDROCODONE-ACETAMINOPHEN 10-325 MG PO TABS: ORAL_TABLET | ORAL | Status: DC

## 2015-11-01 NOTE — Telephone Encounter (Signed)
Script is ready for pick up and I spoke with pt.  

## 2015-11-01 NOTE — Telephone Encounter (Signed)
Pt need 3 month refill on Hydrocodone

## 2015-11-01 NOTE — Telephone Encounter (Signed)
done

## 2015-11-05 ENCOUNTER — Ambulatory Visit: Payer: 59 | Admitting: Physician Assistant

## 2015-11-05 NOTE — Telephone Encounter (Signed)
Error

## 2015-11-08 NOTE — Telephone Encounter (Signed)
Already done

## 2016-01-29 ENCOUNTER — Encounter: Payer: Self-pay | Admitting: Family Medicine

## 2016-01-30 ENCOUNTER — Encounter: Payer: Self-pay | Admitting: Family Medicine

## 2016-01-31 MED ORDER — HYDROCODONE-ACETAMINOPHEN 10-325 MG PO TABS: ORAL_TABLET | ORAL | 0 refills | Status: DC

## 2016-01-31 NOTE — Telephone Encounter (Signed)
Pt car will be in shop next week and she is calling and would like hydrocodone today. Pt is aware may take up to 3 business days

## 2016-01-31 NOTE — Telephone Encounter (Signed)
This is a duplicate note.  

## 2016-01-31 NOTE — Telephone Encounter (Signed)
done

## 2016-02-28 ENCOUNTER — Other Ambulatory Visit: Payer: Self-pay | Admitting: Family Medicine

## 2016-03-02 ENCOUNTER — Encounter: Payer: Self-pay | Admitting: Family Medicine

## 2016-03-03 MED ORDER — GABAPENTIN 300 MG PO CAPS: 300.0000 mg | ORAL_CAPSULE | Freq: Three times a day (TID) | ORAL | 0 refills | Status: DC

## 2016-04-26 ENCOUNTER — Other Ambulatory Visit: Payer: Self-pay | Admitting: Family Medicine

## 2016-04-29 ENCOUNTER — Ambulatory Visit (INDEPENDENT_AMBULATORY_CARE_PROVIDER_SITE_OTHER): Payer: 59 | Admitting: Family Medicine

## 2016-04-29 ENCOUNTER — Encounter: Payer: Self-pay | Admitting: Family Medicine

## 2016-04-29 VITALS — BP 137/78 | HR 85 | Temp 98.5°F | Ht 64.0 in | Wt 184.0 lb

## 2016-04-29 DIAGNOSIS — M5030 Other cervical disc degeneration, unspecified cervical region: Secondary | ICD-10-CM | POA: Diagnosis not present

## 2016-04-29 DIAGNOSIS — Z209 Contact with and (suspected) exposure to unspecified communicable disease: Secondary | ICD-10-CM | POA: Diagnosis not present

## 2016-04-29 DIAGNOSIS — I10 Essential (primary) hypertension: Secondary | ICD-10-CM

## 2016-04-29 MED ORDER — HYDROCODONE-ACETAMINOPHEN 10-325 MG PO TABS: ORAL_TABLET | ORAL | 0 refills | Status: DC

## 2016-04-29 NOTE — Progress Notes (Signed)
   Subjective:    Patient ID: [name], female    DOB: 1950-10-23, 65 y.o.   MRN: [id number]  HPI Here to follow up on meds and to ask about hepatitis C screening. She has no risk factors that she knows of except for a couple of tattoos. Her neck and back pain are stable.    Review of Systems  Constitutional: Negative.   Respiratory: Negative.   Cardiovascular: Negative.   Gastrointestinal: Negative.   Musculoskeletal: Positive for back pain, neck pain and neck stiffness.  Neurological: Negative.        Objective:   Physical Exam  Constitutional: She is oriented to person, place, and time. She appears well-developed and well-nourished.  Cardiovascular: Normal rate, regular rhythm, normal heart sounds and intact distal pulses.   Pulmonary/Chest: Effort normal and breath sounds normal.  Neurological: She is alert and oriented to person, place, and time.          Assessment & Plan:  Her neck and back pain are stable, meds were refilled. She will set up fasting labs for next week and we will include a hep C screen. Alysia Penna, MD

## 2016-04-29 NOTE — Progress Notes (Signed)
Pre visit review using our clinic review tool, if applicable. No additional management support is needed unless otherwise documented below in the visit note. 

## 2016-05-07 ENCOUNTER — Other Ambulatory Visit: Payer: 59

## 2016-07-24 ENCOUNTER — Other Ambulatory Visit: Payer: Self-pay | Admitting: Family Medicine

## 2016-07-26 ENCOUNTER — Encounter: Payer: Self-pay | Admitting: Family Medicine

## 2016-07-27 MED ORDER — HYDROCODONE-ACETAMINOPHEN 10-325 MG PO TABS: ORAL_TABLET | ORAL | 0 refills | Status: DC

## 2016-07-27 NOTE — Telephone Encounter (Signed)
Script is ready for pick up here at front office, left pt a voice message with this information.

## 2016-07-27 NOTE — Telephone Encounter (Signed)
Done for one month per protocol

## 2016-07-28 NOTE — Telephone Encounter (Signed)
Pt aware.

## 2016-08-03 ENCOUNTER — Ambulatory Visit (INDEPENDENT_AMBULATORY_CARE_PROVIDER_SITE_OTHER): Payer: 59 | Admitting: Family Medicine

## 2016-08-03 ENCOUNTER — Encounter: Payer: Self-pay | Admitting: Family Medicine

## 2016-08-03 VITALS — BP 138/77 | HR 79 | Temp 98.4°F | Ht 64.0 in | Wt 186.0 lb

## 2016-08-03 DIAGNOSIS — N951 Menopausal and female climacteric states: Secondary | ICD-10-CM

## 2016-08-03 DIAGNOSIS — Z720 Tobacco use: Secondary | ICD-10-CM | POA: Diagnosis not present

## 2016-08-03 DIAGNOSIS — I10 Essential (primary) hypertension: Secondary | ICD-10-CM | POA: Diagnosis not present

## 2016-08-03 MED ORDER — METOPROLOL SUCCINATE ER 50 MG PO TB24: 100.0000 mg | ORAL_TABLET | Freq: Every day | ORAL | 1 refills | Status: DC

## 2016-08-03 MED ORDER — VARENICLINE TARTRATE 1 MG PO TABS: 1.0000 mg | ORAL_TABLET | Freq: Two times a day (BID) | ORAL | 1 refills | Status: DC

## 2016-08-03 MED ORDER — VARENICLINE TARTRATE 0.5 MG X 11 & 1 MG X 42 PO MISC: ORAL | 0 refills | Status: DC

## 2016-08-03 NOTE — Progress Notes (Signed)
   Subjective:    Patient ID: [name], female    DOB: 1951/05/02, 66 y.o.   MRN: [id number]  HPI Here for several concerns. First her BP has been creeping up and it is now in the 140s over 90s frequently. She sometimes gets a mild HA when it goes up. Second she asks for help to quit smoking. She has never tried Chantix and she says her insurance will cover it. Third she asks if we can check her hormone levels. She had a TAH in 1995 and she began to have hot flashes shortly after that. Now she still has some hot flashes and she feels tired all the time.    Review of Systems  Constitutional: Positive for fatigue.  Respiratory: Negative.   Cardiovascular: Negative.   Neurological: Positive for headaches.       Objective:   Physical Exam  Constitutional: She is oriented to person, place, and time. She appears well-developed and well-nourished.  Neck: No thyromegaly present.  Cardiovascular: Normal rate, regular rhythm, normal heart sounds and intact distal pulses.   Pulmonary/Chest: Effort normal and breath sounds normal.  Musculoskeletal: She exhibits no edema.  Lymphadenopathy:    She has no cervical adenopathy.  Neurological: She is alert and oriented to person, place, and time.          Assessment & Plan:  For the HTN, we will increase Metoprolol to 2 tabs a Aboud (total of 100 mg). For the fatigue and hot flashes, set up labs soon. To quit smoking, she will try Chantix for 90 days. Recheck in 2-3 weeks.  Alysia Penna, MD

## 2016-08-03 NOTE — Patient Instructions (Signed)
WE NOW OFFER   [facility] FAST TRACK!!!  S[facility] Appointments for ACUTE CARE  Such as: Sprains, Injuries, cuts, abrasions, rashes, muscle pain, joint pain, back pain Colds, flu, sore throats, headache, allergies, cough, fever  Ear pain, sinus and eye infections Abdominal pain, nausea, vomiting, diarrhea, upset stomach Animal/insect bites  3 Easy Ways to Schedule: Walk-In Scheduling Call in scheduling Mychart Sign-up: [email or web address]         

## 2016-08-03 NOTE — Progress Notes (Signed)
Pre visit review using our clinic review tool, if applicable. No additional management support is needed unless otherwise documented below in the visit note. 

## 2016-08-04 ENCOUNTER — Other Ambulatory Visit (INDEPENDENT_AMBULATORY_CARE_PROVIDER_SITE_OTHER): Payer: 59

## 2016-08-04 DIAGNOSIS — I10 Essential (primary) hypertension: Secondary | ICD-10-CM | POA: Diagnosis not present

## 2016-08-04 DIAGNOSIS — Z209 Contact with and (suspected) exposure to unspecified communicable disease: Secondary | ICD-10-CM

## 2016-08-04 DIAGNOSIS — N951 Menopausal and female climacteric states: Secondary | ICD-10-CM | POA: Diagnosis not present

## 2016-08-04 LAB — CBC WITH DIFFERENTIAL/PLATELET
BASOS PCT: 0.4 % (ref 0.0–3.0)
Basophils Absolute: 0 10*3/uL (ref 0.0–0.1)
EOS PCT: 1.7 % (ref 0.0–5.0)
Eosinophils Absolute: 0.2 10*3/uL (ref 0.0–0.7)
HCT: 38.5 % (ref 36.0–46.0)
HEMOGLOBIN: 12.5 g/dL (ref 12.0–15.0)
LYMPHS PCT: 29 % (ref 12.0–46.0)
Lymphs Abs: 3.1 10*3/uL (ref 0.7–4.0)
MCHC: 32.3 g/dL (ref 30.0–36.0)
MCV: 82.2 fl (ref 78.0–100.0)
Monocytes Absolute: 0.9 10*3/uL (ref 0.1–1.0)
Monocytes Relative: 8 % (ref 3.0–12.0)
NEUTROS ABS: 6.6 10*3/uL (ref 1.4–7.7)
Neutrophils Relative %: 60.9 % (ref 43.0–77.0)
PLATELETS: 309 10*3/uL (ref 150.0–400.0)
RBC: 4.69 Mil/uL (ref 3.87–5.11)
RDW: 14.5 % (ref 11.5–15.5)
WBC: 10.8 10*3/uL — AB (ref 4.0–10.5)

## 2016-08-04 LAB — BASIC METABOLIC PANEL
BUN: 13 mg/dL (ref 6–23)
CALCIUM: 9 mg/dL (ref 8.4–10.5)
CHLORIDE: 106 meq/L (ref 96–112)
CO2: 30 meq/L (ref 19–32)
CREATININE: 0.59 mg/dL (ref 0.40–1.20)
GFR: 109 mL/min (ref >60.00)
Glucose, Bld: 90 mg/dL (ref 70–99)
Potassium: 4.7 mEq/L (ref 3.5–5.1)
SODIUM: 140 meq/L (ref 135–145)

## 2016-08-04 LAB — HEPATIC FUNCTION PANEL
ALK PHOS: 103 U/L (ref 39–117)
ALT: 7 U/L (ref 0–35)
AST: 9 U/L (ref 0–37)
Albumin: 3.9 g/dL (ref 3.5–5.2)
BILIRUBIN DIRECT: 0.1 mg/dL (ref 0.0–0.3)
BILIRUBIN TOTAL: 0.4 mg/dL (ref 0.2–1.2)
Total Protein: 6.8 g/dL (ref 6.0–8.3)

## 2016-08-04 LAB — LIPID PANEL
Cholesterol: 132 mg/dL (ref 0–200)
HDL: 38.4 mg/dL — ABNORMAL LOW
LDL Cholesterol: 76 mg/dL (ref 0–99)
NonHDL: 93.21
Total CHOL/HDL Ratio: 3
Triglycerides: 85 mg/dL (ref 0.0–149.0)
VLDL: 17 mg/dL (ref 0.0–40.0)

## 2016-08-04 LAB — TSH: TSH: 0.98 u[IU]/mL (ref 0.35–4.50)

## 2016-08-04 LAB — FOLLICLE STIMULATING HORMONE: FSH: 67 m[IU]/mL

## 2016-08-04 LAB — LUTEINIZING HORMONE: LH: 74.34 m[IU]/mL

## 2016-08-05 LAB — HEPATITIS C ANTIBODY: HCV Ab: NEGATIVE

## 2016-08-21 ENCOUNTER — Encounter: Payer: Self-pay | Admitting: Family Medicine

## 2016-08-21 ENCOUNTER — Ambulatory Visit (INDEPENDENT_AMBULATORY_CARE_PROVIDER_SITE_OTHER): Payer: 59 | Admitting: Family Medicine

## 2016-08-21 VITALS — BP 124/66 | HR 87 | Temp 98.0°F | Ht 64.0 in | Wt 187.0 lb

## 2016-08-21 DIAGNOSIS — I10 Essential (primary) hypertension: Secondary | ICD-10-CM

## 2016-08-21 MED ORDER — METOPROLOL SUCCINATE ER 100 MG PO TB24: 100.0000 mg | ORAL_TABLET | Freq: Every day | ORAL | 3 refills | Status: DC

## 2016-08-21 MED ORDER — TRIAMCINOLONE ACETONIDE 0.1 % EX OINT: 1.0000 "application " | TOPICAL_OINTMENT | Freq: Two times a day (BID) | CUTANEOUS | 5 refills | Status: DC

## 2016-08-21 NOTE — Progress Notes (Signed)
Pre visit review using our clinic review tool, if applicable. No additional management support is needed unless otherwise documented below in the visit note. 

## 2016-08-21 NOTE — Patient Instructions (Signed)
WE NOW OFFER   [facility] FAST TRACK!!!  SAME [place] Appointments for ACUTE CARE  Such as: Sprains, Injuries, cuts, abrasions, rashes, muscle pain, joint pain, back pain Colds, flu, sore throats, headache, allergies, cough, fever  Ear pain, sinus and eye infections Abdominal pain, nausea, vomiting, diarrhea, upset stomach Animal/insect bites  3 Easy Ways to Schedule: Walk-In Scheduling Call in scheduling Mychart Sign-up: [email or web address]         

## 2016-08-21 NOTE — Progress Notes (Signed)
   Subjective:    Patient ID: Einar Gip Graddy, female    DOB: 1950-10-13, 66 y.o.   MRN: [id number]  HPI Here to recheck her BP. A few weeks ago we increased her Metoprolol to a total of 200 mg daily, and she has felt fine. Her BP is now well controlled.    Review of Systems  Constitutional: Negative.   Respiratory: Negative.   Cardiovascular: Negative.   Neurological: Negative.        Objective:   Physical Exam  Constitutional: She is oriented to person, place, and time. She appears well-developed and well-nourished.  Neck: No thyromegaly present.  Cardiovascular: Normal rate, regular rhythm, normal heart sounds and intact distal pulses.   Pulmonary/Chest: Effort normal and breath sounds normal.  Lymphadenopathy:    She has no cervical adenopathy.  Neurological: She is alert and oriented to person, place, and time.          Assessment & Plan:  HTN, now well controlled. Recheck in 6 months. Alysia Penna, MD

## 2016-08-22 ENCOUNTER — Encounter: Payer: Self-pay | Admitting: Family Medicine

## 2016-08-27 ENCOUNTER — Encounter: Payer: Self-pay | Admitting: Family Medicine

## 2016-08-31 ENCOUNTER — Telehealth: Payer: Self-pay | Admitting: Family Medicine

## 2016-08-31 MED ORDER — HYDROCODONE-ACETAMINOPHEN 10-325 MG PO TABS: ORAL_TABLET | ORAL | 0 refills | Status: DC

## 2016-08-31 NOTE — Telephone Encounter (Signed)
done

## 2016-08-31 NOTE — Telephone Encounter (Signed)
Pt need new Rx for Hydrocodone she sent a mychart msg on 4/19 for the refill and would like to pick it up on 09/01/16.  Pt is aware of 3 business days for refills and someone will call when ready for pick up.

## 2016-09-01 NOTE — Telephone Encounter (Signed)
Pt is aware Rx is ready for pick up Rx up front.

## 2016-09-28 ENCOUNTER — Telehealth: Payer: Self-pay | Admitting: Family Medicine

## 2016-09-28 MED ORDER — HYDROCODONE-ACETAMINOPHEN 10-325 MG PO TABS: ORAL_TABLET | ORAL | 0 refills | Status: DC

## 2016-09-28 NOTE — Telephone Encounter (Signed)
Pt need new Rx for Hydrocodone   Pt is aware of 3 business days for refills and someone will call when ready for pick up. °

## 2016-09-28 NOTE — Telephone Encounter (Signed)
done

## 2016-09-28 NOTE — Telephone Encounter (Signed)
Is this ok to refill?  

## 2016-09-28 NOTE — Telephone Encounter (Signed)
Patient notified that rx is ready to be picked up.  

## 2016-10-15 ENCOUNTER — Ambulatory Visit (HOSPITAL_COMMUNITY)
Admission: EM | Admit: 2016-10-15 | Discharge: 2016-10-15 | Disposition: A | Discharge status: Home / Self Care | Payer: 59 | Attending: Internal Medicine | Admitting: Internal Medicine

## 2016-10-15 ENCOUNTER — Encounter (HOSPITAL_COMMUNITY): Payer: Self-pay | Admitting: Emergency Medicine

## 2016-10-15 DIAGNOSIS — R05 Cough: Secondary | ICD-10-CM | POA: Diagnosis not present

## 2016-10-15 DIAGNOSIS — J209 Acute bronchitis, unspecified: Secondary | ICD-10-CM

## 2016-10-15 DIAGNOSIS — R059 Cough, unspecified: Secondary | ICD-10-CM

## 2016-10-15 MED ORDER — AZITHROMYCIN 250 MG PO TABS: 250.0000 mg | ORAL_TABLET | Freq: Every day | ORAL | 0 refills | Status: DC

## 2016-10-15 MED ORDER — BENZONATATE 100 MG PO CAPS: 200.0000 mg | ORAL_CAPSULE | Freq: Three times a day (TID) | ORAL | 0 refills | Status: DC | PRN

## 2016-10-15 MED ORDER — ALBUTEROL SULFATE HFA 108 (90 BASE) MCG/ACT IN AERS: 1.0000 | INHALATION_SPRAY | Freq: Four times a day (QID) | RESPIRATORY_TRACT | 0 refills | Status: DC | PRN

## 2016-10-15 MED ORDER — METHYLPREDNISOLONE 4 MG PO TBPK: ORAL_TABLET | ORAL | 0 refills | Status: DC

## 2016-10-15 NOTE — ED Provider Notes (Signed)
CSN: [id number]     Arrival date & time 10/15/16  1920 History   None    Chief Complaint  Patient presents with  . Cough   (Consider location/radiation/quality/duration/timing/severity/associated sxs/prior Treatment) Patient c/o cough and uri sx's for a week.  She is a smoker and cough is worst at night and she wheezes.   The history is provided by the patient.  Cough  Cough characteristics:  Productive Sputum characteristics:  Yellow Severity:  Moderate Onset quality:  Sudden Duration:  1 week Timing:  Constant   Past Medical History:  Diagnosis Date  . Arthritis   . Headache(784.0)   . Hypertension   . Neuropathy   . Urinary incontinence   . Wears contact lenses   . Wears dentures    Past Surgical History:  Procedure Laterality Date  . ABDOMINAL HYSTERECTOMY     oophorectomy  . APPENDECTOMY    . CERVICAL FUSION  2009   and plating  . DILATION AND CURETTAGE OF UTERUS    . EAR CYST EXCISION Right 07/05/2014   Procedure: EXCISION FLEXOR SHEATH CYST WITH RELEASE A-1 PULLEY RIGHT SMALL FINGER;  Surgeon: Leanora Cover, MD;  Location: [facility];  Service: Orthopedics;  Laterality: Right;  . STOMACH SURGERY    . TRIGGER FINGER RELEASE Right 07/05/2014   Procedure: RELEASE RIGHT SMALL FINGER/A-1 PULLEY;  Surgeon: Leanora Cover, MD;  Location: [facility];  Service: Orthopedics;  Laterality: Right;  . TUBOPLASTY / TUBOTUBAL ANASTOMOSIS     Family History  Problem Relation Age of Onset  . Arthritis Unknown   . Breast cancer Unknown   . Hypertension Unknown   . Colon cancer Neg Hx    Social History  Substance Use Topics  . Smoking status: Former Smoker    Packs/Hoffert: 0.50    Types: Cigarettes  . Smokeless tobacco: Never Used     Comment: 3 weeks ago  . Alcohol use No   OB History    No data available     Review of Systems  Constitutional: Positive for fatigue.  HENT: Negative.   Eyes: Negative.   Respiratory: Positive for cough.    Cardiovascular: Negative.   Gastrointestinal: Negative.   Endocrine: Negative.   Genitourinary: Negative.   Musculoskeletal: Negative.   Allergic/Immunologic: Negative.   Neurological: Negative.   Hematological: Negative.   Psychiatric/Behavioral: Negative.     Allergies  Lisinopril and Penicillins  Home Medications   Prior to Admission medications   Medication Sig Start Date End Date Taking? Authorizing Provider  cholecalciferol (VITAMIN D) 1000 units tablet Take 1,000 Units by mouth daily.   Yes [provider]  albuterol (PROVENTIL HFA;VENTOLIN HFA) 108 (90 Base) MCG/ACT inhaler Inhale 1-2 puffs into the lungs every 6 (six) hours as needed for wheezing or shortness of breath. 10/15/16   Lysbeth Penner, FNP  azithromycin (ZITHROMAX) 250 MG tablet Take 1 tablet (250 mg total) by mouth daily. Take first 2 tablets together, then 1 every [place] until finished. 10/15/16   Lysbeth Penner, FNP  BEE POLLEN PO Take by mouth.    [provider]  benzonatate (TESSALON) 100 MG capsule Take 2 capsules (200 mg total) by mouth 3 (three) times daily as needed for cough. 10/15/16   Lysbeth Penner, FNP  gabapentin (NEURONTIN) 300 MG capsule TAKE 1 CAPSULE BY MOUTH 3  TIMES DAILY 07/24/16   Laurey Morale, MD  HYDROcodone-acetaminophen (NORCO) 10-325 MG tablet TAKE 1 TABLET BY MOUTH EVERY 6 HOURS  AS NEEDED FOR PAIN 09/28/16   Laurey Morale, MD  methylPREDNISolone (MEDROL DOSEPAK) 4 MG TBPK tablet Take 6-5-4-3-2-1 po qd 10/15/16   Lysbeth Penner, FNP  metoprolol succinate (TOPROL-XL) 100 MG 24 hr tablet Take 1 tablet (100 mg total) by mouth daily. Take with or immediately following a meal. 08/21/16   Laurey Morale, MD  Multiple Vitamin (MULTIVITAMIN WITH MINERALS) TABS Take 1 tablet by mouth daily.    [provider]  Omega 3 1000 MG CAPS Take 1 capsule by mouth daily.    [provider]  triamcinolone ointment (KENALOG) 0.1 % Apply 1 application topically 2 (two)  times daily. 08/21/16   Laurey Morale, MD  varenicline (CHANTIX CONTINUING MONTH PAK) 1 MG tablet Take 1 tablet (1 mg total) by mouth 2 (two) times daily. Patient not taking: Reported on 08/21/2016 08/03/16   Laurey Morale, MD  varenicline (CHANTIX STARTING MONTH PAK) 0.5 MG X 11 & 1 MG X 42 tablet Take one 0.5 mg tablet by mouth once daily for 3 days, then increase to one 0.5 mg tablet twice daily for 4 days, then increase to one 1 mg tablet twice daily. Patient not taking: Reported on 08/21/2016 08/03/16   Laurey Morale, MD  vitamin B-12 (CYANOCOBALAMIN) 500 MCG tablet Take 500 mcg by mouth 2 (two) times daily.     [provider]   Meds Ordered and Administered this Visit  Medications - No data to display  BP (!) 162/72 (BP Location: Left Arm)   Pulse 75   Temp 98.2 F (36.8 C) (Oral)   Resp 20   SpO2 97%  No data found.   Physical Exam  Constitutional: She is oriented to person, place, and time. She appears well-developed and well-nourished.  HENT:  Head: Normocephalic and atraumatic.  Right Ear: External ear normal.  Left Ear: External ear normal.  Mouth/Throat: Oropharynx is clear and moist.  Eyes: Conjunctivae and EOM are normal. Pupils are equal, round, and reactive to light.  Neck: Normal range of motion. Neck supple.  Cardiovascular: Normal rate, regular rhythm and normal heart sounds.   Pulmonary/Chest: Effort normal and breath sounds normal.  Abdominal: Soft. Bowel sounds are normal.  Musculoskeletal: Normal range of motion.  Neurological: She is alert and oriented to person, place, and time.  Nursing note and vitals reviewed.   Urgent Care Course     Procedures (including critical care time)  Labs Review Labs Reviewed - No data to display  Imaging Review No results found.   Visual Acuity Review  Right Eye Distance:   Left Eye Distance:   Bilateral Distance:    Right Eye Near:   Left Eye Near:    Bilateral Near:         MDM   1. Cough    2. Acute bronchitis, unspecified organism    Zpak Albuterol Medrol dose pack Tessalon Perles  Push po fluids, rest, tylenol and motrin otc prn as directed for fever, arthralgias, and myalgias.  Follow up prn if sx's continue or persist.    Lysbeth Penner, FNP 10/15/16 2109

## 2016-10-15 NOTE — ED Triage Notes (Signed)
Cough for a week.  No fever.  Patient has body aches.  Cough is congested and keeps her up at night

## 2016-10-27 ENCOUNTER — Encounter: Payer: Self-pay | Admitting: Family Medicine

## 2016-10-27 MED ORDER — HYDROCODONE-ACETAMINOPHEN 10-325 MG PO TABS: ORAL_TABLET | ORAL | 0 refills | Status: DC

## 2016-10-27 NOTE — Telephone Encounter (Signed)
done

## 2016-10-30 ENCOUNTER — Ambulatory Visit (HOSPITAL_COMMUNITY)
Admission: EM | Admit: 2016-10-30 | Discharge: 2016-10-30 | Disposition: A | Discharge status: Home / Self Care | Payer: 59 | Attending: Family Medicine | Admitting: Family Medicine

## 2016-10-30 ENCOUNTER — Other Ambulatory Visit: Payer: Self-pay | Admitting: Family Medicine

## 2016-10-30 ENCOUNTER — Ambulatory Visit (HOSPITAL_COMMUNITY): Payer: 59

## 2016-10-30 ENCOUNTER — Encounter (HOSPITAL_COMMUNITY): Payer: Self-pay | Admitting: Emergency Medicine

## 2016-10-30 DIAGNOSIS — Z1231 Encounter for screening mammogram for malignant neoplasm of breast: Secondary | ICD-10-CM

## 2016-10-30 DIAGNOSIS — M436 Torticollis: Secondary | ICD-10-CM

## 2016-10-30 MED ORDER — PREDNISONE 20 MG PO TABS: ORAL_TABLET | ORAL | 0 refills | Status: DC

## 2016-10-30 MED ORDER — CYCLOBENZAPRINE HCL 5 MG PO TABS: 5.0000 mg | ORAL_TABLET | Freq: Every day | ORAL | 0 refills | Status: DC

## 2016-10-30 NOTE — ED Triage Notes (Signed)
Pt c/o right side neck pain onset 1 week ... Hurts to turn head to the left or right  Applying warm/cold compression and messaging w/no relief.   Denies inj/trauma... A&O x4... NAD... Ambulatory

## 2016-10-30 NOTE — ED Provider Notes (Signed)
[facility]    CSN: [id number] Arrival date & time: 10/30/16  1604     History   Chief Complaint Chief Complaint  Patient presents with  . Neck Pain    HPI [name] is a 66 y.o. female.   Pt c/o right side neck pain onset 1 week ... Hurts to turn head to the left or right  Applying warm/cold compression and messaging w/no relief.   Denies inj/trauma... A&O x4... NAD... Ambulatory   Patient has had cervical spine surgery in the past and is worried that the plate or screws might have shifted.  Patient works for [employer]      Past Medical History:  Diagnosis Date  . Arthritis   . Headache(784.0)   . Hypertension   . Neuropathy   . Urinary incontinence   . Wears contact lenses   . Wears dentures     Patient Active Problem List   Diagnosis Date Noted  . Tobacco abuse 05/03/2012  . HTN (hypertension) 12/09/2011  . INFLAMED SEBORRHEIC KERATOSIS 04/29/2010  . 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 EDEMA 01/01/2009  . CERUMEN IMPACTION 10/19/2008  . HEADACHE 10/19/2008  . URINARY INCONTINENCE 10/19/2008    Past Surgical History:  Procedure Laterality Date  . ABDOMINAL HYSTERECTOMY     oophorectomy  . APPENDECTOMY    . CERVICAL FUSION  2009   and plating  . DILATION AND CURETTAGE OF UTERUS    . EAR CYST EXCISION Right 07/05/2014   Procedure: EXCISION FLEXOR SHEATH CYST WITH RELEASE A-1 PULLEY RIGHT SMALL FINGER;  Surgeon: Leanora Cover, MD;  Location: [facility];  Service: Orthopedics;  Laterality: Right;  . STOMACH SURGERY    . TRIGGER FINGER RELEASE Right 07/05/2014   Procedure: RELEASE RIGHT SMALL FINGER/A-1 PULLEY;  Surgeon: Leanora Cover, MD;  Location: [facility];  Service: Orthopedics;  Laterality: Right;  . TUBOPLASTY / TUBOTUBAL ANASTOMOSIS      OB History    No data available       Home Medications    Prior to Admission medications   Medication Sig Start  Date End Date Taking? Authorizing Provider  BEE POLLEN PO Take by mouth.   Yes [provider]  cholecalciferol (VITAMIN D) 1000 units tablet Take 1,000 Units by mouth daily.   Yes [provider]  gabapentin (NEURONTIN) 300 MG capsule TAKE 1 CAPSULE BY MOUTH 3  TIMES DAILY 07/24/16  Yes Laurey Morale, MD  HYDROcodone-acetaminophen (NORCO) 10-325 MG tablet TAKE 1 TABLET BY MOUTH EVERY 6 HOURS AS NEEDED FOR PAIN 10/27/16  Yes Laurey Morale, MD  metoprolol succinate (TOPROL-XL) 100 MG 24 hr tablet Take 1 tablet (100 mg total) by mouth daily. Take with or immediately following a meal. 08/21/16  Yes Laurey Morale, MD  Multiple Vitamin (MULTIVITAMIN WITH MINERALS) TABS Take 1 tablet by mouth daily.   Yes [provider]  Omega 3 1000 MG CAPS Take 1 capsule by mouth daily.   Yes [provider]  vitamin B-12 (CYANOCOBALAMIN) 500 MCG tablet Take 500 mcg by mouth 2 (two) times daily.    Yes [provider]  albuterol (PROVENTIL HFA;VENTOLIN HFA) 108 (90 Base) MCG/ACT inhaler Inhale 1-2 puffs into the lungs every 6 (six) hours as needed for wheezing or shortness of breath. 10/15/16   Lysbeth Penner, FNP  azithromycin (ZITHROMAX) 250 MG tablet Take 1 tablet (250 mg total) by mouth daily. Take first 2 tablets  together, then 1 every Dax until finished. 10/15/16   Lysbeth Penner, FNP  benzonatate (TESSALON) 100 MG capsule Take 2 capsules (200 mg total) by mouth 3 (three) times daily as needed for cough. 10/15/16   Lysbeth Penner, FNP  methylPREDNISolone (MEDROL DOSEPAK) 4 MG TBPK tablet Take 6-5-4-3-2-1 po qd 10/15/16   Lysbeth Penner, FNP  triamcinolone ointment (KENALOG) 0.1 % Apply 1 application topically 2 (two) times daily. 08/21/16   Laurey Morale, MD  varenicline (CHANTIX CONTINUING MONTH PAK) 1 MG tablet Take 1 tablet (1 mg total) by mouth 2 (two) times daily. Patient not taking: Reported on 08/21/2016 08/03/16   Laurey Morale, MD  varenicline (CHANTIX  STARTING MONTH PAK) 0.5 MG X 11 & 1 MG X 42 tablet Take one 0.5 mg tablet by mouth once daily for 3 days, then increase to one 0.5 mg tablet twice daily for 4 days, then increase to one 1 mg tablet twice daily. Patient not taking: Reported on 08/21/2016 08/03/16   Laurey Morale, MD    Family History Family History  Problem Relation Age of Onset  . Arthritis Unknown   . Breast cancer Unknown   . Hypertension Unknown   . Colon cancer Neg Hx     Social History Social History  Substance Use Topics  . Smoking status: Former Smoker    Packs/Zywicki: 0.50    Types: Cigarettes  . Smokeless tobacco: Never Used     Comment: 3 weeks ago  . Alcohol use No     Allergies   Lisinopril and Penicillins   Review of Systems Review of Systems  Musculoskeletal: Positive for neck pain.  Neurological: Negative for weakness and numbness.  All other systems reviewed and are negative.    Physical Exam Triage Vital Signs ED Triage Vitals [10/30/16 1624]  Enc Vitals Group     BP (!) 149/78     Pulse Rate 77     Resp 20     Temp 98.5 F (36.9 C)     Temp Source Oral     SpO2 96 %     Weight      Height      Head Circumference      Peak Flow      Pain Score 5     Pain Loc      Pain Edu?      Excl. in [place]?    No data found.   Updated Vital Signs BP (!) 149/78 (BP Location: Right Arm)   Pulse 77   Temp 98.5 F (36.9 C) (Oral)   Resp 20   SpO2 96%    Physical Exam  Constitutional: She is oriented to person, place, and time. She appears well-developed and well-nourished.  HENT:  Head: Normocephalic.  Right Ear: External ear normal.  Left Ear: External ear normal.  Mouth/Throat: Oropharynx is clear and moist.  Eyes: Conjunctivae are normal.  Right esophoria  Neck: Neck supple.  Pain rotating head to the right  Pulmonary/Chest: Effort normal.  Musculoskeletal: Normal range of motion.  Neurological: She is alert and oriented to person, place, and time.  Skin: Skin is warm and  dry.  Nursing note and vitals reviewed.    UC Treatments / Results  Labs (all labs ordered are listed, but only abnormal results are displayed) Labs Reviewed - No data to display  EKG  EKG Interpretation None       Radiology No results found.  Procedures Procedures (including critical  care time)  Medications Ordered in UC Medications - No data to display   Initial Impression / Assessment and Plan / UC Course  I have reviewed the triage vital signs and the nursing notes.  Pertinent labs & imaging results that were available during my care of the patient were reviewed by me and considered in my medical decision making (see chart for details).     Final Clinical Impressions(s) / UC Diagnoses   Final diagnoses:  None    New Prescriptions New Prescriptions   No medications on file     Robyn Haber, MD 10/30/16 1651

## 2016-11-20 ENCOUNTER — Ambulatory Visit
Admission: RE | Admit: 2016-11-20 | Discharge: 2016-11-20 | Disposition: A | Discharge status: Home / Self Care | Payer: 59 | Source: Ambulatory Visit | Attending: Family Medicine | Admitting: Family Medicine

## 2016-11-20 DIAGNOSIS — Z1231 Encounter for screening mammogram for malignant neoplasm of breast: Secondary | ICD-10-CM

## 2016-11-24 ENCOUNTER — Telehealth: Payer: Self-pay | Admitting: Family Medicine

## 2016-11-24 MED ORDER — HYDROCODONE-ACETAMINOPHEN 10-325 MG PO TABS: ORAL_TABLET | ORAL | 0 refills | Status: DC

## 2016-11-24 NOTE — Telephone Encounter (Signed)
Script is ready for pick up here at front office and left a message for pt.

## 2016-11-24 NOTE — Telephone Encounter (Signed)
Pt would like new rx hydrocodone °

## 2016-11-24 NOTE — Telephone Encounter (Signed)
done

## 2016-12-04 ENCOUNTER — Other Ambulatory Visit: Payer: Self-pay | Admitting: Family Medicine

## 2016-12-28 ENCOUNTER — Encounter: Payer: Self-pay | Admitting: Family Medicine

## 2016-12-28 MED ORDER — HYDROCODONE-ACETAMINOPHEN 10-325 MG PO TABS: ORAL_TABLET | ORAL | 0 refills | Status: DC

## 2016-12-28 NOTE — Telephone Encounter (Signed)
Last filled 7/17 Qty: 120

## 2016-12-28 NOTE — Telephone Encounter (Signed)
Pt would like hydrocodone on this Friday 01/01/17

## 2016-12-28 NOTE — Telephone Encounter (Signed)
Ready to pick up.  

## 2016-12-29 ENCOUNTER — Encounter: Payer: Self-pay | Admitting: Family Medicine

## 2017-01-23 ENCOUNTER — Encounter: Payer: Self-pay | Admitting: Family Medicine

## 2017-01-25 MED ORDER — HYDROCODONE-ACETAMINOPHEN 10-325 MG PO TABS
ORAL_TABLET | ORAL | 0 refills | Status: DC
Start: 2017-01-25 — End: 2017-02-24

## 2017-01-25 NOTE — Telephone Encounter (Signed)
Done

## 2017-01-26 ENCOUNTER — Encounter: Payer: Self-pay | Admitting: Family Medicine

## 2017-01-28 ENCOUNTER — Encounter: Payer: Self-pay | Admitting: Family Medicine

## 2017-02-22 ENCOUNTER — Other Ambulatory Visit: Payer: Self-pay | Admitting: Family Medicine

## 2017-02-23 NOTE — Telephone Encounter (Signed)
Can we refill this? 

## 2017-02-24 ENCOUNTER — Encounter: Payer: Self-pay | Admitting: Family Medicine

## 2017-02-24 MED ORDER — HYDROCODONE-ACETAMINOPHEN 10-325 MG PO TABS: ORAL_TABLET | ORAL | 0 refills | Status: DC

## 2017-02-24 NOTE — Telephone Encounter (Signed)
Done

## 2017-03-03 ENCOUNTER — Ambulatory Visit: Payer: 59 | Admitting: Family Medicine

## 2017-03-03 DIAGNOSIS — Z0289 Encounter for other administrative examinations: Secondary | ICD-10-CM

## 2017-03-25 ENCOUNTER — Encounter: Payer: Self-pay | Admitting: Family Medicine

## 2017-03-25 MED ORDER — HYDROCODONE-ACETAMINOPHEN 10-325 MG PO TABS: ORAL_TABLET | ORAL | 0 refills | Status: DC

## 2017-03-25 NOTE — Telephone Encounter (Signed)
Done

## 2017-04-26 ENCOUNTER — Encounter: Payer: Self-pay | Admitting: Family Medicine

## 2017-04-27 MED ORDER — HYDROCODONE-ACETAMINOPHEN 10-325 MG PO TABS: ORAL_TABLET | ORAL | 0 refills | Status: DC

## 2017-04-27 NOTE — Telephone Encounter (Signed)
This was emailed in, but she will need a PMV for the next refill

## 2017-05-14 ENCOUNTER — Telehealth: Payer: Self-pay | Admitting: Family Medicine

## 2017-05-14 NOTE — Telephone Encounter (Signed)
Copied from [place]. Topic: Inquiry >> May 14, 2017  2:15 PM Corie Chiquito, [place] wrote: Reason for CRM: Vicente Males called because she would like to know if you all had received a fax from there office about this patient.  If someone could give her a call back at [phone]

## 2017-05-19 ENCOUNTER — Ambulatory Visit (INDEPENDENT_AMBULATORY_CARE_PROVIDER_SITE_OTHER): Payer: 59 | Admitting: Family Medicine

## 2017-05-19 ENCOUNTER — Encounter: Payer: Self-pay | Admitting: Family Medicine

## 2017-05-19 VITALS — BP 132/74 | HR 71 | Temp 98.5°F | Wt 181.4 lb

## 2017-05-19 DIAGNOSIS — M5030 Other cervical disc degeneration, unspecified cervical region: Secondary | ICD-10-CM | POA: Diagnosis not present

## 2017-05-19 DIAGNOSIS — F1190 Opioid use, unspecified, uncomplicated: Secondary | ICD-10-CM

## 2017-05-19 MED ORDER — HYDROCODONE-ACETAMINOPHEN 10-325 MG PO TABS: ORAL_TABLET | ORAL | 0 refills | Status: DC

## 2017-05-19 NOTE — Telephone Encounter (Signed)
Called and spoke with [name] advised him that no we have NOT received any faxes regarding pt. I gave [name] our fax number he will refax the request for pt's medical records fo life insurance purposes

## 2017-05-19 NOTE — Progress Notes (Signed)
   Subjective:    Patient ID: Einar Gip Phillis, female    DOB: [birth year], 67 y.o.   MRN: [id number]  HPI Here for pain management. Her neck pain is doing well.  Indication for chronic opioid: chronic neck pain Medication and dose: Norco 10-325 # pills per month: 120 Last UDS date: 05-19-17 Pain contract signed (Y/N): 05-19-17 Date narcotic database last reviewed (include red flags): 05-19-17     Review of Systems  Constitutional: Negative.   Respiratory: Negative.   Cardiovascular: Negative.   Musculoskeletal: Positive for neck pain.  Neurological: Negative.        Objective:   Physical Exam  Constitutional: She is oriented to person, place, and time. She appears well-developed and well-nourished.  Cardiovascular: Normal rate, regular rhythm, normal heart sounds and intact distal pulses.  Pulmonary/Chest: Effort normal and breath sounds normal. No respiratory distress. She has no wheezes. She has no rales.  Neurological: She is alert and oriented to person, place, and time.          Assessment & Plan:  Neck pain, stable. Meds were refilled . Alysia Penna, MD

## 2017-05-23 LAB — PAIN MGMT, PROFILE 8 W/CONF, U
6 ACETYLMORPHINE: NEGATIVE ng/mL (ref <10)
ALCOHOL METABOLITES: NEGATIVE ng/mL (ref <500)
Amphetamines: NEGATIVE ng/mL (ref <500)
Benzodiazepines: NEGATIVE ng/mL (ref <100)
Buprenorphine, Urine: NEGATIVE ng/mL (ref <5)
CREATININE: 60.6 mg/dL
Cocaine Metabolite: NEGATIVE ng/mL (ref <150)
Codeine: NEGATIVE ng/mL (ref <50)
Hydrocodone: 1343 ng/mL — ABNORMAL HIGH (ref <50)
Hydromorphone: 716 ng/mL — ABNORMAL HIGH (ref <50)
MDMA: NEGATIVE ng/mL (ref <500)
MORPHINE: NEGATIVE ng/mL (ref <50)
Marijuana Metabolite: NEGATIVE ng/mL (ref <20)
NORHYDROCODONE: 2692 ng/mL — AB (ref <50)
OPIATES: POSITIVE ng/mL — AB (ref <100)
OXIDANT: NEGATIVE ug/mL (ref <200)
Oxycodone: NEGATIVE ng/mL (ref <100)
PH: 6.65 (ref 4.5–9.0)

## 2017-05-25 NOTE — Telephone Encounter (Signed)
I would start over with this and call pt to find out what is needed?

## 2017-05-25 NOTE — Telephone Encounter (Signed)
Spoke w/ patient, she states she was trying to get additional paperwork filled out for her life insurance. She made a copy and will bring it by the office tomorrow morning.

## 2017-05-25 NOTE — Telephone Encounter (Signed)
Sunday Spillers I need your help with this. I have NOT received any fax on this pt and I am unsure what I need to do. I've called three times to have this faxed to [place] and I don't have anything.

## 2017-06-01 NOTE — Telephone Encounter (Signed)
No, CRM created informing PEC to notify patient that her records request has been forwarded to Medical Records.

## 2017-06-01 NOTE — Telephone Encounter (Signed)
Pt returned call, please call back at 3 pm when she goes to lunch. [phone]

## 2017-06-01 NOTE — Telephone Encounter (Addendum)
Paperwork received is medical records request for records from the last 5 years. Will forward to Medical Records for processing.  Called patient and left message to return call to notify her that request is being sent to Medical Records.

## 2017-06-01 NOTE — Telephone Encounter (Signed)
Is there anything else we need to do with this? I did try to reach pt and no answer.

## 2017-06-09 ENCOUNTER — Other Ambulatory Visit (INDEPENDENT_AMBULATORY_CARE_PROVIDER_SITE_OTHER): Payer: 59

## 2017-06-09 ENCOUNTER — Ambulatory Visit (INDEPENDENT_AMBULATORY_CARE_PROVIDER_SITE_OTHER): Payer: 59 | Admitting: Gastroenterology

## 2017-06-09 ENCOUNTER — Encounter: Payer: Self-pay | Admitting: Gastroenterology

## 2017-06-09 VITALS — BP 140/76 | HR 76 | Ht 64.0 in | Wt 183.5 lb

## 2017-06-09 DIAGNOSIS — K582 Mixed irritable bowel syndrome: Secondary | ICD-10-CM | POA: Diagnosis not present

## 2017-06-09 LAB — TSH: TSH: 0.85 u[IU]/mL (ref 0.35–4.50)

## 2017-06-09 LAB — IGA: IGA: 196 mg/dL (ref 68–378)

## 2017-06-09 NOTE — Patient Instructions (Signed)
Your physician has requested that you go to the basement for lab work before leaving today.  We have given you a lactose free diet.   Try a daily powder fiber such as Benefiber or Citrucel.

## 2017-06-09 NOTE — Progress Notes (Addendum)
06/09/2017 [name] [id number] May 26, 1950   HISTORY OF PRESENT ILLNESS: This is a pleasant 67 year old female who is known to Dr. Hilarie Fredrickson.  She presents to our office today with complaints of stools alternating from loose stool/diarrhea to constipation.  Says that she rarely has a normal stool.  This has been going on for over a year.  She says that often times she has to move her bowels about 30 minutes or so after eating and has a lot of urgency.  Then other times she has stools where they are hard and she is having to strain.  At most she has 2-3 bowel movements a [place].  She denies nocturnal stools, but says that about once or twice a week she wakes up around 5 AM with the urge to have a bowel movement.  She denies any blood in her stool.  She denies abdominal pain.  She has stopped drinking milk because she has realized that it really seems to bother her stomach.  No unintentional weight loss.  She had a colonoscopy in May 2016 with Dr. Hilarie Fredrickson at which time she was found to have some polyps that were removed and moderate diverticulosis in the descending and sigmoid colon.  The polyps were actually hyperplastic, but due to some of them being in the cecum he recommended repeat colonoscopy in 5 years from that time.    Past Medical History:  Diagnosis Date  . Arthritis   . Headache(784.0)   . Hypertension   . Neuropathy   . Urinary incontinence   . Wears contact lenses   . Wears dentures    Past Surgical History:  Procedure Laterality Date  . ABDOMINAL HYSTERECTOMY     oophorectomy  . APPENDECTOMY    . CERVICAL FUSION  2009   and plating  . DILATION AND CURETTAGE OF UTERUS    . EAR CYST EXCISION Right 07/05/2014   Procedure: EXCISION FLEXOR SHEATH CYST WITH RELEASE A-1 PULLEY RIGHT SMALL FINGER;  Surgeon: Leanora Cover, MD;  Location: [facility];  Service: Orthopedics;  Laterality: Right;  . STOMACH SURGERY    . TRIGGER FINGER RELEASE Right 07/05/2014   Procedure: RELEASE RIGHT SMALL FINGER/A-1 PULLEY;  Surgeon: Leanora Cover, MD;  Location: [facility];  Service: Orthopedics;  Laterality: Right;  . TUBOPLASTY / TUBOTUBAL ANASTOMOSIS      reports that she has quit smoking. Her smoking use included cigarettes. She smoked 0.50 packs per [place]. she has never used smokeless tobacco. She reports that she does not drink alcohol or use drugs. family history includes Arthritis in her unknown relative; Breast cancer in her mother and unknown relative; Hypertension in her unknown relative. Allergies  Allergen Reactions  . Lisinopril Anaphylaxis    Tongue swelling  . Penicillins Anaphylaxis and Hives      Outpatient Encounter Medications as of 06/09/2017  Medication Sig  . BEE POLLEN PO Take by mouth.  . cholecalciferol (VITAMIN D) 1000 units tablet Take 1,000 Units by mouth daily.  Marland Kitchen gabapentin (NEURONTIN) 300 MG capsule TAKE 1 CAPSULE BY MOUTH 3  TIMES DAILY  . HYDROcodone-acetaminophen (NORCO) 10-325 MG tablet TAKE 1 TABLET BY MOUTH EVERY 6 HOURS AS NEEDED FOR PAIN  . metoprolol succinate (TOPROL-XL) 100 MG 24 hr tablet Take 1 tablet (100 mg total) by mouth daily. Take with or immediately following a meal.  . Multiple Vitamin (MULTIVITAMIN WITH MINERALS) TABS Take 1 tablet by mouth daily.  . Omega 3 1000 MG CAPS  Take 1 capsule by mouth daily.  Marland Kitchen triamcinolone ointment (KENALOG) 0.1 % Apply 1 application topically 2 (two) times daily.  . vitamin B-12 (CYANOCOBALAMIN) 500 MCG tablet Take 500 mcg by mouth 2 (two) times daily.   Marland Kitchen albuterol (PROVENTIL HFA;VENTOLIN HFA) 108 (90 Base) MCG/ACT inhaler Inhale 1-2 puffs into the lungs every 6 (six) hours as needed for wheezing or shortness of breath. (Patient not taking: Reported on 06/09/2017)  . [DISCONTINUED] cyclobenzaprine (FLEXERIL) 5 MG tablet Take 1 tablet (5 mg total) by mouth at bedtime.  . [DISCONTINUED] predniSONE (DELTASONE) 20 MG tablet Two daily with food   No facility-administered  encounter medications on file as of 06/09/2017.      REVIEW OF SYSTEMS  : All other systems reviewed and negative except where noted in the History of Present Illness.   PHYSICAL EXAM: BP 140/76 (BP Location: Left Arm, Patient Position: Sitting, Cuff Size: Normal)   Pulse 76   Ht 5\' 4"  (1.626 m) Comment: height measured without shoes  Wt 183 lb 8 oz (83.2 kg)   BMI 31.50 kg/m  General: Well developed female in no acute distress Head: Normocephalic and atraumatic Eyes:  Sclerae anicteric, conjunctiva pink. Ears: Normal auditory acuity Lungs: Clear throughout to auscultation; no increased WOB. Heart: Regular rate and rhythm; no M/R/G. Abdomen: Soft, non-distended.  BS present.  Non-tender. Musculoskeletal: Symmetrical with no gross deformities  Skin: No lesions on visible extremities Extremities: No edema  Neurological: Alert oriented x 4, grossly non-focal Psychological:  Alert and cooperative. Normal mood and affect  ASSESSMENT AND PLAN: *67 year old female with alternating loose stools/diarrhea and constipation.  Suspect that some of her loose stools may be secondary to lactose intolerance.  We discussed lactose-free diet and gave her information on this.  Will check TSH and celiac labs.  I have asked her to begin taking a daily powder fiber supplement such as Benefiber or Citrucel.   CC:  Laurey Morale, MD   Addendum: Reviewed and agree with initial management. Pyrtle, Lajuan Lines, MD

## 2017-06-10 LAB — TISSUE TRANSGLUTAMINASE, IGA: (tTG) Ab, IgA: 1 U/mL

## 2017-06-11 DIAGNOSIS — K582 Mixed irritable bowel syndrome: Secondary | ICD-10-CM | POA: Insufficient documentation

## 2017-07-29 ENCOUNTER — Encounter: Payer: Self-pay | Admitting: Family Medicine

## 2017-07-29 ENCOUNTER — Ambulatory Visit (INDEPENDENT_AMBULATORY_CARE_PROVIDER_SITE_OTHER): Payer: 59 | Admitting: Family Medicine

## 2017-07-29 VITALS — BP 144/80 | HR 69 | Temp 98.2°F | Ht 64.0 in | Wt 187.6 lb

## 2017-07-29 DIAGNOSIS — F1190 Opioid use, unspecified, uncomplicated: Secondary | ICD-10-CM

## 2017-07-29 DIAGNOSIS — M5030 Other cervical disc degeneration, unspecified cervical region: Secondary | ICD-10-CM

## 2017-07-29 MED ORDER — HYDROCODONE-ACETAMINOPHEN 10-325 MG PO TABS: ORAL_TABLET | ORAL | 0 refills | Status: DC

## 2017-07-29 MED ORDER — GABAPENTIN 300 MG PO CAPS: 300.0000 mg | ORAL_CAPSULE | Freq: Three times a day (TID) | ORAL | 1 refills | Status: DC

## 2017-07-29 MED ORDER — ALBUTEROL SULFATE HFA 108 (90 BASE) MCG/ACT IN AERS
2.0000 | INHALATION_SPRAY | RESPIRATORY_TRACT | 11 refills | Status: DC | PRN
Start: 2017-07-29 — End: 2018-01-01

## 2017-07-29 NOTE — Progress Notes (Signed)
   Subjective:    Patient ID: Einar Gip Duchesne, female    DOB: [birth year], 67 y.o.   MRN: [id number]  HPI Here for pain management. She is doing well.  Indication for chronic opioid: neck pain  Medication and dose: Norco 10-325  # pills per month: 120 Last UDS date: 05-19-17 Opioid Treatment Agreement signed (Y/N): 05-19-17 Opioid Treatment Agreement last reviewed with patient:  05-19-17  [place] reviewed this encounter (include red flags): 07-29-17      Review of Systems  Constitutional: Negative.   Respiratory: Negative.   Cardiovascular: Negative.   Musculoskeletal: Positive for neck pain and neck stiffness.  Neurological: Negative.        Objective:   Physical Exam  Constitutional: She is oriented to person, place, and time. She appears well-developed and well-nourished.  Cardiovascular: Normal rate, regular rhythm, normal heart sounds and intact distal pulses.  Pulmonary/Chest: Effort normal and breath sounds normal. No respiratory distress. She has no wheezes. She has no rales.  Neurological: She is alert and oriented to person, place, and time.          Assessment & Plan:  Stable pain manegment. Meds were refilled.  Alysia Penna, MD

## 2017-08-04 ENCOUNTER — Other Ambulatory Visit: Payer: Self-pay | Admitting: Family Medicine

## 2017-08-10 ENCOUNTER — Other Ambulatory Visit: Payer: Self-pay | Admitting: Family Medicine

## 2017-08-10 NOTE — Telephone Encounter (Signed)
Last OV 07/29/2017   Last refilled 08/21/2016 disp 30 g with 5 refills   Sent to PCP for approval

## 2017-10-20 ENCOUNTER — Ambulatory Visit (INDEPENDENT_AMBULATORY_CARE_PROVIDER_SITE_OTHER): Payer: 59 | Admitting: Family Medicine

## 2017-10-20 ENCOUNTER — Encounter: Payer: Self-pay | Admitting: Family Medicine

## 2017-10-20 VITALS — BP 120/58 | HR 65 | Temp 98.3°F | Ht 64.0 in | Wt 185.8 lb

## 2017-10-20 DIAGNOSIS — M5030 Other cervical disc degeneration, unspecified cervical region: Secondary | ICD-10-CM

## 2017-10-20 DIAGNOSIS — F1190 Opioid use, unspecified, uncomplicated: Secondary | ICD-10-CM | POA: Diagnosis not present

## 2017-10-20 MED ORDER — HYDROCODONE-ACETAMINOPHEN 10-325 MG PO TABS: 1.0000 | ORAL_TABLET | Freq: Four times a day (QID) | ORAL | 0 refills | Status: DC | PRN

## 2017-10-20 NOTE — Progress Notes (Signed)
   Subjective:    Patient ID: [name], female    DOB: Dec 13, 1950, 67 y.o.   MRN: [id number]  HPI Here for pain management. She is doing well. Indication for chronic opioid: neck pain Medication and dose: Norco 10-325  # pills per month: 120  Last UDS date: 05-19-17 Opioid Treatment Agreement signed (Y/N): 07-29-17 Opioid Treatment Agreement last reviewed with patient:  10-20-17 [place] reviewed this encounter (include red flags):  10-20-17    Review of Systems  Constitutional: Negative.   Respiratory: Negative.   Cardiovascular: Negative.   Musculoskeletal: Positive for neck pain and neck stiffness.  Neurological: Negative.        Objective:   Physical Exam  Constitutional: She is oriented to person, place, and time. She appears well-developed and well-nourished.  Cardiovascular: Normal rate, regular rhythm, normal heart sounds and intact distal pulses.  Pulmonary/Chest: Effort normal and breath sounds normal. No stridor. No respiratory distress. She has no wheezes. She has no rales.  Neurological: She is alert and oriented to person, place, and time.          Assessment & Plan:  Pain management, meds were refilled.  Alysia Penna, MD

## 2017-11-16 ENCOUNTER — Encounter: Payer: Self-pay | Admitting: Family Medicine

## 2017-11-16 ENCOUNTER — Ambulatory Visit (INDEPENDENT_AMBULATORY_CARE_PROVIDER_SITE_OTHER): Payer: 59 | Admitting: Family Medicine

## 2017-11-16 VITALS — BP 140/78 | HR 73 | Temp 98.0°F | Ht 64.25 in | Wt 182.6 lb

## 2017-11-16 DIAGNOSIS — E559 Vitamin D deficiency, unspecified: Secondary | ICD-10-CM

## 2017-11-16 DIAGNOSIS — E538 Deficiency of other specified B group vitamins: Secondary | ICD-10-CM

## 2017-11-16 DIAGNOSIS — Z0000 Encounter for general adult medical examination without abnormal findings: Secondary | ICD-10-CM

## 2017-11-16 DIAGNOSIS — Z23 Encounter for immunization: Secondary | ICD-10-CM | POA: Diagnosis not present

## 2017-11-16 LAB — CBC WITH DIFFERENTIAL/PLATELET
BASOS ABS: 0.1 10*3/uL (ref 0.0–0.1)
Basophils Relative: 0.7 % (ref 0.0–3.0)
EOS ABS: 0.1 10*3/uL (ref 0.0–0.7)
Eosinophils Relative: 1.5 % (ref 0.0–5.0)
HCT: 38.6 % (ref 36.0–46.0)
HEMOGLOBIN: 12.8 g/dL (ref 12.0–15.0)
Lymphocytes Relative: 25.6 % (ref 12.0–46.0)
Lymphs Abs: 2.5 10*3/uL (ref 0.7–4.0)
MCHC: 33.2 g/dL (ref 30.0–36.0)
MCV: 81.4 fl (ref 78.0–100.0)
Monocytes Absolute: 0.7 10*3/uL (ref 0.1–1.0)
Monocytes Relative: 7.4 % (ref 3.0–12.0)
Neutro Abs: 6.4 10*3/uL (ref 1.4–7.7)
Neutrophils Relative %: 64.8 % (ref 43.0–77.0)
PLATELETS: 274 10*3/uL (ref 150.0–400.0)
RBC: 4.74 Mil/uL (ref 3.87–5.11)
RDW: 14.3 % (ref 11.5–15.5)
WBC: 9.9 10*3/uL (ref 4.0–10.5)

## 2017-11-16 LAB — LIPID PANEL
CHOL/HDL RATIO: 4
CHOLESTEROL: 145 mg/dL (ref 0–200)
HDL: 39.8 mg/dL (ref >39.00)
LDL CALC: 90 mg/dL (ref 0–99)
NonHDL: 105.66
TRIGLYCERIDES: 76 mg/dL (ref 0.0–149.0)
VLDL: 15.2 mg/dL (ref 0.0–40.0)

## 2017-11-16 LAB — BASIC METABOLIC PANEL
BUN: 12 mg/dL (ref 6–23)
CALCIUM: 8.8 mg/dL (ref 8.4–10.5)
CO2: 28 mEq/L (ref 19–32)
Chloride: 105 mEq/L (ref 96–112)
Creatinine, Ser: 0.59 mg/dL (ref 0.40–1.20)
GFR: 108.56 mL/min (ref >60.00)
GLUCOSE: 95 mg/dL (ref 70–99)
Potassium: 4.3 mEq/L (ref 3.5–5.1)
Sodium: 139 mEq/L (ref 135–145)

## 2017-11-16 LAB — VITAMIN B12: VITAMIN B 12: 188 pg/mL — AB (ref 211–911)

## 2017-11-16 LAB — POC URINALSYSI DIPSTICK (AUTOMATED)
Bilirubin, UA: NEGATIVE
Glucose, UA: NEGATIVE
Ketones, UA: NEGATIVE
Leukocytes, UA: NEGATIVE
NITRITE UA: NEGATIVE
PH UA: 6 (ref 5.0–8.0)
Protein, UA: NEGATIVE
RBC UA: NEGATIVE
Spec Grav, UA: 1.02 (ref 1.010–1.025)
UROBILINOGEN UA: 0.2 U/dL

## 2017-11-16 LAB — HEPATIC FUNCTION PANEL
ALBUMIN: 3.8 g/dL (ref 3.5–5.2)
ALT: 8 U/L (ref 0–35)
AST: 11 U/L (ref 0–37)
Alkaline Phosphatase: 93 U/L (ref 39–117)
Bilirubin, Direct: 0.1 mg/dL (ref 0.0–0.3)
Total Bilirubin: 0.4 mg/dL (ref 0.2–1.2)
Total Protein: 6.8 g/dL (ref 6.0–8.3)

## 2017-11-16 LAB — VITAMIN D 25 HYDROXY (VIT D DEFICIENCY, FRACTURES): VITD: 14.99 ng/mL — AB (ref 30.00–100.00)

## 2017-11-16 LAB — TSH: TSH: 0.91 u[IU]/mL (ref 0.35–4.50)

## 2017-11-16 NOTE — Progress Notes (Signed)
   Subjective:    Patient ID: [name], female    DOB: June 23, 1950, 67 y.o.   MRN: [id number]  HPI Here for a well exam. She feels well in general.    Review of Systems  Constitutional: Negative.   HENT: Negative.   Eyes: Negative.   Respiratory: Negative.   Cardiovascular: Negative.   Gastrointestinal: Negative.   Genitourinary: Negative for decreased urine volume, difficulty urinating, dyspareunia, dysuria, enuresis, flank pain, frequency, hematuria, pelvic pain and urgency.  Musculoskeletal: Negative.   Skin: Negative.   Neurological: Negative.   Psychiatric/Behavioral: Negative.        Objective:   Physical Exam  Constitutional: She is oriented to person, place, and time. She appears well-developed and well-nourished. No distress.  HENT:  Head: Normocephalic and atraumatic.  Right Ear: External ear normal.  Left Ear: External ear normal.  Nose: Nose normal.  Mouth/Throat: Oropharynx is clear and moist. No oropharyngeal exudate.  Eyes: Pupils are equal, round, and reactive to light. Conjunctivae and EOM are normal. No scleral icterus.  Neck: Normal range of motion. Neck supple. No JVD present. No thyromegaly present.  Cardiovascular: Normal rate, regular rhythm, normal heart sounds and intact distal pulses. Exam reveals no gallop and no friction rub.  No murmur heard. Pulmonary/Chest: Effort normal and breath sounds normal. No respiratory distress. She has no wheezes. She has no rales. She exhibits no tenderness.  Abdominal: Soft. Bowel sounds are normal. She exhibits no distension and no mass. There is no tenderness. There is no rebound and no guarding.  Musculoskeletal: Normal range of motion. She exhibits no edema or tenderness.  Lymphadenopathy:    She has no cervical adenopathy.  Neurological: She is alert and oriented to person, place, and time. She has normal reflexes. She displays normal reflexes. No cranial nerve deficit. She exhibits normal muscle tone.  Coordination normal.  Skin: Skin is warm and dry. No rash noted. No erythema.  Psychiatric: She has a normal mood and affect. Her behavior is normal. Judgment and thought content normal.          Assessment & Plan:  Well exam. We discussed diet and exercise. Get fasting labs. Given a pneumococcal vaccine.  Alysia Penna, MD

## 2017-12-02 ENCOUNTER — Other Ambulatory Visit: Payer: Self-pay | Admitting: Family Medicine

## 2018-01-01 ENCOUNTER — Other Ambulatory Visit: Payer: Self-pay | Admitting: Family Medicine

## 2018-02-01 ENCOUNTER — Ambulatory Visit (INDEPENDENT_AMBULATORY_CARE_PROVIDER_SITE_OTHER): Payer: 59 | Admitting: Family Medicine

## 2018-02-01 ENCOUNTER — Encounter: Payer: Self-pay | Admitting: Family Medicine

## 2018-02-01 VITALS — BP 140/80 | HR 80 | Temp 98.2°F | Wt 181.1 lb

## 2018-02-01 DIAGNOSIS — M5030 Other cervical disc degeneration, unspecified cervical region: Secondary | ICD-10-CM

## 2018-02-01 DIAGNOSIS — M79641 Pain in right hand: Secondary | ICD-10-CM

## 2018-02-01 DIAGNOSIS — F1190 Opioid use, unspecified, uncomplicated: Secondary | ICD-10-CM

## 2018-02-01 MED ORDER — HYDROCODONE-ACETAMINOPHEN 10-325 MG PO TABS: 1.0000 | ORAL_TABLET | Freq: Four times a day (QID) | ORAL | 0 refills | Status: DC | PRN

## 2018-02-01 MED ORDER — DICLOFENAC SODIUM 1 % TD GEL: 2.0000 g | Freq: Four times a day (QID) | TRANSDERMAL | 5 refills | Status: DC

## 2018-02-01 MED ORDER — HYDROCODONE-ACETAMINOPHEN 10-325 MG PO TABS: 1.0000 | ORAL_TABLET | Freq: Four times a day (QID) | ORAL | 0 refills | Status: AC | PRN

## 2018-02-01 NOTE — Progress Notes (Signed)
   Subjective:    Patient ID: [name], female    DOB: [birth year], 67 y.o.   MRN: [id number]  HPI Here for pain management. She is doing well as far as the neck pain goes. However she has been having pain in the right hand for several months, especially in the index finger. No hx of trauma. She tries to avoid taking NSAIDs due to GI upset.  Indication for chronic opioid: neck pain  Medication and dose: Norco 10-325 # pills per month: 120 Last UDS date: 05-19-17 Opioid Treatment Agreement signed (Y/N): 07-29-17 Opioid Treatment Agreement last reviewed with patient:  02-01-18 [facility] reviewed this encounter (include red flags):  02-01-18   Review of Systems  Constitutional: Negative.   Respiratory: Negative.   Cardiovascular: Negative.   Musculoskeletal: Positive for arthralgias and neck pain.  Neurological: Negative.        Objective:   Physical Exam  Constitutional: She is oriented to person, place, and time. She appears well-developed and well-nourished.  Cardiovascular: Normal rate, regular rhythm, normal heart sounds and intact distal pulses.  Pulmonary/Chest: Effort normal and breath sounds normal.  Musculoskeletal:  She is tender over the 2nd MCP on the right hand. Some crepitus is present, ROM is full. No swelling   Neurological: She is alert and oriented to person, place, and time.          Assessment & Plan:  Pain management, meds were refilled. 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  Alysia Penna, MD

## 2018-02-12 ENCOUNTER — Ambulatory Visit (HOSPITAL_COMMUNITY)
Admission: EM | Admit: 2018-02-12 | Discharge: 2018-02-12 | Disposition: A | Discharge status: Home / Self Care | Payer: 59 | Attending: Emergency Medicine | Admitting: Emergency Medicine

## 2018-02-12 ENCOUNTER — Encounter (HOSPITAL_COMMUNITY): Payer: Self-pay | Admitting: *Deleted

## 2018-02-12 DIAGNOSIS — L989 Disorder of the skin and subcutaneous tissue, unspecified: Secondary | ICD-10-CM | POA: Diagnosis not present

## 2018-02-12 NOTE — ED Triage Notes (Signed)
Reports mole to right waistline area; is waiting for PCP to refer to dermatological surgeon per pt.  States mole got caught on her pants yesterday.  C/O discomfort to site.

## 2018-02-12 NOTE — ED Provider Notes (Signed)
[facility]    CSN: [id number] Arrival date & time: 02/12/18  1446     History   Chief Complaint No chief complaint on file.   HPI 76 B Tatro is a 67 y.o. female.   HPI   The patient states that when she was pulling her pants that the mole on her abdomen tore and started bleeding.  States her primary care MD was planning to send her soon to derm for removal.  Has since stopped bleeding.     Past Medical History:  Diagnosis Date  . Arthritis   . Headache(784.0)   . Hypertension   . Neuropathy   . Urinary incontinence   . Wears contact lenses   . Wears dentures     Patient Active Problem List   Diagnosis Date Noted  . Irritable bowel syndrome with both constipation and diarrhea 06/11/2017  . Tobacco abuse 05/03/2012  . HTN (hypertension) 12/09/2011  . INFLAMED SEBORRHEIC KERATOSIS 04/29/2010  . 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 EDEMA 01/01/2009  . CERUMEN IMPACTION 10/19/2008  . HEADACHE 10/19/2008  . URINARY INCONTINENCE 10/19/2008    Past Surgical History:  Procedure Laterality Date  . ABDOMINAL HYSTERECTOMY     oophorectomy  . APPENDECTOMY    . CERVICAL FUSION  2009   and plating  . COLONOSCOPY  09/20/2014   per Dr. Hilarie Fredrickson, diverticulosis and hyperplastic polyps, repeat in 5 yrs   . DILATION AND CURETTAGE OF UTERUS    . EAR CYST EXCISION Right 07/05/2014   Procedure: EXCISION FLEXOR SHEATH CYST WITH RELEASE A-1 PULLEY RIGHT SMALL FINGER;  Surgeon: Leanora Cover, MD;  Location: [facility];  Service: Orthopedics;  Laterality: Right;  . STOMACH SURGERY    . TRIGGER FINGER RELEASE Right 07/05/2014   Procedure: RELEASE RIGHT SMALL FINGER/A-1 PULLEY;  Surgeon: Leanora Cover, MD;  Location: [facility];  Service: Orthopedics;  Laterality: Right;  . TUBOPLASTY / TUBOTUBAL ANASTOMOSIS      OB History   None      Home Medications    Prior to Admission medications   Medication Sig Start Date End  Date Taking? Authorizing Provider  cholecalciferol (VITAMIN D) 1000 units tablet Take 1,000 Units by mouth daily.   Yes [provider]  diclofenac sodium (VOLTAREN) 1 % GEL Apply 2 g topically 4 (four) times daily. 02/01/18  Yes Laurey Morale, MD  gabapentin (NEURONTIN) 300 MG capsule TAKE 1 CAPSULE BY MOUTH 3  TIMES DAILY 12/02/17  Yes Laurey Morale, MD  HYDROcodone-acetaminophen (NORCO) 10-325 MG tablet Take 1 tablet by mouth every 6 (six) hours as needed for moderate pain. 04/03/18 05/03/18 Yes Laurey Morale, MD  metoprolol succinate (TOPROL-XL) 100 MG 24 hr tablet TAKE 1 TABLET BY MOUTH  DAILY WITH OR IMMEDIATLEY  FOLLOWING A MEAL 08/05/17  Yes Laurey Morale, MD  Multiple Vitamin (MULTIVITAMIN WITH MINERALS) TABS Take 1 tablet by mouth daily.   Yes [provider]  triamcinolone ointment (KENALOG) 0.1 % APPLY EXTERNALLY TO THE AFFECTED AREA TWICE DAILY 08/10/17  Yes Laurey Morale, MD  vitamin B-12 (CYANOCOBALAMIN) 500 MCG tablet Take 500 mcg by mouth 2 (two) times daily.    Yes [provider]  albuterol (PROVENTIL HFA;VENTOLIN HFA) 108 (90 Base) MCG/ACT inhaler INHALE 2 PUFFS INTO THE LUNGS EVERY 4 HOURS AS NEEDED FOR WHEEZING OR SHORTNESS OF BREATH 01/03/18   Laurey Morale, MD  albuterol (PROVENTIL HFA;VENTOLIN HFA) 108 (90 Base) MCG/ACT inhaler  INHALE 2 PUFFS INTO THE LUNGS EVERY 4 HOURS AS NEEDED FOR WHEEZING OR SHORTNESS OF BREATH 01/03/18   Laurey Morale, MD    Family History Family History  Problem Relation Age of Onset  . Arthritis Unknown   . Breast cancer Unknown   . Hypertension Unknown   . Breast cancer Mother   . Colon cancer Neg Hx     Social History Social History   Tobacco Use  . Smoking status: Former Smoker    Packs/[place]: 0.50    Types: Cigarettes  . Smokeless tobacco: Never Used  . Tobacco comment: 3 weeks ago  Substance Use Topics  . Alcohol use: No    Alcohol/week: 0.0 standard drinks  . Drug use: Never     Allergies     Lisinopril and Penicillins   Review of Systems Review of Systems  Constitutional: Negative.  Negative for fever.  HENT: Negative.   Eyes: Negative.  Negative for visual disturbance.  Respiratory: Negative.  Negative for cough and shortness of breath.   Cardiovascular: Negative.  Negative for chest pain and leg swelling.  Gastrointestinal: Negative.   Endocrine: Negative.   Genitourinary: Negative.   Musculoskeletal: Negative.  Negative for gait problem and neck stiffness.  Skin: Positive for wound. Negative for rash.  Allergic/Immunologic: Negative.   Neurological: Negative.  Negative for dizziness and headaches.  Hematological: Negative.   Psychiatric/Behavioral: Negative.      Physical Exam Triage Vital Signs ED Triage Vitals  Enc Vitals Group     BP 02/12/18 1508 (!) 157/78     Pulse Rate 02/12/18 1508 74     Resp 02/12/18 1508 16     Temp 02/12/18 1508 97.9 F (36.6 C)     Temp Source 02/12/18 1508 Oral     SpO2 02/12/18 1508 98 %     Weight --      Height --      Head Circumference --      Peak Flow --      Pain Score 02/12/18 1510 4     Pain Loc --      Pain Edu? --      Excl. in [place]? --    No data found.  Updated Vital Signs BP (!) 157/78   Pulse 74   Temp 97.9 F (36.6 C) (Oral)   Resp 16   SpO2 98%   Visual Acuity Right Eye Distance:   Left Eye Distance:   Bilateral Distance:    Right Eye Near:   Left Eye Near:    Bilateral Near:     Physical Exam  Constitutional: She appears well-developed and well-nourished. No distress.  Eyes: Pupils are equal, round, and reactive to light. Conjunctivae are normal. Right eye exhibits no discharge. Left eye exhibits no discharge. No scleral icterus.  Neck: Normal range of motion. Neck supple.  Cardiovascular: Normal rate, regular rhythm, normal heart sounds and intact distal pulses. Exam reveals no gallop and no friction rub.  No murmur heard. Pulmonary/Chest: Effort normal and breath sounds normal. No  stridor. No respiratory distress. She has no wheezes. She has no rales. She exhibits no tenderness.  Skin: Skin is warm and dry. Capillary refill takes less than 2 seconds. Lesion noted. No rash noted. She is not diaphoretic. No erythema. No pallor.     The patients mole is attached and no loose edges or flaps noted.  Surface on medial area is rough, but no active bleeding.  Patient referred to dermatology for removal  and evaluation.   Nursing note and vitals reviewed.  Patient's Right side.   Patient's left side.    UC Treatments / Results  Labs (all labs ordered are listed, but only abnormal results are displayed) Labs Reviewed - No data to display  EKG None  Radiology No results found.  Procedures Procedures (including critical care time)  Medications Ordered in UC Medications - No data to display  Initial Impression / Assessment and Plan / UC Course  I have reviewed the triage vital signs and the nursing notes.  Pertinent labs & imaging results that were available during my care of the patient were reviewed by me and considered in my medical decision making (see chart for details).     No active bleeding and no area where mole is detached.  Lesion is large and patient better served by removal and pathology at dermatologist office.   Final Clinical Impressions(s) / UC Diagnoses   Final diagnoses:  Skin lesion     Discharge Instructions     Follow up with dermatology to have it evaluated and removed for comfort.     ED Prescriptions    None     Controlled Substance Prescriptions [place] Controlled Substance Registry consulted? Not Applicable   The usual and customary discharge instructions and warnings were given.  The patient verbalizes understanding and agrees to plan of care.      Nehemiah Settle, NP 02/12/18 [phone]

## 2018-02-12 NOTE — Discharge Instructions (Addendum)
Follow up with dermatology to have it evaluated and removed for comfort.

## 2018-02-14 ENCOUNTER — Encounter: Payer: Self-pay | Admitting: Family Medicine

## 2018-02-14 DIAGNOSIS — L989 Disorder of the skin and subcutaneous tissue, unspecified: Secondary | ICD-10-CM

## 2018-02-14 NOTE — Telephone Encounter (Signed)
Dr. Sarajane Jews please advise on dermatologist you want to refer her to.  Thanks

## 2018-02-15 NOTE — Telephone Encounter (Signed)
I did the referral 

## 2018-04-05 ENCOUNTER — Other Ambulatory Visit: Payer: Self-pay | Admitting: Family Medicine

## 2018-04-05 DIAGNOSIS — Z1231 Encounter for screening mammogram for malignant neoplasm of breast: Secondary | ICD-10-CM

## 2018-04-06 ENCOUNTER — Ambulatory Visit
Admission: RE | Admit: 2018-04-06 | Discharge: 2018-04-06 | Disposition: A | Discharge status: Home / Self Care | Payer: 59 | Source: Ambulatory Visit

## 2018-04-06 DIAGNOSIS — Z1231 Encounter for screening mammogram for malignant neoplasm of breast: Secondary | ICD-10-CM

## 2018-04-16 ENCOUNTER — Other Ambulatory Visit: Payer: Self-pay | Admitting: Family Medicine

## 2018-06-03 ENCOUNTER — Ambulatory Visit (INDEPENDENT_AMBULATORY_CARE_PROVIDER_SITE_OTHER): Payer: 59 | Admitting: Family Medicine

## 2018-06-03 ENCOUNTER — Encounter: Payer: Self-pay | Admitting: Family Medicine

## 2018-06-03 VITALS — BP 120/70 | HR 87 | Temp 98.2°F | Wt 185.0 lb

## 2018-06-03 DIAGNOSIS — F1190 Opioid use, unspecified, uncomplicated: Secondary | ICD-10-CM | POA: Diagnosis not present

## 2018-06-03 DIAGNOSIS — M5030 Other cervical disc degeneration, unspecified cervical region: Secondary | ICD-10-CM | POA: Diagnosis not present

## 2018-06-03 MED ORDER — HYDROCODONE-ACETAMINOPHEN 10-325 MG PO TABS: 1.0000 | ORAL_TABLET | Freq: Four times a day (QID) | ORAL | 0 refills | Status: DC | PRN

## 2018-06-03 MED ORDER — DOXYCYCLINE HYCLATE 100 MG PO CAPS: 100.0000 mg | ORAL_CAPSULE | Freq: Two times a day (BID) | ORAL | 0 refills | Status: AC

## 2018-06-03 NOTE — Progress Notes (Signed)
   Subjective:    Patient ID: [name], female    DOB: [birth year], 69 y.o.   MRN: [id number]  HPI Here for pain management. She is doing well.  Indication for chronic opioid: neck pain  Medication and dose: Norco 10-325  # pills per month: 120 Last UDS date: 06-03-18 Opioid Treatment Agreement signed (Y/N): 06-03-18 Opioid Treatment Agreement last reviewed with patient:  06-03-18 NCCSRS reviewed this encounter (include red flags):  06-03-18    Review of Systems  Constitutional: Negative.   Respiratory: Negative.   Cardiovascular: Negative.   Musculoskeletal: Positive for back pain, neck pain and neck stiffness.  Neurological: Negative.        Objective:   Physical Exam Constitutional:      Appearance: Normal appearance.  Cardiovascular:     Rate and Rhythm: Normal rate and regular rhythm.     Pulses: Normal pulses.     Heart sounds: Normal heart sounds.  Pulmonary:     Effort: Pulmonary effort is normal.     Breath sounds: Normal breath sounds.  Neurological:     General: No focal deficit present.     Mental Status: She is alert and oriented to person, place, and time.           Assessment & Plan:  Pain management, meds were refilled.  Alysia Penna, MD

## 2018-06-03 NOTE — Addendum Note (Signed)
Addended by: Elmer Picker on: 06/03/2018 08:40 AM   Modules accepted: Orders

## 2018-06-08 LAB — PAIN MGMT, PROFILE 8 W/CONF, U
6 Acetylmorphine: NEGATIVE ng/mL (ref <10)
AMPHETAMINES: NEGATIVE ng/mL (ref <500)
Alcohol Metabolites: NEGATIVE ng/mL (ref <500)
Benzodiazepines: NEGATIVE ng/mL (ref <100)
Buprenorphine, Urine: NEGATIVE ng/mL (ref <5)
CODEINE: NEGATIVE ng/mL (ref <50)
Cocaine Metabolite: NEGATIVE ng/mL (ref <150)
Creatinine: 242 mg/dL
Hydrocodone: NEGATIVE ng/mL (ref <50)
Hydromorphone: NEGATIVE ng/mL (ref <50)
MARIJUANA METABOLITE: NEGATIVE ng/mL (ref <20)
MDMA: NEGATIVE ng/mL (ref <500)
Morphine: NEGATIVE ng/mL (ref <50)
Norhydrocodone: 93 ng/mL — ABNORMAL HIGH (ref <50)
Opiates: POSITIVE ng/mL — AB (ref <100)
Oxidant: NEGATIVE ug/mL (ref <200)
Oxycodone: NEGATIVE ng/mL (ref <100)
pH: 6.48 (ref 4.5–9.0)

## 2018-06-11 ENCOUNTER — Other Ambulatory Visit: Payer: Self-pay | Admitting: Family Medicine

## 2018-07-10 ENCOUNTER — Other Ambulatory Visit: Payer: Self-pay | Admitting: Family Medicine

## 2018-07-27 ENCOUNTER — Encounter: Payer: Self-pay | Admitting: Family Medicine

## 2018-07-27 NOTE — Telephone Encounter (Signed)
Dr. Fry please advise. Thanks  

## 2018-07-27 NOTE — Telephone Encounter (Signed)
No we need to stick with the original date

## 2018-08-16 ENCOUNTER — Encounter: Payer: Self-pay | Admitting: Family Medicine

## 2018-08-26 ENCOUNTER — Encounter: Payer: Self-pay | Admitting: Family Medicine

## 2018-08-26 ENCOUNTER — Ambulatory Visit (INDEPENDENT_AMBULATORY_CARE_PROVIDER_SITE_OTHER): Payer: Medicare Other | Admitting: Family Medicine

## 2018-08-26 ENCOUNTER — Other Ambulatory Visit: Payer: Self-pay

## 2018-08-26 DIAGNOSIS — F1190 Opioid use, unspecified, uncomplicated: Secondary | ICD-10-CM

## 2018-08-26 DIAGNOSIS — M5030 Other cervical disc degeneration, unspecified cervical region: Secondary | ICD-10-CM | POA: Diagnosis not present

## 2018-08-26 MED ORDER — HYDROCODONE-ACETAMINOPHEN 10-325 MG PO TABS: 1.0000 | ORAL_TABLET | Freq: Four times a day (QID) | ORAL | 0 refills | Status: DC | PRN

## 2018-08-26 MED ORDER — METOPROLOL SUCCINATE ER 100 MG PO TB24: ORAL_TABLET | ORAL | 3 refills | Status: DC

## 2018-08-26 NOTE — Progress Notes (Signed)
Subjective:    Patient ID: [name], female    DOB: October 12, 1950, 68 y.o.   MRN: [id number]  HPI Virtual Visit via Video Note  I connected with the patient on 08/26/18 at 10:30 AM EDT by a video enabled telemedicine application and verified that I am speaking with the correct person using two identifiers.  Location patient: home Location provider:work or home office Persons participating in the virtual visit: patient, provider  I discussed the limitations of evaluation and management by telemedicine and the availability of in person appointments. The patient expressed understanding and agreed to proceed.   HPI: Here for pain management. She is doing well.  Indication for chronic opioid: neck pain Medication and dose: Norco 10-325 # pills per month: 120 Last UDS date: 06-03-18 Opioid Treatment Agreement signed (Y/N): 06-03-18 Opioid Treatment Agreement last reviewed with patient:  08-26-18 NCCSRS reviewed this encounter (include red flags):  08-26-18    ROS: See pertinent positives and negatives per HPI.  Past Medical History:  Diagnosis Date  . Arthritis   . Headache(784.0)   . Hypertension   . Neuropathy   . Urinary incontinence   . Wears contact lenses   . Wears dentures     Past Surgical History:  Procedure Laterality Date  . ABDOMINAL HYSTERECTOMY     oophorectomy  . APPENDECTOMY    . CERVICAL FUSION  2009   and plating  . COLONOSCOPY  09/20/2014   per Dr. Hilarie Fredrickson, diverticulosis and hyperplastic polyps, repeat in 5 yrs   . DILATION AND CURETTAGE OF UTERUS    . EAR CYST EXCISION Right 07/05/2014   Procedure: EXCISION FLEXOR SHEATH CYST WITH RELEASE A-1 PULLEY RIGHT SMALL FINGER;  Surgeon: Leanora Cover, MD;  Location: [facility];  Service: Orthopedics;  Laterality: Right;  . STOMACH SURGERY    . TRIGGER FINGER RELEASE Right 07/05/2014   Procedure: RELEASE RIGHT SMALL FINGER/A-1 PULLEY;  Surgeon: Leanora Cover, MD;  Location: [facility];  Service: Orthopedics;  Laterality: Right;  . TUBOPLASTY / TUBOTUBAL ANASTOMOSIS      Family History  Problem Relation Age of Onset  . Arthritis Unknown   . Breast cancer Unknown   . Hypertension Unknown   . Breast cancer Mother   . Colon cancer Neg Hx      Current Outpatient Medications:  .  albuterol (PROVENTIL HFA;VENTOLIN HFA) 108 (90 Base) MCG/ACT inhaler, INHALE 2 PUFFS INTO THE LUNGS EVERY 4 HOURS AS NEEDED FOR WHEEZING OR SHORTNESS OF BREATH, Disp: 18 g, Rfl: 0 .  albuterol (PROVENTIL HFA;VENTOLIN HFA) 108 (90 Base) MCG/ACT inhaler, INHALE 2 PUFFS INTO THE LUNGS EVERY 4 HOURS AS NEEDED FOR WHEEZING OR SHORTNESS OF BREATH, Disp: 18 g, Rfl: 0 .  cholecalciferol (VITAMIN D) 1000 units tablet, Take 1,000 Units by mouth daily., Disp: , Rfl:  .  diclofenac sodium (VOLTAREN) 1 % GEL, Apply 2 g topically 4 (four) times daily., Disp: 100 g, Rfl: 5 .  gabapentin (NEURONTIN) 300 MG capsule, TAKE 1 CAPSULE BY MOUTH 3  TIMES DAILY, Disp: 270 capsule, Rfl: 0 .  HYDROcodone-acetaminophen (NORCO) 10-325 MG tablet, Take 1 tablet by mouth every 6 (six) hours as needed for up to 30 days for moderate pain., Disp: 120 tablet, Rfl: 0 .  metoprolol succinate (TOPROL-XL) 100 MG 24 hr tablet, TAKE 1 TABLET BY MOUTH  DAILY WITH OR IMMEDIATLEY  FOLLOWING A MEAL, Disp: 90 tablet, Rfl: 1 .  Multiple Vitamin (MULTIVITAMIN WITH MINERALS) TABS, Take 1  tablet by mouth daily., Disp: , Rfl:  .  triamcinolone ointment (KENALOG) 0.1 %, APPLY EXTERNALLY TO THE AFFECTED AREA TWICE DAILY, Disp: 30 g, Rfl: 5 .  vitamin B-12 (CYANOCOBALAMIN) 500 MCG tablet, Take 500 mcg by mouth 2 (two) times daily. , Disp: , Rfl:   EXAM:  VITALS per patient if applicable:  GENERAL: alert, oriented, appears well and in no acute distress  HEENT: atraumatic, conjunttiva clear, no obvious abnormalities on inspection of external nose and ears  NECK: normal movements of the head and neck  LUNGS: on inspection no signs of  respiratory distress, breathing rate appears normal, no obvious gross SOB, gasping or wheezing  CV: no obvious cyanosis  MS: moves all visible extremities without noticeable abnormality  PSYCH/NEURO: pleasant and cooperative, no obvious depression or anxiety, speech and thought processing grossly intact  ASSESSMENT AND PLAN: Pain management, meds were refilled.  Alysia Penna, MD  Discussed the following assessment and plan:  No diagnosis found.     I discussed the assessment and treatment plan with the patient. The patient was provided an opportunity to ask questions and all were answered. The patient agreed with the plan and demonstrated an understanding of the instructions.   The patient was advised to call back or seek an in-person evaluation if the symptoms worsen or if the condition fails to improve as anticipated.     Review of Systems     Objective:   Physical Exam        Assessment & Plan:

## 2018-11-25 ENCOUNTER — Telehealth: Payer: Self-pay | Admitting: Family Medicine

## 2018-11-25 ENCOUNTER — Encounter: Payer: Self-pay | Admitting: Family Medicine

## 2018-11-25 NOTE — Telephone Encounter (Signed)
Pt has an appt with dr fry on 11/29/2018 and she needs a refill on gabapentin 300 mg #180 for 90 D[place] supply sent to [facility] [address]. This will be a new rx to [facility] pt no longer uses mail order phar

## 2018-11-28 MED ORDER — GABAPENTIN 300 MG PO CAPS: 300.0000 mg | ORAL_CAPSULE | Freq: Three times a day (TID) | ORAL | 0 refills | Status: DC

## 2018-11-29 ENCOUNTER — Encounter: Payer: Self-pay | Admitting: Family Medicine

## 2018-11-29 ENCOUNTER — Ambulatory Visit (INDEPENDENT_AMBULATORY_CARE_PROVIDER_SITE_OTHER): Payer: Medicare HMO | Admitting: Family Medicine

## 2018-11-29 ENCOUNTER — Other Ambulatory Visit: Payer: Self-pay

## 2018-11-29 DIAGNOSIS — M5030 Other cervical disc degeneration, unspecified cervical region: Secondary | ICD-10-CM

## 2018-11-29 DIAGNOSIS — F1190 Opioid use, unspecified, uncomplicated: Secondary | ICD-10-CM

## 2018-11-29 DIAGNOSIS — R69 Illness, unspecified: Secondary | ICD-10-CM | POA: Diagnosis not present

## 2018-11-29 MED ORDER — HYDROCODONE-ACETAMINOPHEN 10-325 MG PO TABS: 1.0000 | ORAL_TABLET | Freq: Four times a day (QID) | ORAL | 0 refills | Status: DC | PRN

## 2018-11-29 MED ORDER — HYDROCODONE-ACETAMINOPHEN 10-325 MG PO TABS: 1.0000 | ORAL_TABLET | Freq: Four times a day (QID) | ORAL | 0 refills | Status: AC | PRN

## 2018-11-29 NOTE — Progress Notes (Signed)
Subjective:    Patient ID: [name], female    DOB: Jun 19, 1950, 68 y.o.   MRN: [id number]  HPI Virtual Visit via Video Note  I connected with the patient on 11/29/18 at 11:00 AM EDT by a video enabled telemedicine application and verified that I am speaking with the correct person using two identifiers.  Location patient: home Location provider:work or home office Persons participating in the virtual visit: patient, provider  I discussed the limitations of evaluation and management by telemedicine and the availability of in person appointments. The patient expressed understanding and agreed to proceed.   HPI: Here for pain management, she is doing well.  Indication for chronic opioid: neck pain Medication and dose: Norco 10-325 # pills per month: 120 Last UDS date: 06-03-18 Opioid Treatment Agreement signed (Y/N): 06-03-18 Opioid Treatment Agreement last reviewed with patient:  11-29-18 NCCSRS reviewed this encounter (include red flags): Yes    ROS: See pertinent positives and negatives per HPI.  Past Medical History:  Diagnosis Date  . Arthritis   . Headache(784.0)   . Hypertension   . Neuropathy   . Urinary incontinence   . Wears contact lenses   . Wears dentures     Past Surgical History:  Procedure Laterality Date  . ABDOMINAL HYSTERECTOMY     oophorectomy  . APPENDECTOMY    . CERVICAL FUSION  2009   and plating  . COLONOSCOPY  09/20/2014   per Dr. Hilarie Fredrickson, diverticulosis and hyperplastic polyps, repeat in 5 yrs   . DILATION AND CURETTAGE OF UTERUS    . EAR CYST EXCISION Right 07/05/2014   Procedure: EXCISION FLEXOR SHEATH CYST WITH RELEASE A-1 PULLEY RIGHT SMALL FINGER;  Surgeon: Leanora Cover, MD;  Location: [facility];  Service: Orthopedics;  Laterality: Right;  . STOMACH SURGERY    . TRIGGER FINGER RELEASE Right 07/05/2014   Procedure: RELEASE RIGHT SMALL FINGER/A-1 PULLEY;  Surgeon: Leanora Cover, MD;  Location: [facility];   Service: Orthopedics;  Laterality: Right;  . TUBOPLASTY / TUBOTUBAL ANASTOMOSIS      Family History  Problem Relation Age of Onset  . Arthritis Unknown   . Breast cancer Unknown   . Hypertension Unknown   . Breast cancer Mother   . Colon cancer Neg Hx      Current Outpatient Medications:  .  albuterol (PROVENTIL HFA;VENTOLIN HFA) 108 (90 Base) MCG/ACT inhaler, INHALE 2 PUFFS INTO THE LUNGS EVERY 4 HOURS AS NEEDED FOR WHEEZING OR SHORTNESS OF BREATH, Disp: 18 g, Rfl: 0 .  albuterol (PROVENTIL HFA;VENTOLIN HFA) 108 (90 Base) MCG/ACT inhaler, INHALE 2 PUFFS INTO THE LUNGS EVERY 4 HOURS AS NEEDED FOR WHEEZING OR SHORTNESS OF BREATH, Disp: 18 g, Rfl: 0 .  cholecalciferol (VITAMIN D) 1000 units tablet, Take 1,000 Units by mouth daily., Disp: , Rfl:  .  diclofenac sodium (VOLTAREN) 1 % GEL, Apply 2 g topically 4 (four) times daily., Disp: 100 g, Rfl: 5 .  gabapentin (NEURONTIN) 300 MG capsule, Take 1 capsule (300 mg total) by mouth 3 (three) times daily., Disp: 270 capsule, Rfl: 0 .  [START ON 02/01/2019] HYDROcodone-acetaminophen (NORCO) 10-325 MG tablet, Take 1 tablet by mouth every 6 (six) hours as needed for moderate pain., Disp: 120 tablet, Rfl: 0 .  metoprolol succinate (TOPROL-XL) 100 MG 24 hr tablet, TAKE 1 TABLET BY MOUTH  DAILY WITH OR IMMEDIATLEY  FOLLOWING A MEAL, Disp: 90 tablet, Rfl: 3 .  Multiple Vitamin (MULTIVITAMIN WITH MINERALS) TABS, Take 1  tablet by mouth daily., Disp: , Rfl:  .  triamcinolone ointment (KENALOG) 0.1 %, APPLY EXTERNALLY TO THE AFFECTED AREA TWICE DAILY, Disp: 30 g, Rfl: 5 .  vitamin B-12 (CYANOCOBALAMIN) 500 MCG tablet, Take 500 mcg by mouth 2 (two) times daily. , Disp: , Rfl:   EXAM:  VITALS per patient if applicable:  GENERAL: alert, oriented, appears well and in no acute distress  HEENT: atraumatic, conjunttiva clear, no obvious abnormalities on inspection of external nose and ears  NECK: normal movements of the head and neck  LUNGS: on inspection  no signs of respiratory distress, breathing rate appears normal, no obvious gross SOB, gasping or wheezing  CV: no obvious cyanosis  MS: moves all visible extremities without noticeable abnormality  PSYCH/NEURO: pleasant and cooperative, no obvious depression or anxiety, speech and thought processing grossly intact  ASSESSMENT AND PLAN: Pain management, meds were refilled.  Alysia Penna, MD  Discussed the following assessment and plan:  No diagnosis found.     I discussed the assessment and treatment plan with the patient. The patient was provided an opportunity to ask questions and all were answered. The patient agreed with the plan and demonstrated an understanding of the instructions.   The patient was advised to call back or seek an in-person evaluation if the symptoms worsen or if the condition fails to improve as anticipated.     Review of Systems     Objective:   Physical Exam        Assessment & Plan:

## 2018-11-29 NOTE — Telephone Encounter (Signed)
Patient was seen today.

## 2018-12-22 ENCOUNTER — Encounter: Payer: Self-pay | Admitting: Family Medicine

## 2018-12-23 MED ORDER — METOPROLOL SUCCINATE ER 100 MG PO TB24: ORAL_TABLET | ORAL | 3 refills | Status: DC

## 2018-12-23 MED ORDER — DICLOFENAC SODIUM 1 % TD GEL: 2.0000 g | Freq: Four times a day (QID) | TRANSDERMAL | 5 refills | Status: DC

## 2018-12-23 MED ORDER — ALBUTEROL SULFATE HFA 108 (90 BASE) MCG/ACT IN AERS: INHALATION_SPRAY | RESPIRATORY_TRACT | 0 refills | Status: DC

## 2018-12-30 DIAGNOSIS — M545 Low back pain: Secondary | ICD-10-CM | POA: Diagnosis not present

## 2018-12-30 DIAGNOSIS — M9904 Segmental and somatic dysfunction of sacral region: Secondary | ICD-10-CM | POA: Diagnosis not present

## 2018-12-30 DIAGNOSIS — M9903 Segmental and somatic dysfunction of lumbar region: Secondary | ICD-10-CM | POA: Diagnosis not present

## 2018-12-30 DIAGNOSIS — M5431 Sciatica, right side: Secondary | ICD-10-CM | POA: Diagnosis not present

## 2019-01-06 ENCOUNTER — Other Ambulatory Visit: Payer: Self-pay | Admitting: Family Medicine

## 2019-01-06 DIAGNOSIS — M9903 Segmental and somatic dysfunction of lumbar region: Secondary | ICD-10-CM | POA: Diagnosis not present

## 2019-01-06 DIAGNOSIS — M5431 Sciatica, right side: Secondary | ICD-10-CM | POA: Diagnosis not present

## 2019-01-06 DIAGNOSIS — M545 Low back pain: Secondary | ICD-10-CM | POA: Diagnosis not present

## 2019-01-06 DIAGNOSIS — M9904 Segmental and somatic dysfunction of sacral region: Secondary | ICD-10-CM | POA: Diagnosis not present

## 2019-01-20 DIAGNOSIS — M9903 Segmental and somatic dysfunction of lumbar region: Secondary | ICD-10-CM | POA: Diagnosis not present

## 2019-01-20 DIAGNOSIS — M9904 Segmental and somatic dysfunction of sacral region: Secondary | ICD-10-CM | POA: Diagnosis not present

## 2019-01-20 DIAGNOSIS — M545 Low back pain: Secondary | ICD-10-CM | POA: Diagnosis not present

## 2019-01-20 DIAGNOSIS — M5431 Sciatica, right side: Secondary | ICD-10-CM | POA: Diagnosis not present

## 2019-01-23 DIAGNOSIS — M9904 Segmental and somatic dysfunction of sacral region: Secondary | ICD-10-CM | POA: Diagnosis not present

## 2019-01-23 DIAGNOSIS — M545 Low back pain: Secondary | ICD-10-CM | POA: Diagnosis not present

## 2019-01-23 DIAGNOSIS — M5431 Sciatica, right side: Secondary | ICD-10-CM | POA: Diagnosis not present

## 2019-01-23 DIAGNOSIS — M9903 Segmental and somatic dysfunction of lumbar region: Secondary | ICD-10-CM | POA: Diagnosis not present

## 2019-01-24 DIAGNOSIS — M5431 Sciatica, right side: Secondary | ICD-10-CM | POA: Diagnosis not present

## 2019-01-24 DIAGNOSIS — M9903 Segmental and somatic dysfunction of lumbar region: Secondary | ICD-10-CM | POA: Diagnosis not present

## 2019-01-24 DIAGNOSIS — M545 Low back pain: Secondary | ICD-10-CM | POA: Diagnosis not present

## 2019-01-24 DIAGNOSIS — M9904 Segmental and somatic dysfunction of sacral region: Secondary | ICD-10-CM | POA: Diagnosis not present

## 2019-01-25 DIAGNOSIS — M5431 Sciatica, right side: Secondary | ICD-10-CM | POA: Diagnosis not present

## 2019-01-25 DIAGNOSIS — M545 Low back pain: Secondary | ICD-10-CM | POA: Diagnosis not present

## 2019-01-25 DIAGNOSIS — M9904 Segmental and somatic dysfunction of sacral region: Secondary | ICD-10-CM | POA: Diagnosis not present

## 2019-01-25 DIAGNOSIS — M9903 Segmental and somatic dysfunction of lumbar region: Secondary | ICD-10-CM | POA: Diagnosis not present

## 2019-01-30 DIAGNOSIS — M5431 Sciatica, right side: Secondary | ICD-10-CM | POA: Diagnosis not present

## 2019-01-30 DIAGNOSIS — M9903 Segmental and somatic dysfunction of lumbar region: Secondary | ICD-10-CM | POA: Diagnosis not present

## 2019-01-30 DIAGNOSIS — M545 Low back pain: Secondary | ICD-10-CM | POA: Diagnosis not present

## 2019-01-30 DIAGNOSIS — M9904 Segmental and somatic dysfunction of sacral region: Secondary | ICD-10-CM | POA: Diagnosis not present

## 2019-02-01 DIAGNOSIS — M9903 Segmental and somatic dysfunction of lumbar region: Secondary | ICD-10-CM | POA: Diagnosis not present

## 2019-02-01 DIAGNOSIS — M9904 Segmental and somatic dysfunction of sacral region: Secondary | ICD-10-CM | POA: Diagnosis not present

## 2019-02-01 DIAGNOSIS — M5431 Sciatica, right side: Secondary | ICD-10-CM | POA: Diagnosis not present

## 2019-02-01 DIAGNOSIS — M545 Low back pain: Secondary | ICD-10-CM | POA: Diagnosis not present

## 2019-02-03 DIAGNOSIS — M9904 Segmental and somatic dysfunction of sacral region: Secondary | ICD-10-CM | POA: Diagnosis not present

## 2019-02-03 DIAGNOSIS — M545 Low back pain: Secondary | ICD-10-CM | POA: Diagnosis not present

## 2019-02-03 DIAGNOSIS — M9903 Segmental and somatic dysfunction of lumbar region: Secondary | ICD-10-CM | POA: Diagnosis not present

## 2019-02-03 DIAGNOSIS — M5431 Sciatica, right side: Secondary | ICD-10-CM | POA: Diagnosis not present

## 2019-02-06 DIAGNOSIS — M545 Low back pain: Secondary | ICD-10-CM | POA: Diagnosis not present

## 2019-02-06 DIAGNOSIS — M9904 Segmental and somatic dysfunction of sacral region: Secondary | ICD-10-CM | POA: Diagnosis not present

## 2019-02-06 DIAGNOSIS — M5431 Sciatica, right side: Secondary | ICD-10-CM | POA: Diagnosis not present

## 2019-02-06 DIAGNOSIS — M9903 Segmental and somatic dysfunction of lumbar region: Secondary | ICD-10-CM | POA: Diagnosis not present

## 2019-02-08 DIAGNOSIS — M9904 Segmental and somatic dysfunction of sacral region: Secondary | ICD-10-CM | POA: Diagnosis not present

## 2019-02-08 DIAGNOSIS — M9903 Segmental and somatic dysfunction of lumbar region: Secondary | ICD-10-CM | POA: Diagnosis not present

## 2019-02-08 DIAGNOSIS — M5431 Sciatica, right side: Secondary | ICD-10-CM | POA: Diagnosis not present

## 2019-02-08 DIAGNOSIS — M545 Low back pain: Secondary | ICD-10-CM | POA: Diagnosis not present

## 2019-02-10 DIAGNOSIS — M545 Low back pain: Secondary | ICD-10-CM | POA: Diagnosis not present

## 2019-02-10 DIAGNOSIS — M9903 Segmental and somatic dysfunction of lumbar region: Secondary | ICD-10-CM | POA: Diagnosis not present

## 2019-02-10 DIAGNOSIS — M9904 Segmental and somatic dysfunction of sacral region: Secondary | ICD-10-CM | POA: Diagnosis not present

## 2019-02-10 DIAGNOSIS — M5431 Sciatica, right side: Secondary | ICD-10-CM | POA: Diagnosis not present

## 2019-02-13 DIAGNOSIS — M9904 Segmental and somatic dysfunction of sacral region: Secondary | ICD-10-CM | POA: Diagnosis not present

## 2019-02-13 DIAGNOSIS — M545 Low back pain: Secondary | ICD-10-CM | POA: Diagnosis not present

## 2019-02-13 DIAGNOSIS — M9903 Segmental and somatic dysfunction of lumbar region: Secondary | ICD-10-CM | POA: Diagnosis not present

## 2019-02-13 DIAGNOSIS — M5431 Sciatica, right side: Secondary | ICD-10-CM | POA: Diagnosis not present

## 2019-02-15 DIAGNOSIS — M5431 Sciatica, right side: Secondary | ICD-10-CM | POA: Diagnosis not present

## 2019-02-15 DIAGNOSIS — M9903 Segmental and somatic dysfunction of lumbar region: Secondary | ICD-10-CM | POA: Diagnosis not present

## 2019-02-15 DIAGNOSIS — M545 Low back pain: Secondary | ICD-10-CM | POA: Diagnosis not present

## 2019-02-15 DIAGNOSIS — M9904 Segmental and somatic dysfunction of sacral region: Secondary | ICD-10-CM | POA: Diagnosis not present

## 2019-02-16 DIAGNOSIS — M9904 Segmental and somatic dysfunction of sacral region: Secondary | ICD-10-CM | POA: Diagnosis not present

## 2019-02-16 DIAGNOSIS — M545 Low back pain: Secondary | ICD-10-CM | POA: Diagnosis not present

## 2019-02-16 DIAGNOSIS — M9903 Segmental and somatic dysfunction of lumbar region: Secondary | ICD-10-CM | POA: Diagnosis not present

## 2019-02-16 DIAGNOSIS — M5431 Sciatica, right side: Secondary | ICD-10-CM | POA: Diagnosis not present

## 2019-02-22 DIAGNOSIS — M5431 Sciatica, right side: Secondary | ICD-10-CM | POA: Diagnosis not present

## 2019-02-22 DIAGNOSIS — M542 Cervicalgia: Secondary | ICD-10-CM | POA: Diagnosis not present

## 2019-02-22 DIAGNOSIS — M9902 Segmental and somatic dysfunction of thoracic region: Secondary | ICD-10-CM | POA: Diagnosis not present

## 2019-02-22 DIAGNOSIS — M9904 Segmental and somatic dysfunction of sacral region: Secondary | ICD-10-CM | POA: Diagnosis not present

## 2019-02-22 DIAGNOSIS — M9901 Segmental and somatic dysfunction of cervical region: Secondary | ICD-10-CM | POA: Diagnosis not present

## 2019-02-22 DIAGNOSIS — M9903 Segmental and somatic dysfunction of lumbar region: Secondary | ICD-10-CM | POA: Diagnosis not present

## 2019-02-22 DIAGNOSIS — M545 Low back pain: Secondary | ICD-10-CM | POA: Diagnosis not present

## 2019-02-22 DIAGNOSIS — M546 Pain in thoracic spine: Secondary | ICD-10-CM | POA: Diagnosis not present

## 2019-02-24 ENCOUNTER — Other Ambulatory Visit: Payer: Self-pay | Admitting: Family Medicine

## 2019-02-28 ENCOUNTER — Ambulatory Visit: Payer: Medicare HMO | Admitting: Family Medicine

## 2019-03-06 ENCOUNTER — Encounter: Payer: Self-pay | Admitting: Family Medicine

## 2019-03-06 ENCOUNTER — Ambulatory Visit (INDEPENDENT_AMBULATORY_CARE_PROVIDER_SITE_OTHER): Payer: Medicare HMO | Admitting: Family Medicine

## 2019-03-06 ENCOUNTER — Other Ambulatory Visit: Payer: Self-pay

## 2019-03-06 VITALS — BP 140/80 | HR 75 | Temp 98.6°F | Wt 180.2 lb

## 2019-03-06 DIAGNOSIS — M5030 Other cervical disc degeneration, unspecified cervical region: Secondary | ICD-10-CM

## 2019-03-06 DIAGNOSIS — R69 Illness, unspecified: Secondary | ICD-10-CM | POA: Diagnosis not present

## 2019-03-06 DIAGNOSIS — F1190 Opioid use, unspecified, uncomplicated: Secondary | ICD-10-CM | POA: Diagnosis not present

## 2019-03-06 MED ORDER — HYDROCODONE-ACETAMINOPHEN 10-325 MG PO TABS: 1.0000 | ORAL_TABLET | Freq: Four times a day (QID) | ORAL | 0 refills | Status: DC | PRN

## 2019-03-06 MED ORDER — METOPROLOL SUCCINATE ER 100 MG PO TB24: ORAL_TABLET | ORAL | 3 refills | Status: DC

## 2019-03-06 MED ORDER — GABAPENTIN 300 MG PO CAPS: ORAL_CAPSULE | ORAL | 1 refills | Status: DC

## 2019-03-06 NOTE — Progress Notes (Signed)
   Subjective:    Patient ID: [name], female    DOB: January 03, 1951, 68 y.o.   MRN: [id number]  HPI Here for pain management. She is doing well.  Indication for chronic opioid: neck pain Medication and dose: Norco 10-325  # pills per month: 120 Last UDS date: 124-20 Opioid Treatment Agreement signed (Y/N): 06-03-18 Opioid Treatment Agreement last reviewed with patient:  03-06-19 NCCSRS reviewed this encounter (include red flags): Yes    Review of Systems     Objective:   Physical Exam        Assessment & Plan:  Pain management, meds were refilled.  Alysia Penna, MD

## 2019-03-08 ENCOUNTER — Ambulatory Visit: Payer: Medicare HMO

## 2019-04-12 DIAGNOSIS — K56609 Unspecified intestinal obstruction, unspecified as to partial versus complete obstruction: Secondary | ICD-10-CM | POA: Diagnosis not present

## 2019-04-12 DIAGNOSIS — Z408 Encounter for other prophylactic surgery: Secondary | ICD-10-CM | POA: Diagnosis not present

## 2019-04-13 ENCOUNTER — Inpatient Hospital Stay (HOSPITAL_COMMUNITY)
Admission: EM | Admit: 2019-04-13 | Discharge: 2019-04-21 | DRG: 330 | Disposition: A | Discharge status: Home Health | Payer: Medicare HMO | Attending: Surgery | Admitting: Surgery

## 2019-04-13 ENCOUNTER — Emergency Department (HOSPITAL_COMMUNITY): Payer: Medicare HMO

## 2019-04-13 ENCOUNTER — Encounter (HOSPITAL_COMMUNITY): Payer: Self-pay

## 2019-04-13 ENCOUNTER — Other Ambulatory Visit: Payer: Self-pay

## 2019-04-13 DIAGNOSIS — Z880 Allergy status to penicillin: Secondary | ICD-10-CM

## 2019-04-13 DIAGNOSIS — K56609 Unspecified intestinal obstruction, unspecified as to partial versus complete obstruction: Secondary | ICD-10-CM

## 2019-04-13 DIAGNOSIS — Z978 Presence of other specified devices: Secondary | ICD-10-CM | POA: Diagnosis not present

## 2019-04-13 DIAGNOSIS — R188 Other ascites: Secondary | ICD-10-CM | POA: Diagnosis present

## 2019-04-13 DIAGNOSIS — S31109A Unspecified open wound of abdominal wall, unspecified quadrant without penetration into peritoneal cavity, initial encounter: Secondary | ICD-10-CM | POA: Diagnosis not present

## 2019-04-13 DIAGNOSIS — R1110 Vomiting, unspecified: Secondary | ICD-10-CM | POA: Diagnosis not present

## 2019-04-13 DIAGNOSIS — N993 Prolapse of vaginal vault after hysterectomy: Secondary | ICD-10-CM | POA: Diagnosis not present

## 2019-04-13 DIAGNOSIS — Z803 Family history of malignant neoplasm of breast: Secondary | ICD-10-CM | POA: Diagnosis not present

## 2019-04-13 DIAGNOSIS — Z79899 Other long term (current) drug therapy: Secondary | ICD-10-CM | POA: Diagnosis not present

## 2019-04-13 DIAGNOSIS — E669 Obesity, unspecified: Secondary | ICD-10-CM | POA: Diagnosis present

## 2019-04-13 DIAGNOSIS — Z8249 Family history of ischemic heart disease and other diseases of the circulatory system: Secondary | ICD-10-CM | POA: Diagnosis not present

## 2019-04-13 DIAGNOSIS — K5732 Diverticulitis of large intestine without perforation or abscess without bleeding: Principal | ICD-10-CM | POA: Diagnosis present

## 2019-04-13 DIAGNOSIS — Z20828 Contact with and (suspected) exposure to other viral communicable diseases: Secondary | ICD-10-CM | POA: Diagnosis present

## 2019-04-13 DIAGNOSIS — Z01818 Encounter for other preprocedural examination: Secondary | ICD-10-CM

## 2019-04-13 DIAGNOSIS — Z86010 Personal history of colonic polyps: Secondary | ICD-10-CM | POA: Diagnosis not present

## 2019-04-13 DIAGNOSIS — Z03818 Encounter for observation for suspected exposure to other biological agents ruled out: Secondary | ICD-10-CM | POA: Diagnosis not present

## 2019-04-13 DIAGNOSIS — Z8261 Family history of arthritis: Secondary | ICD-10-CM | POA: Diagnosis not present

## 2019-04-13 DIAGNOSIS — M1990 Unspecified osteoarthritis, unspecified site: Secondary | ICD-10-CM | POA: Diagnosis present

## 2019-04-13 DIAGNOSIS — K5669 Other partial intestinal obstruction: Secondary | ICD-10-CM | POA: Diagnosis not present

## 2019-04-13 DIAGNOSIS — I10 Essential (primary) hypertension: Secondary | ICD-10-CM | POA: Diagnosis not present

## 2019-04-13 DIAGNOSIS — Z888 Allergy status to other drugs, medicaments and biological substances status: Secondary | ICD-10-CM

## 2019-04-13 DIAGNOSIS — K567 Ileus, unspecified: Secondary | ICD-10-CM | POA: Diagnosis not present

## 2019-04-13 DIAGNOSIS — Z981 Arthrodesis status: Secondary | ICD-10-CM

## 2019-04-13 DIAGNOSIS — Z90710 Acquired absence of both cervix and uterus: Secondary | ICD-10-CM

## 2019-04-13 DIAGNOSIS — F17210 Nicotine dependence, cigarettes, uncomplicated: Secondary | ICD-10-CM | POA: Diagnosis present

## 2019-04-13 DIAGNOSIS — G629 Polyneuropathy, unspecified: Secondary | ICD-10-CM | POA: Diagnosis not present

## 2019-04-13 DIAGNOSIS — M797 Fibromyalgia: Secondary | ICD-10-CM | POA: Diagnosis present

## 2019-04-13 DIAGNOSIS — Z4682 Encounter for fitting and adjustment of non-vascular catheter: Secondary | ICD-10-CM | POA: Diagnosis not present

## 2019-04-13 DIAGNOSIS — Z6831 Body mass index (BMI) 31.0-31.9, adult: Secondary | ICD-10-CM

## 2019-04-13 DIAGNOSIS — R140 Abdominal distension (gaseous): Secondary | ICD-10-CM | POA: Diagnosis not present

## 2019-04-13 DIAGNOSIS — R1031 Right lower quadrant pain: Secondary | ICD-10-CM | POA: Diagnosis present

## 2019-04-13 DIAGNOSIS — K6389 Other specified diseases of intestine: Secondary | ICD-10-CM | POA: Diagnosis not present

## 2019-04-13 DIAGNOSIS — Z9049 Acquired absence of other specified parts of digestive tract: Secondary | ICD-10-CM

## 2019-04-13 DIAGNOSIS — Z791 Long term (current) use of non-steroidal anti-inflammatories (NSAID): Secondary | ICD-10-CM | POA: Diagnosis not present

## 2019-04-13 DIAGNOSIS — E876 Hypokalemia: Secondary | ICD-10-CM | POA: Diagnosis not present

## 2019-04-13 DIAGNOSIS — K5720 Diverticulitis of large intestine with perforation and abscess without bleeding: Secondary | ICD-10-CM | POA: Diagnosis not present

## 2019-04-13 HISTORY — DX: Unspecified intestinal obstruction, unspecified as to partial versus complete obstruction: K56.609

## 2019-04-13 LAB — CBC
HCT: 41.4 % (ref 36.0–46.0)
Hemoglobin: 13.5 g/dL (ref 12.0–15.0)
MCH: 26.2 pg (ref 26.0–34.0)
MCHC: 32.6 g/dL (ref 30.0–36.0)
MCV: 80.4 fL (ref 80.0–100.0)
Platelets: 443 10*3/uL — ABNORMAL HIGH (ref 150–400)
RBC: 5.15 MIL/uL — ABNORMAL HIGH (ref 3.87–5.11)
RDW: 14.4 % (ref 11.5–15.5)
WBC: 20.8 10*3/uL — ABNORMAL HIGH (ref 4.0–10.5)
nRBC: 0 % (ref 0.0–0.2)

## 2019-04-13 LAB — COMPREHENSIVE METABOLIC PANEL
ALT: 12 U/L (ref 0–44)
AST: 12 U/L — ABNORMAL LOW (ref 15–41)
Albumin: 3.3 g/dL — ABNORMAL LOW (ref 3.5–5.0)
Alkaline Phosphatase: 93 U/L (ref 38–126)
Anion gap: 16 — ABNORMAL HIGH (ref 5–15)
BUN: 14 mg/dL (ref 8–23)
CO2: 22 mmol/L (ref 22–32)
Calcium: 9.2 mg/dL (ref 8.9–10.3)
Chloride: 100 mmol/L (ref 98–111)
Creatinine, Ser: 0.61 mg/dL (ref 0.44–1.00)
GFR calc Af Amer: >60 mL/min (ref >60)
GFR calc non Af Amer: >60 mL/min (ref >60)
Glucose, Bld: 99 mg/dL (ref 70–99)
Potassium: 3 mmol/L — ABNORMAL LOW (ref 3.5–5.1)
Sodium: 138 mmol/L (ref 135–145)
Total Bilirubin: 0.6 mg/dL (ref 0.3–1.2)
Total Protein: 8.2 g/dL — ABNORMAL HIGH (ref 6.5–8.1)

## 2019-04-13 LAB — URINALYSIS, ROUTINE W REFLEX MICROSCOPIC
Bacteria, UA: NONE SEEN
Bilirubin Urine: NEGATIVE
Glucose, UA: NEGATIVE mg/dL
Ketones, ur: 80 mg/dL — AB
Leukocytes,Ua: NEGATIVE
Nitrite: NEGATIVE
Protein, ur: 100 mg/dL — AB
Specific Gravity, Urine: 1.032 — ABNORMAL HIGH (ref 1.005–1.030)
pH: 5 (ref 5.0–8.0)

## 2019-04-13 LAB — LIPASE, BLOOD: Lipase: 16 U/L (ref 11–51)

## 2019-04-13 MED ORDER — DIPHENHYDRAMINE HCL 50 MG/ML IJ SOLN: 12.5000 mg | Freq: Four times a day (QID) | INTRAMUSCULAR | Status: DC | PRN

## 2019-04-13 MED ORDER — IOHEXOL 9 MG/ML PO SOLN
ORAL | Status: AC
  Administered 2019-04-13: 500 mL
  Filled 2019-04-13: qty 500

## 2019-04-13 MED ORDER — SODIUM CHLORIDE 0.9% FLUSH
3.0000 mL | Freq: Once | INTRAVENOUS | Status: AC
  Administered 2019-04-13: 3 mL via INTRAVENOUS

## 2019-04-13 MED ORDER — METRONIDAZOLE IN NACL 5-0.79 MG/ML-% IV SOLN
500.0000 mg | Freq: Three times a day (TID) | INTRAVENOUS | Status: DC
  Administered 2019-04-14 – 2019-04-20 (×20): 500 mg via INTRAVENOUS
  Filled 2019-04-13 (×22): qty 100

## 2019-04-13 MED ORDER — ONDANSETRON 4 MG PO TBDP: 4.0000 mg | ORAL_TABLET | Freq: Four times a day (QID) | ORAL | Status: DC | PRN

## 2019-04-13 MED ORDER — POTASSIUM CHLORIDE 10 MEQ/100ML IV SOLN
10.0000 meq | Freq: Once | INTRAVENOUS | Status: AC
  Administered 2019-04-13: 10 meq via INTRAVENOUS
  Filled 2019-04-13: qty 100

## 2019-04-13 MED ORDER — IOHEXOL 9 MG/ML PO SOLN
500.0000 mL | ORAL | Status: AC
  Administered 2019-04-13: 500 mL via ORAL

## 2019-04-13 MED ORDER — ENOXAPARIN SODIUM 40 MG/0.4ML ~~LOC~~ SOLN: 40.0000 mg | SUBCUTANEOUS | Status: DC

## 2019-04-13 MED ORDER — CIPROFLOXACIN IN D5W 400 MG/200ML IV SOLN
400.0000 mg | Freq: Two times a day (BID) | INTRAVENOUS | Status: DC
  Administered 2019-04-13 – 2019-04-20 (×14): 400 mg via INTRAVENOUS
  Filled 2019-04-13 (×15): qty 200

## 2019-04-13 MED ORDER — ONDANSETRON HCL 4 MG/2ML IJ SOLN
4.0000 mg | Freq: Once | INTRAMUSCULAR | Status: AC
  Administered 2019-04-13: 4 mg via INTRAVENOUS
  Filled 2019-04-13: qty 2

## 2019-04-13 MED ORDER — METOPROLOL TARTRATE 5 MG/5ML IV SOLN
5.0000 mg | Freq: Four times a day (QID) | INTRAVENOUS | Status: DC | PRN
  Administered 2019-04-16: 5 mg via INTRAVENOUS
  Filled 2019-04-13: qty 5

## 2019-04-13 MED ORDER — DIPHENHYDRAMINE HCL 12.5 MG/5ML PO ELIX: 12.5000 mg | ORAL_SOLUTION | Freq: Four times a day (QID) | ORAL | Status: DC | PRN

## 2019-04-13 MED ORDER — IOHEXOL 300 MG/ML  SOLN
100.0000 mL | Freq: Once | INTRAMUSCULAR | Status: AC | PRN
  Administered 2019-04-13: 100 mL via INTRAVENOUS

## 2019-04-13 MED ORDER — ONDANSETRON HCL 4 MG/2ML IJ SOLN
4.0000 mg | Freq: Four times a day (QID) | INTRAMUSCULAR | Status: DC | PRN
  Administered 2019-04-13 – 2019-04-16 (×3): 4 mg via INTRAVENOUS
  Filled 2019-04-13 (×3): qty 2

## 2019-04-13 MED ORDER — SODIUM CHLORIDE (PF) 0.9 % IJ SOLN
INTRAMUSCULAR | Status: AC
  Filled 2019-04-13: qty 50

## 2019-04-13 MED ORDER — KCL IN DEXTROSE-NACL 20-5-0.45 MEQ/L-%-% IV SOLN
INTRAVENOUS | Status: DC
  Administered 2019-04-13 – 2019-04-18 (×10): via INTRAVENOUS
  Filled 2019-04-13 (×13): qty 1000

## 2019-04-13 MED ORDER — MORPHINE SULFATE (PF) 2 MG/ML IV SOLN
1.0000 mg | INTRAVENOUS | Status: DC | PRN
  Administered 2019-04-13 – 2019-04-14 (×5): 4 mg via INTRAVENOUS
  Filled 2019-04-13 (×5): qty 2

## 2019-04-13 MED ORDER — SODIUM CHLORIDE 0.9 % IV BOLUS
1000.0000 mL | Freq: Once | INTRAVENOUS | Status: AC
  Administered 2019-04-13: 1000 mL via INTRAVENOUS

## 2019-04-13 MED ORDER — SODIUM CHLORIDE 0.9 % IV SOLN: INTRAVENOUS | Status: DC

## 2019-04-13 MED ORDER — HYDROMORPHONE HCL 1 MG/ML IJ SOLN
1.0000 mg | Freq: Once | INTRAMUSCULAR | Status: AC
  Administered 2019-04-13: 1 mg via INTRAVENOUS
  Filled 2019-04-13: qty 1

## 2019-04-13 NOTE — ED Triage Notes (Signed)
Pt presents with c/o abdominal pain for the past 3 days. Pt also reports vomiting with the pain and is unable to keep any food down on her stomach.

## 2019-04-13 NOTE — H&P (Addendum)
[name] 06-28-1950  [id number].    Chief Complaint/Reason for Consult: Large bowel obstruction  HPI:   This is a very pleasant 68 year old black female with a history of hypertension who has been having some change in her stools over the last month or so with softer or loose stools but not firm like normal.  She denies any blood in her stool.  Over the last week she has had increasing abdominal pain along with an inability to pass stool.  She states that she last had a colonoscopy 4 years ago with Dr. Elmo Putt.  She was found to have a hyperplastic polyp that was removed.  It was recommended that she have a repeat colonoscopy in 5 years.    She does smoke about a pack of cigarettes a Aikman.  Other than that she does not have a family history of colon cancer.  She presented to the [facility] emergency department today for further evaluation given her distention, pain, and inability to move her bowels.  After evaluation and work-up she has been noted to have a leukocytosis along with a CT scan suggestive of a large bowel obstruction at the level of the sigmoid colon with a small pericolonic fluid collection.  We have been asked to evaluate the patient for further evaluation and recommendations.  ROS: ROS: Please see HPI otherwise all other systems have been reviewed and are negative.  Family History  Problem Relation Age of Onset   Arthritis Other    Breast cancer Other    Hypertension Other    Breast cancer Mother    Colon cancer Neg Hx     Past Medical History:  Diagnosis Date   Arthritis    Headache(784.0)    Hypertension    Neuropathy    Urinary incontinence    Wears contact lenses    Wears dentures     Past Surgical History:  Procedure Laterality Date   ABDOMINAL HYSTERECTOMY     oophorectomy   APPENDECTOMY     CERVICAL FUSION  2009   and plating   COLONOSCOPY  09/20/2014   per Dr. Hilarie Fredrickson, diverticulosis and hyperplastic polyps, repeat in 5 yrs      DILATION AND CURETTAGE OF UTERUS     EAR CYST EXCISION Right 07/05/2014   Procedure: EXCISION FLEXOR SHEATH CYST WITH RELEASE A-1 PULLEY RIGHT SMALL FINGER;  Surgeon: Leanora Cover, MD;  Location: [place];  Service: Orthopedics;  Laterality: Right;   STOMACH SURGERY     TRIGGER FINGER RELEASE Right 07/05/2014   Procedure: RELEASE RIGHT SMALL FINGER/A-1 PULLEY;  Surgeon: Leanora Cover, MD;  Location: [place];  Service: Orthopedics;  Laterality: Right;   TUBOPLASTY / TUBOTUBAL ANASTOMOSIS      Social History:  reports that she has quit smoking. Her smoking use included cigarettes. She smoked 0.50 packs per [place]. She has never used smokeless tobacco. She reports that she does not drink alcohol or use drugs.  Allergies:  Allergies  Allergen Reactions   Lisinopril Anaphylaxis    Tongue swelling   Penicillins Anaphylaxis and Hives    (Not in a hospital admission)    Physical Exam: Blood pressure (!) 164/85, pulse 88, temperature 98.4 F (36.9 C), temperature source Oral, resp. rate 18, height 5\' 4"  (1.626 m), weight 82.6 kg, SpO2 94 %. General: pleasant, WD, WN, but mildly obese black female who is laying in bed in NAD HEENT: head is normocephalic, atraumatic.  Sclera are noninjected.  PERRL.  Ears and nose without any masses or lesions.  Mouth is pink and moist Heart: regular, rate, and rhythm.  Normal s1,s2. No obvious murmurs, gallops, or rubs noted.  Palpable radial and pedal pulses bilaterally Lungs: CTAB, no wheezes, rhonchi, or rales noted.  Respiratory effort nonlabored Abd: soft, somewhat tender across her upper abdomen with some distention in the same area as well.  Hypoactive BS, no masses, hernias, or organomegaly MS: all 4 extremities are symmetrical with no cyanosis, clubbing, or edema. Skin: warm and dry with no masses, lesions, or rashes Psych: A&Ox3 with an appropriate affect.   Results for orders placed or performed during the  hospital encounter of 04/13/19 (from the past 48 hour(s))  Lipase, blood     Status: None   Collection Time: 04/13/19 12:03 PM  Result Value Ref Range   Lipase 16 11 - 51 U/L    Comment: Performed at [facility], [place] [address] [place], [place] [address]  Comprehensive metabolic panel     Status: Abnormal   Collection Time: 04/13/19 12:03 PM  Result Value Ref Range   Sodium 138 135 - 145 mmol/L   Potassium 3.0 (L) 3.5 - 5.1 mmol/L   Chloride 100 98 - 111 mmol/L   CO2 22 22 - 32 mmol/L   Glucose, Bld 99 70 - 99 mg/dL   BUN 14 8 - 23 mg/dL   Creatinine, Ser 0.61 0.44 - 1.00 mg/dL   Calcium 9.2 8.9 - 10.3 mg/dL   Total Protein 8.2 (H) 6.5 - 8.1 g/dL   Albumin 3.3 (L) 3.5 - 5.0 g/dL   AST 12 (L) 15 - 41 U/L   ALT 12 0 - 44 U/L   Alkaline Phosphatase 93 38 - 126 U/L   Total Bilirubin 0.6 0.3 - 1.2 mg/dL   GFR calc non Af Amer >60 >60 mL/min   GFR calc Af Amer >60 >60 mL/min   Anion gap 16 (H) 5 - 15    Comment: Performed at [facility], [place] [address], [place], [place] [address]  CBC     Status: Abnormal   Collection Time: 04/13/19 12:03 PM  Result Value Ref Range   WBC 20.8 (H) 4.0 - 10.5 K/uL    Comment: WHITE COUNT CONFIRMED ON SMEAR   RBC 5.15 (H) 3.87 - 5.11 MIL/uL   Hemoglobin 13.5 12.0 - 15.0 g/dL   HCT 41.4 36.0 - 46.0 %   MCV 80.4 80.0 - 100.0 fL   MCH 26.2 26.0 - 34.0 pg   MCHC 32.6 30.0 - 36.0 g/dL   RDW 14.4 11.5 - 15.5 %   Platelets 443 (H) 150 - 400 K/uL   nRBC 0.0 0.0 - 0.2 %    Comment: Performed at [facility], [place] [address] [place], [place] [address]  Urinalysis, Routine w reflex microscopic     Status: Abnormal   Collection Time: 04/13/19 12:05 PM  Result Value Ref Range   Color, Urine AMBER (A) YELLOW    Comment: BIOCHEMICALS MAY BE AFFECTED BY COLOR   APPearance CLEAR CLEAR   Specific Gravity, Urine 1.032 (H) 1.005 - 1.030   pH 5.0 5.0 - 8.0   Glucose, UA NEGATIVE NEGATIVE mg/dL   Hgb  urine dipstick SMALL (A) NEGATIVE   Bilirubin Urine NEGATIVE NEGATIVE   Ketones, ur 80 (A) NEGATIVE mg/dL   Protein, ur 100 (A) NEGATIVE mg/dL   Nitrite NEGATIVE NEGATIVE   Leukocytes,Ua NEGATIVE NEGATIVE   RBC /  HPF 6-10 0 - 5 RBC/hpf   WBC, UA 0-5 0 - 5 WBC/hpf   Bacteria, UA NONE SEEN NONE SEEN   Squamous Epithelial / LPF 0-5 0 - 5   Mucus PRESENT     Comment: Performed at [facility], [place] [address], [place], [place] [address]   Ct Abdomen Pelvis W Contrast  Result Date: 04/13/2019 CLINICAL DATA:  Abdominal pain for 3 days, vomiting EXAM: CT ABDOMEN AND PELVIS WITH CONTRAST TECHNIQUE: Multidetector CT imaging of the abdomen and pelvis was performed using the standard protocol following bolus administration of intravenous contrast. CONTRAST:  126mL OMNIPAQUE IOHEXOL 300 MG/ML  SOLN COMPARISON:  None. FINDINGS: Lower chest: No acute abnormality. Hepatobiliary: No focal liver abnormality is seen. No gallstones, gallbladder wall thickening, or biliary dilatation. Pancreas: Unremarkable. No pancreatic ductal dilatation or surrounding inflammatory changes. Spleen: Normal in size without focal abnormality. Adrenals/Urinary Tract: Adrenals are unremarkable. Right kidney is unremarkable. There are small left renal cysts. Stomach/Bowel: Stomach is unremarkable. Contrast extends into the distal small bowel. There is wall thickening of the small bowel loop adjacent to the distal sigmoid colon, which is likely reactive. There is distention of the majority of the colon with air and stool beginning at the level of the distal sigmoid. There is annular wall thickening of this segment of distal sigmoid colon with a contiguous eccentric area of low-attenuation measuring approximately 2.3 x 1.9 x 3.2 cm. There is infiltration of surrounding fat. Distal sigmoid diverticulosis is noted. The rectum is decompressed. Vascular/Lymphatic: Aortic atherosclerosis. No enlarged abdominal or pelvic lymph  nodes. Reproductive: Status post hysterectomy. No adnexal masses. Other: No abdominal hernia. Musculoskeletal: Degenerative changes of the lumbar spine, greatest at L5-S1. IMPRESSION: Large-bowel obstruction beginning at the distal sigmoid where there is annular wall thickening and a suspected pericolonic abscess. An obstructing mass is to be excluded. Chronic diverticulitis with stricturing is a less likely differential consideration. Electronically Signed   By: Macy Mis M.D.   On: 04/13/2019 13:49      Assessment/Plan Hypertension  Large bowel obstruction  The patient has been found to have a large bowel obstruction in her sigmoid colon secondary to either a benign or malignant etiology.  The patient last had a colonoscopy 4 years ago with only a hyperplastic polyp that was removed.  She will be started on Cipro/Flagyl secondary to this fluid collection that is noted in the pericolonic area.  I have discussed her case with gastroenterology for possible flexible sigmoidoscopy to look at this area but given the degree of obstruction and proximal colonic distention this is felt to likely not be a great option at this time.    She will very likely just require operative intervention for possible resection and possible anastomosis versus colostomy.  This all has been discussed with the patient as well as her husband via the phone.  All questions were encouraged and answered to the best of my ability with the information we have at this time.  She will be kept n.p.o. given this obstructive nature.  We will make further definitive surgical plans in the morning along with possible urologic consult for stent placement prior to surgery.   FEN - NPO/IVFs VTE - lovenox ID - Cipro/Flagyl  Henreitta Cea, [facility] 04/13/2019, 4:45 PM Please see Amion for pager number during [place] hours 7:00am-4:30pm  Agree with above.  Appears to have a high grade obstruction of the distal  sigmoid colon.  Her acute symptoms began last  Thursday (Thanksgiving).  Her other medical issues are HTN and fibromyalgia.  She takes 1-2 hydrocodone daily for the fibromyalgia.  I spoke to her and her husband Hendricks Milo - he was by phone) about surgery.  She'll need a sigmoid colectomy and end colostomy.  There is a good chance this is cancer.  Further treatment will be dictated by the stage, if cancer. I discussed risks of surgery including bleeding, infection, ureteral injury, and other bowel injury.  Plan sigmoid colectomy/ostomy tomorrow.  I spoke to Dr. Alinda Money who will arrange ureteral stents for the patient.  She is married.  Has 5 children (4 are step children).  She works form home for [employer].  Alphonsa Overall, MD, [facility] Surgery Office phone:  [phone]

## 2019-04-13 NOTE — ED Provider Notes (Signed)
[name] DEPT Provider Note   CSN: [id number] Arrival date & time: 04/13/19  1001     History   Chief Complaint Chief Complaint  Patient presents with  . Abdominal Pain    HPI 66 [name] is a 68 y.o. female.     68 year old female presents with several days of bilateral lower abdominal discomfort.  Pain is been crampy in nature lasting for seconds.  Nothing makes his symptoms better or worse.  Some nausea and vomiting but no fever or chills.  She thought that was constipated and took medication for that but did not help.  Denies any urinary symptoms.  No prior history of same.     Past Medical History:  Diagnosis Date  . Arthritis   . Headache(784.0)   . Hypertension   . Neuropathy   . Urinary incontinence   . Wears contact lenses   . Wears dentures     Patient Active Problem List   Diagnosis Date Noted  . Irritable bowel syndrome with both constipation and diarrhea 06/11/2017  . Tobacco abuse 05/03/2012  . HTN (hypertension) 12/09/2011  . INFLAMED SEBORRHEIC KERATOSIS 04/29/2010  . 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 EDEMA 01/01/2009  . CERUMEN IMPACTION 10/19/2008  . HEADACHE 10/19/2008  . URINARY INCONTINENCE 10/19/2008    Past Surgical History:  Procedure Laterality Date  . ABDOMINAL HYSTERECTOMY     oophorectomy  . APPENDECTOMY    . CERVICAL FUSION  2009   and plating  . COLONOSCOPY  09/20/2014   per Dr. Hilarie Fredrickson, diverticulosis and hyperplastic polyps, repeat in 5 yrs   . DILATION AND CURETTAGE OF UTERUS    . EAR CYST EXCISION Right 07/05/2014   Procedure: EXCISION FLEXOR SHEATH CYST WITH RELEASE A-1 PULLEY RIGHT SMALL FINGER;  Surgeon: Leanora Cover, MD;  Location: [place];  Service: Orthopedics;  Laterality: Right;  . STOMACH SURGERY    . TRIGGER FINGER RELEASE Right 07/05/2014   Procedure: RELEASE RIGHT SMALL FINGER/A-1 PULLEY;  Surgeon: Leanora Cover, MD;  Location: [place];  Service: Orthopedics;  Laterality: Right;  . TUBOPLASTY / TUBOTUBAL ANASTOMOSIS       OB History   No obstetric history on file.      Home Medications    Prior to Admission medications   Medication Sig Start Date End Date Taking? Authorizing Provider  albuterol (PROVENTIL HFA;VENTOLIN HFA) 108 (90 Base) MCG/ACT inhaler INHALE 2 PUFFS INTO THE LUNGS EVERY 4 HOURS AS NEEDED FOR WHEEZING OR SHORTNESS OF BREATH 01/03/18   Laurey Morale, MD  albuterol (VENTOLIN HFA) 108 (90 Base) MCG/ACT inhaler INHALE 2 PUFFS INTO THE LUNGS EVERY 4 HOURS AS NEEDED FOR WHEEZING OR SHORTNESS OF BREATH 01/06/19   Laurey Morale, MD  cholecalciferol (VITAMIN D) 1000 units tablet Take 1,000 Units by mouth daily.    [provider]  diclofenac sodium (VOLTAREN) 1 % GEL Apply 2 g topically 4 (four) times daily. 12/23/18   Laurey Morale, MD  gabapentin (NEURONTIN) 300 MG capsule TAKE 1 CAPSULE(300 MG) BY MOUTH THREE TIMES DAILY 03/06/19   Laurey Morale, MD  HYDROcodone-acetaminophen [facility]) 10-325 MG tablet Take 1 tablet by mouth every 6 (six) hours as needed for moderate pain. 05/06/19 06/05/19  Laurey Morale, MD  metoprolol succinate (TOPROL-XL) 100 MG 24 hr tablet TAKE 1 TABLET BY MOUTH  DAILY WITH OR IMMEDIATLEY  FOLLOWING A MEAL 03/06/19   Laurey Morale, MD  Multiple Vitamin (  MULTIVITAMIN WITH MINERALS) TABS Take 1 tablet by mouth daily.    [provider]  triamcinolone ointment (KENALOG) 0.1 % APPLY EXTERNALLY TO THE AFFECTED AREA TWICE DAILY 08/10/17   Laurey Morale, MD  vitamin Regina-12 (CYANOCOBALAMIN) 500 MCG tablet Take 500 mcg by mouth 2 (two) times daily.     [provider]    Family History Family History  Problem Relation Age of Onset  . Arthritis Other   . Breast cancer Other   . Hypertension Other   . Breast cancer Mother   . Colon cancer Neg Hx     Social History Social History   Tobacco Use  . Smoking status: Former Smoker    Packs/[place]: 0.50     Types: Cigarettes  . Smokeless tobacco: Never Used  . Tobacco comment: 3 weeks ago  Substance Use Topics  . Alcohol use: No    Alcohol/week: 0.0 standard drinks  . Drug use: Never     Allergies   Lisinopril and Penicillins   Review of Systems Review of Systems  All other systems reviewed and are negative.    Physical Exam Updated Vital Signs BP (!) 173/82 (BP Location: Left Arm)   Pulse 96   Temp 98.4 F (36.9 C) (Oral)   Resp 18   Ht 1.626 m (5\' 4" )   Wt 82.6 kg   SpO2 94%   BMI 31.24 kg/m   Physical Exam Vitals signs and nursing note reviewed.  Constitutional:      General: She is not in acute distress.    Appearance: Normal appearance. She is well-developed. She is not toxic-appearing.  HENT:     Head: Normocephalic and atraumatic.  Eyes:     General: Lids are normal.     Conjunctiva/sclera: Conjunctivae normal.     Pupils: Pupils are equal, round, and reactive to light.  Neck:     Musculoskeletal: Normal range of motion and neck supple.     Thyroid: No thyroid mass.     Trachea: No tracheal deviation.  Cardiovascular:     Rate and Rhythm: Normal rate and regular rhythm.     Heart sounds: Normal heart sounds. No murmur. No gallop.   Pulmonary:     Effort: Pulmonary effort is normal. No respiratory distress.     Breath sounds: Normal breath sounds. No stridor. No decreased breath sounds, wheezing, rhonchi or rales.  Abdominal:     General: Bowel sounds are normal. There is no distension.     Palpations: Abdomen is soft.     Tenderness: There is no abdominal tenderness. There is no guarding or rebound.    Musculoskeletal: Normal range of motion.        General: No tenderness.  Skin:    General: Skin is warm and dry.     Findings: No abrasion or rash.  Neurological:     Mental Status: She is alert and oriented to person, place, and time.     GCS: GCS eye subscore is 4. GCS verbal subscore is 5. GCS motor subscore is 6.     Cranial Nerves: No  cranial nerve deficit.     Sensory: No sensory deficit.  Psychiatric:        Speech: Speech normal.        Behavior: Behavior normal.      ED Treatments / Results  Labs (all labs ordered are listed, but only abnormal results are displayed) Labs Reviewed  LIPASE, BLOOD  COMPREHENSIVE METABOLIC PANEL  CBC  URINALYSIS, ROUTINE W REFLEX MICROSCOPIC    EKG None  Radiology No results found.  Procedures Procedures (including critical care time)  Medications Ordered in ED Medications  sodium chloride flush (NS) 0.9 % injection 3 mL (has no administration in time range)  sodium chloride 0.9 % bolus 1,000 mL (has no administration in time range)  0.9 %  sodium chloride infusion (has no administration in time range)  ondansetron (ZOFRAN) injection 4 mg (has no administration in time range)     Initial Impression / Assessment and Plan / ED Course  I have reviewed the triage vital signs and the nursing notes.  Pertinent labs & imaging results that were available during my care of the patient were reviewed by me and considered in my medical decision making (see chart for details).        Patient is urinalysis concerning for dehydration.  She is also mildly hypokalemic with a potassium of 3.0.  She was given IV potassium for this.  Has evidence of leukocytosis on her CBC. Patient has evidence of proximal obstruction.  Discussed with general surgery who request medicine admission. Final Clinical Impressions(s) / ED Diagnoses   Final diagnoses:  None    ED Discharge Orders    None       Lacretia Leigh, MD 04/13/19 1445

## 2019-04-14 ENCOUNTER — Inpatient Hospital Stay (HOSPITAL_COMMUNITY): Payer: Medicare HMO | Admitting: Anesthesiology

## 2019-04-14 ENCOUNTER — Inpatient Hospital Stay (HOSPITAL_COMMUNITY): Payer: Medicare HMO

## 2019-04-14 ENCOUNTER — Encounter (HOSPITAL_COMMUNITY): Admission: EM | Disposition: A | Discharge status: Home Health | Payer: Self-pay | Source: Home / Self Care

## 2019-04-14 ENCOUNTER — Encounter (HOSPITAL_COMMUNITY): Payer: Self-pay | Admitting: Emergency Medicine

## 2019-04-14 HISTORY — PX: CYSTOSCOPY WITH STENT PLACEMENT: SHX5790

## 2019-04-14 HISTORY — PX: COLECTOMY WITH COLOSTOMY CREATION/HARTMANN PROCEDURE: SHX6598

## 2019-04-14 LAB — BASIC METABOLIC PANEL
Anion gap: 11 (ref 5–15)
BUN: 9 mg/dL (ref 8–23)
CO2: 23 mmol/L (ref 22–32)
Calcium: 8.1 mg/dL — ABNORMAL LOW (ref 8.9–10.3)
Chloride: 101 mmol/L (ref 98–111)
Creatinine, Ser: 0.59 mg/dL (ref 0.44–1.00)
GFR calc Af Amer: >60 mL/min (ref >60)
GFR calc non Af Amer: >60 mL/min (ref >60)
Glucose, Bld: 107 mg/dL — ABNORMAL HIGH (ref 70–99)
Potassium: 3.7 mmol/L (ref 3.5–5.1)
Sodium: 135 mmol/L (ref 135–145)

## 2019-04-14 LAB — CBC
HCT: 34.7 % — ABNORMAL LOW (ref 36.0–46.0)
Hemoglobin: 11.4 g/dL — ABNORMAL LOW (ref 12.0–15.0)
MCH: 26.4 pg (ref 26.0–34.0)
MCHC: 32.9 g/dL (ref 30.0–36.0)
MCV: 80.3 fL (ref 80.0–100.0)
Platelets: 232 10*3/uL (ref 150–400)
RBC: 4.32 MIL/uL (ref 3.87–5.11)
RDW: 14.4 % (ref 11.5–15.5)
WBC: 17.4 10*3/uL — ABNORMAL HIGH (ref 4.0–10.5)
nRBC: 0 % (ref 0.0–0.2)

## 2019-04-14 LAB — SURGICAL PCR SCREEN
MRSA, PCR: NEGATIVE
Staphylococcus aureus: NEGATIVE

## 2019-04-14 LAB — SARS CORONAVIRUS 2 (TAT 6-24 HRS): SARS Coronavirus 2: NEGATIVE

## 2019-04-14 LAB — HIV ANTIBODY (ROUTINE TESTING W REFLEX): HIV Screen 4th Generation wRfx: NONREACTIVE

## 2019-04-14 SURGERY — COLECTOMY, WITH COLOSTOMY CREATION
Anesthesia: General | Site: Ureter

## 2019-04-14 MED ORDER — ENOXAPARIN SODIUM 40 MG/0.4ML ~~LOC~~ SOLN
40.0000 mg | SUBCUTANEOUS | Status: DC
  Administered 2019-04-15 – 2019-04-20 (×6): 40 mg via SUBCUTANEOUS
  Filled 2019-04-14 (×6): qty 0.4

## 2019-04-14 MED ORDER — SUGAMMADEX SODIUM 200 MG/2ML IV SOLN
INTRAVENOUS | Status: DC | PRN
  Administered 2019-04-14: 175 mg via INTRAVENOUS

## 2019-04-14 MED ORDER — LACTATED RINGERS IV SOLN
INTRAVENOUS | Status: DC
  Administered 2019-04-14 (×2): via INTRAVENOUS

## 2019-04-14 MED ORDER — ROCURONIUM BROMIDE 10 MG/ML (PF) SYRINGE
PREFILLED_SYRINGE | INTRAVENOUS | Status: AC
  Filled 2019-04-14: qty 10

## 2019-04-14 MED ORDER — MIDAZOLAM HCL 2 MG/2ML IJ SOLN
INTRAMUSCULAR | Status: AC
  Filled 2019-04-14: qty 2

## 2019-04-14 MED ORDER — ONDANSETRON HCL 4 MG/2ML IJ SOLN: 4.0000 mg | Freq: Four times a day (QID) | INTRAMUSCULAR | Status: DC | PRN

## 2019-04-14 MED ORDER — ONDANSETRON HCL 4 MG/2ML IJ SOLN
INTRAMUSCULAR | Status: DC | PRN
  Administered 2019-04-14: 4 mg via INTRAVENOUS

## 2019-04-14 MED ORDER — HYDROMORPHONE HCL 1 MG/ML IJ SOLN
INTRAMUSCULAR | Status: AC
  Filled 2019-04-14: qty 2

## 2019-04-14 MED ORDER — FENTANYL CITRATE (PF) 250 MCG/5ML IJ SOLN
INTRAMUSCULAR | Status: AC
  Filled 2019-04-14: qty 5

## 2019-04-14 MED ORDER — BUPIVACAINE HCL (PF) 0.25 % IJ SOLN
INTRAMUSCULAR | Status: AC
  Filled 2019-04-14: qty 30

## 2019-04-14 MED ORDER — PROPOFOL 10 MG/ML IV BOLUS
INTRAVENOUS | Status: DC | PRN
  Administered 2019-04-14: 150 mg via INTRAVENOUS

## 2019-04-14 MED ORDER — ONDANSETRON HCL 4 MG/2ML IJ SOLN
INTRAMUSCULAR | Status: AC
  Filled 2019-04-14: qty 2

## 2019-04-14 MED ORDER — DIPHENHYDRAMINE HCL 50 MG/ML IJ SOLN: 12.5000 mg | Freq: Four times a day (QID) | INTRAMUSCULAR | Status: DC | PRN

## 2019-04-14 MED ORDER — BUPIVACAINE HCL (PF) 0.25 % IJ SOLN
INTRAMUSCULAR | Status: DC | PRN
  Administered 2019-04-14: 30 mL

## 2019-04-14 MED ORDER — DEXAMETHASONE SODIUM PHOSPHATE 10 MG/ML IJ SOLN
INTRAMUSCULAR | Status: AC
  Filled 2019-04-14: qty 1

## 2019-04-14 MED ORDER — MIDAZOLAM HCL 5 MG/5ML IJ SOLN
INTRAMUSCULAR | Status: DC | PRN
  Administered 2019-04-14: 2 mg via INTRAVENOUS

## 2019-04-14 MED ORDER — CHLORHEXIDINE GLUCONATE CLOTH 2 % EX PADS
6.0000 | MEDICATED_PAD | Freq: Every day | CUTANEOUS | Status: DC
  Administered 2019-04-15 – 2019-04-20 (×6): 6 via TOPICAL

## 2019-04-14 MED ORDER — HYDROMORPHONE HCL 2 MG/ML IJ SOLN
INTRAMUSCULAR | Status: AC
  Filled 2019-04-14: qty 1

## 2019-04-14 MED ORDER — 0.9 % SODIUM CHLORIDE (POUR BTL) OPTIME
TOPICAL | Status: DC | PRN
  Administered 2019-04-14: 2000 mL

## 2019-04-14 MED ORDER — PHENYLEPHRINE 40 MCG/ML (10ML) SYRINGE FOR IV PUSH (FOR BLOOD PRESSURE SUPPORT)
PREFILLED_SYRINGE | INTRAVENOUS | Status: DC | PRN
  Administered 2019-04-14 (×2): 100 ug via INTRAVENOUS

## 2019-04-14 MED ORDER — MEPERIDINE HCL 50 MG/ML IJ SOLN: 6.2500 mg | INTRAMUSCULAR | Status: DC | PRN

## 2019-04-14 MED ORDER — DIPHENHYDRAMINE HCL 12.5 MG/5ML PO ELIX: 12.5000 mg | ORAL_SOLUTION | Freq: Four times a day (QID) | ORAL | Status: DC | PRN

## 2019-04-14 MED ORDER — HYDROMORPHONE HCL 1 MG/ML IJ SOLN
INTRAMUSCULAR | Status: DC | PRN
  Administered 2019-04-14 (×4): 0.5 mg via INTRAVENOUS

## 2019-04-14 MED ORDER — ROCURONIUM BROMIDE 10 MG/ML (PF) SYRINGE
PREFILLED_SYRINGE | INTRAVENOUS | Status: DC | PRN
  Administered 2019-04-14: 60 mg via INTRAVENOUS
  Administered 2019-04-14: 10 mg via INTRAVENOUS
  Administered 2019-04-14: 20 mg via INTRAVENOUS
  Administered 2019-04-14 (×2): 10 mg via INTRAVENOUS

## 2019-04-14 MED ORDER — LACTATED RINGERS IV SOLN: INTRAVENOUS | Status: DC

## 2019-04-14 MED ORDER — ONDANSETRON HCL 4 MG/2ML IJ SOLN: 4.0000 mg | Freq: Once | INTRAMUSCULAR | Status: DC | PRN

## 2019-04-14 MED ORDER — MORPHINE SULFATE 2 MG/ML IV SOLN
INTRAVENOUS | Status: DC
  Administered 2019-04-14: 7.5 mg via INTRAVENOUS
  Administered 2019-04-14: 18:00:00 via INTRAVENOUS
  Administered 2019-04-14: 3 mg via INTRAVENOUS
  Administered 2019-04-15: 1.5 mg via INTRAVENOUS
  Administered 2019-04-15: 4.5 mg via INTRAVENOUS
  Administered 2019-04-15: 0.9 mg via INTRAVENOUS
  Administered 2019-04-15: 0.6 mg via INTRAVENOUS
  Administered 2019-04-15: 3 mg via INTRAVENOUS
  Administered 2019-04-16 (×5): 0.9 mg via INTRAVENOUS
  Administered 2019-04-16: 0 mg via INTRAVENOUS
  Administered 2019-04-17: 1.5 mg via INTRAVENOUS
  Administered 2019-04-17: 04:00:00 via INTRAVENOUS
  Administered 2019-04-17: 0.9 mg via INTRAVENOUS
  Filled 2019-04-14 (×2): qty 25

## 2019-04-14 MED ORDER — LACTATED RINGERS IV SOLN
INTRAVENOUS | Status: DC | PRN
  Administered 2019-04-14 (×2): via INTRAVENOUS

## 2019-04-14 MED ORDER — PROPOFOL 10 MG/ML IV BOLUS
INTRAVENOUS | Status: AC
  Filled 2019-04-14: qty 20

## 2019-04-14 MED ORDER — ACETAMINOPHEN 10 MG/ML IV SOLN
1000.0000 mg | Freq: Four times a day (QID) | INTRAVENOUS | Status: AC
  Administered 2019-04-14 – 2019-04-15 (×4): 1000 mg via INTRAVENOUS
  Filled 2019-04-14 (×4): qty 100

## 2019-04-14 MED ORDER — SODIUM CHLORIDE 0.9% FLUSH: 9.0000 mL | INTRAVENOUS | Status: DC | PRN

## 2019-04-14 MED ORDER — HYDROMORPHONE HCL 1 MG/ML IJ SOLN: 0.2500 mg | INTRAMUSCULAR | Status: DC | PRN

## 2019-04-14 MED ORDER — LIDOCAINE 2% (20 MG/ML) 5 ML SYRINGE
INTRAMUSCULAR | Status: AC
  Filled 2019-04-14: qty 5

## 2019-04-14 MED ORDER — PHENYLEPHRINE HCL-NACL 10-0.9 MG/250ML-% IV SOLN
INTRAVENOUS | Status: DC | PRN
  Administered 2019-04-14: 40 ug/min via INTRAVENOUS

## 2019-04-14 MED ORDER — FENTANYL CITRATE (PF) 100 MCG/2ML IJ SOLN
INTRAMUSCULAR | Status: DC | PRN
  Administered 2019-04-14: 50 ug via INTRAVENOUS
  Administered 2019-04-14: 100 ug via INTRAVENOUS
  Administered 2019-04-14 (×2): 50 ug via INTRAVENOUS

## 2019-04-14 MED ORDER — DEXAMETHASONE SODIUM PHOSPHATE 10 MG/ML IJ SOLN
INTRAMUSCULAR | Status: DC | PRN
  Administered 2019-04-14: 8 mg via INTRAVENOUS

## 2019-04-14 MED ORDER — NALOXONE HCL 0.4 MG/ML IJ SOLN: 0.4000 mg | INTRAMUSCULAR | Status: DC | PRN

## 2019-04-14 SURGICAL SUPPLY — 67 items
ADAPTER GOLDBERG URETERAL (ADAPTER) ×2 IMPLANT
ADPR CATH 15X14FR FL DRN BG (ADAPTER) ×2
APL SWBSTK 6 STRL LF DISP (MISCELLANEOUS)
APPLICATOR COTTON TIP 6 STRL (MISCELLANEOUS) ×4 IMPLANT
APPLICATOR COTTON TIP 6IN STRL (MISCELLANEOUS)
BAG URO CATCHER STRL LF (MISCELLANEOUS) ×4 IMPLANT
BLADE EXTENDED COATED 6.5IN (ELECTRODE) ×4 IMPLANT
BLADE HEX COATED 2.75 (ELECTRODE) ×4 IMPLANT
CATH INTERMIT  6FR 70CM (CATHETERS) ×6 IMPLANT
CLIP VESOCCLUDE LG 6/CT (CLIP) IMPLANT
CLOTH BEACON ORANGE TIMEOUT ST (SAFETY) ×4 IMPLANT
COVER SURGICAL LIGHT HANDLE (MISCELLANEOUS) ×4 IMPLANT
COVER WAND RF STERILE (DRAPES) IMPLANT
DRAPE LAPAROSCOPIC ABDOMINAL (DRAPES) ×4 IMPLANT
DRSG VAC ATS MED SENSATRAC (GAUZE/BANDAGES/DRESSINGS) ×2 IMPLANT
ELECT REM PT RETURN 15FT ADLT (MISCELLANEOUS) ×4 IMPLANT
GAUZE SPONGE 4X4 12PLY STRL (GAUZE/BANDAGES/DRESSINGS) ×4 IMPLANT
GLOVE BIOGEL M STRL SZ7.5 (GLOVE) ×4 IMPLANT
GLOVE BIOGEL PI IND STRL 7.0 (GLOVE) ×2 IMPLANT
GLOVE BIOGEL PI INDICATOR 7.0 (GLOVE) ×2
GLOVE EUDERMIC 7 POWDERFREE (GLOVE) ×8 IMPLANT
GOWN SPEC L4 XLG W/TWL (GOWN DISPOSABLE) ×4 IMPLANT
GOWN STRL REUS W/ TWL XL LVL3 (GOWN DISPOSABLE) ×6 IMPLANT
GOWN STRL REUS W/TWL LRG LVL3 (GOWN DISPOSABLE) ×12 IMPLANT
GOWN STRL REUS W/TWL XL LVL3 (GOWN DISPOSABLE) ×12
GUIDEWIRE STR DUAL SENSOR (WIRE) ×4 IMPLANT
KIT SIGMOIDOSCOPE (SET/KITS/TRAYS/PACK) ×2 IMPLANT
KIT TURNOVER KIT A (KITS) IMPLANT
LEGGING LITHOTOMY PAIR STRL (DRAPES) ×4 IMPLANT
LIGASURE IMPACT 36 18CM CVD LR (INSTRUMENTS) ×2 IMPLANT
MANIFOLD NEPTUNE II (INSTRUMENTS) ×4 IMPLANT
PACK COLON (CUSTOM PROCEDURE TRAY) ×4 IMPLANT
PACK CYSTO (CUSTOM PROCEDURE TRAY) ×4 IMPLANT
PACK GENERAL/GYN (CUSTOM PROCEDURE TRAY) ×4 IMPLANT
PENCIL SMOKE EVACUATOR (MISCELLANEOUS) IMPLANT
SHEARS HARMONIC ACE PLUS 36CM (ENDOMECHANICALS) IMPLANT
STAPLER 90 3.5 STAND SLIM (STAPLE) ×4
STAPLER 90 3.5 STD SLIM (STAPLE) IMPLANT
STAPLER CUT CVD 40MM BLUE (STAPLE) ×2 IMPLANT
STAPLER CUT CVD 40MM GREEN (STAPLE) ×2 IMPLANT
STAPLER CUT RELOAD BLUE (STAPLE) ×2 IMPLANT
STAPLER CUT RELOAD GREEN (STAPLE) ×2 IMPLANT
STAPLER PROXIMATE 75MM BLUE (STAPLE) ×2 IMPLANT
STAPLER VISISTAT 35W (STAPLE) ×4 IMPLANT
SUT NOV 1 T60/GS (SUTURE) ×2 IMPLANT
SUT NOVA NAB DX-16 0-1 5-0 T12 (SUTURE) IMPLANT
SUT NOVA T20/GS 25 (SUTURE) IMPLANT
SUT PDS AB 1 CTX 36 (SUTURE) ×2 IMPLANT
SUT PDS AB 1 TP1 96 (SUTURE) ×4 IMPLANT
SUT PROLENE 2 0 KS (SUTURE) ×2 IMPLANT
SUT PROLENE 2 0 SH DA (SUTURE) ×4 IMPLANT
SUT SILK 2 0 (SUTURE) ×4
SUT SILK 2 0 SH CR/8 (SUTURE) ×4 IMPLANT
SUT SILK 2 0SH CR/8 30 (SUTURE) ×2 IMPLANT
SUT SILK 2-0 18XBRD TIE 12 (SUTURE) ×2 IMPLANT
SUT SILK 2-0 30XBRD TIE 12 (SUTURE) IMPLANT
SUT SILK 3 0 (SUTURE) ×4
SUT SILK 3 0 SH CR/8 (SUTURE) ×4 IMPLANT
SUT SILK 3-0 18XBRD TIE 12 (SUTURE) ×2 IMPLANT
SUT VIC AB 2-0 CT1 27 (SUTURE) ×4
SUT VIC AB 2-0 CT1 TAPERPNT 27 (SUTURE) IMPLANT
SUT VIC AB 3-0 SH 8-18 (SUTURE) ×4 IMPLANT
TRAY FOLEY MTR SLVR 16FR STAT (SET/KITS/TRAYS/PACK) ×4 IMPLANT
TUBING CONNECTING 10 (TUBING) ×3 IMPLANT
TUBING CONNECTING 10' (TUBING) ×1
TUBING UROLOGY SET (TUBING) IMPLANT
YANKAUER SUCT BULB TIP NO VENT (SUCTIONS) ×4 IMPLANT

## 2019-04-14 NOTE — Op Note (Addendum)
04/14/2019  5:23 PM  PATIENT:  [name], 68 y.o., female, MRN: [id number]  PREOP DIAGNOSIS:  COLONIC OBSTRUCTION  POSTOP DIAGNOSIS:   Obstructing distal sigmoid colon mas(approximately 15 cm from anal verge), distal small bowel attached to the mass (gross examination by Dr. Margarito Courser suggests inflammatory disease - final pathology pending)  PROCEDURE:   Procedure(s): SIGMOID COLECTOMY, RESECTION OF SMALL BOWEL, END sigmoid COLOSTOMY, Rigid PROCTOSCOPY - [employer] CYSTOSCOPY WITH BILATERAL STENT PLACEMENT - [facility]  SURGEON:   Alphonsa Overall, M.D.  ASSISTANT:   Clyda Greener, M. D./Jessica Focht, P.A.  ANESTHESIA:   general  Anesthesiologist: Pervis Hocking, DO; Brennan Bailey, MD; Lillia Abed, MD CRNA: Lind Covert, CRNA; Anne Fu, CRNA  General  EBL:  200  ml  BLOOD ADMINISTERED: none  DRAINS: none   LOCAL MEDICATIONS USED:   30 cc 1/4% marcaine  SPECIMEN:   Sigmoid colon (suture proximal)  COUNTS CORRECT:  YES  INDICATIONS FOR PROCEDURE:  [name] is a 68 y.o. (DOB: 1950/07/20)  female whose primary care physician is Laurey Morale, MD and comes for abdominal exploration and resection of obstructing sigmoid colon mass.   The indications and risks of the surgery were explained to the patient.  The risks include, but are not limited to, infection, bleeding, and nerve injury.  PROCEDURE: The patient was taken to room #4 and underwent a general anesthetic.  Dr. Larry Sierras placed bilateral ureteral stents.  The patient was already in lithotomy position.  A timeout was held and the surgical checklist run.  I did a rigid sigmoidoscopy to evaluate the level of the colon obstruction and to see if I could see any mucosal disease.  The rigid scope was advanced to about 15 cm until I could not see the lumen anymore.  This is the site of obstruction.   There was some yellowish fluid that could have been pus, but I could not advance the scope any further.  There was  no obvious mucosal malignancy.  The patient was then prepped for surgery.  Her abdomen was prepped with ChloraPrep and sterilely draped.  Another timeout was held.  I made a midline lower abdominal incision into the abdominal cavity.  She had about 400 cc of clear straw-colored ascites.  Her small bowel was significantly distended, as was her colon.  This made the operation tedious and made exposure difficult.  I did an abdominal exploration.  Both of the liver were unremarkable, her gallbladder was soft and unremarkable, and her stomach had an NG tube in place.  I ran her small bowel.  It was significantly distended and I spent time milking the small bowel contents back to the stomach.  Distally, the small bowel was stuck to the mass in her distal sigmoid colon.  Her colon was markedly distended all the way to the distal sigmoid colon.  The mass in her distal sigmoid colon was about 7 to 8 cm in diameter, but was not stuck to the pelvic sidewall.  I could palpate the ureteral stents in the retroperitoneum.  Since the small bowel was stuck to the sigmoid colon mass, I decided to resect the small bowel attached to the mass.  I went proximally on the small bowel about 5 cm to where the small bowel was attached the sigmoid colon mass and distally about 5 cm to where the small bowel was attached to the mass and divided the small bowel with 2 firings of the  blue load Ethicon Contour stapler.    I then turned my attention to the sigmoid colon mass.  This mass was about 7 cm in diameter.  I first tried to decompress the colon by making an enterotomy in the sigmoid colon and sucking this out with a pool suction.  This helped some.  The enterotomy was in the section of colon to be resected.   I divided the mid sigmoid colon with a green load Ethicon Contour stapler.  I took down the mesentery of the sigmoid colon towards the rectum with the LigaSure.  I also used 2-0 silk sutures to oversew the mesentery.  I was  able to palpate the ureteral stents in the retroperitoneum and avoid these during the dissection.  I tried to go about 5 cm beyond the sigmoid mass to divide the proximal rectum/distal sigmoid colon.  I used another green load of the Ethicon Contour stapler and divided the colon.  The division was bout 2 cm above the pelvic floor.  I placed two 2-0 Prolene sutures on the distal colon stump.   I placed a long suture on the proximal end of the resected sigmoid colon and sent the specimen to pathology.  Dr. Margarito Courser looked at the specimen and thought grossly it is probably inflammatory in nature.   She did not do a frozen section.  I then did a side-to-side stapled anastomosis of the small bowel to reestablish continuity.  I first decompressed the cecum with a pool suction through the distal end of the divided small bowel that was only 15 cm from the ileocecal valve..  Then  I use a 75 mm GIA stapler to created a new anastomosis of small bowel.  And then I crossed the anastomosis with a TA 90 stapler.  The mesentery was closed with interrupted 2-0 Vicryl sutures.  I placed some buttressing 2-0 silk sutures.  This anastomosis was about 15 cm from the cecum and widely patent.  I then mobilized the sigmoid colon so that I could bring out the end colostomy in her left upper abdomen.  I made a incision in the left upper quadrant and took out a circular piece of skin for the ostomy site.  I made an incision in her rectus abdominis fascia and brought the sigmoid colon through this incision.  I irrigated her abdomen with 2 L of saline.  We then changed to clean instruments.  I closed her midline wound with a running #1 Novafil suture.  I placed some interrupted #1 PDS sutures during the closure.  I then placed the VAC wound device in the open abdominal wound and placed it to suction.  I matured the left upper quadrant colostomy with 3-0 vicryl sutures.  And an ostomy device was placed on the wound.  The ureteral  stents were removed and the patient was transferred to the recovery room in good condition.  I have a surgeon as a first assist to retract, expose, and assist on this difficult operation.  Alphonsa Overall, MD, [facility] phone:  [phone]

## 2019-04-14 NOTE — Anesthesia Procedure Notes (Signed)
Procedure Name: Intubation Date/Time: 04/14/2019 12:44 PM Performed by: Anne Fu, CRNA Pre-anesthesia Checklist: Patient identified, Emergency Drugs available, Suction available, Patient being monitored and Timeout performed Patient Re-evaluated:Patient Re-evaluated prior to induction Oxygen Delivery Method: Circle system utilized Preoxygenation: Pre-oxygenation with 100% oxygen Induction Type: IV induction Ventilation: Mask ventilation without difficulty Laryngoscope Size: Mac and 4 Grade View: Grade I Tube type: Oral Tube size: 7.5 mm Number of attempts: 1 Airway Equipment and Method: Stylet Placement Confirmation: ETT inserted through vocal cords under direct vision,  positive ETCO2 and breath sounds checked- equal and bilateral Secured at: 19 cm Tube secured with: Tape Dental Injury: Teeth and Oropharynx as per pre-operative assessment

## 2019-04-14 NOTE — Progress Notes (Addendum)
Patient ID: [name], female   DOB: 1950/07/15, 68 y.o.   MRN: [id number] Patient is no better but no worse today.  She is ready for surgery.  Had no new questions today.  All questions have been answered by Dr. Lucia Gaskins or myself.  Appreciate urology assistance for stent placement.  Plan to proceed today with colectomy, +/- colostomy.  Today's labs have been reviewed.  Cont C/F.  Henreitta Cea 8:43 AM 04/14/2019   I have reviewed the surgical plan with the patient. She is ready.  Alphonsa Overall, MD, [facility] Surgery Office phone:  [phone]

## 2019-04-14 NOTE — Progress Notes (Signed)
Patient ID: [name], female   DOB: [birth year], 68 y.o.   MRN: [id number]  I spoke with Ms. Mayhan this morning and explained Dr. Pollie Friar request for temporary ureteral stents for intraoperative identification of the ureters during her surgery today.  She expressed understanding.  We reviewed the procedure, risks, complications, and answered all questions.  She gave informed consent.  Her procedure will likely be performed by my partner, Dr. Lovena Neighbours, or one of the other urologists who is available in the OR this morning.

## 2019-04-14 NOTE — Op Note (Signed)
  Preoperative diagnosis:  1. Large bowel obstruction   Postoperative diagnosis:  1. pending   Procedure: 1. Cystoscopy 2. Bilateral temporary ureteral stent placement   Surgeon: Jacalyn Lefevre, MD   Anesthesia: General   Complications: None   Intraoperative findings:  1. Normal urethra 2. Grade 3 cystocele 3. Normal mucosa on cystoscoy 4. Bilateral open ended catheters placed over wire  EBL: Minimal   Specimens: None   Indication: Mrs. Vanwagner is a 68 yo woman with a large bowel obstruction.  Dr. Lucia Gaskins requested temporary ureteral stents to help facilitate the dissection of the colon.  After reviewing the management options for treatment, he elected to proceed with the above surgical procedure(s). We have discussed the potential benefits and risks of the procedure, side effects of the proposed treatment, the likelihood of the patient achieving the goals of the procedure, and any potential problems that might occur during the procedure or recuperation. Informed consent has been obtained.   Description of procedure:   The patient was taken to the operating room and general anesthesia was induced.  The patient was placed in the dorsal lithotomy position, prepped and draped in the usual sterile fashion, and preoperative antibiotics were administered. A preoperative time-out was performed.    A 21 French 30 degree cystoscope was gently passed through the patient's urethra into the bladder.  The bladder was subsequently emptied and then filled slowly up performing a 360 degrees cystoscopic evaluation.  This demonstrated orthotopic ureteral orifices, normal bladder mucosa with no evidence of colovesical fistula without mucosal abnormality.   I then advanced a 5 [place] open-ended ureteral catheter into the patient's left ureteral orifice over a sensor wire and advanced the catheter up to the renal pelvis.  Subsequently turned my attention to the patient's right ureteral orifice and placed an  open ended catheter in a similar manner.  The bladder was subsequently emptied and the stents were kept in place removing the scope over the stents.  I then  placed a 16 [place] Foley with a Freight forwarder.  The stents were then attached to the [facility] adapter and then tied to the Foley with a silk tie.    The surgery was then turned over to Dr. Lucia Gaskins for facilitation of the remainder of the case.

## 2019-04-14 NOTE — Plan of Care (Signed)
Plan of care reviewed and discussed with the patient. 

## 2019-04-14 NOTE — Anesthesia Preprocedure Evaluation (Signed)
Anesthesia Evaluation  Patient identified by MRN, date of birth, ID band Patient awake    Reviewed: Allergy & Precautions, NPO status , Patient's Chart, lab work & pertinent test results  Airway Mallampati: I  TM Distance: >3 FB Neck ROM: Full    Dental   Pulmonary Patient abstained from smoking., former smoker,    Pulmonary exam normal        Cardiovascular hypertension, Pt. on medications Normal cardiovascular exam     Neuro/Psych    GI/Hepatic   Endo/Other    Renal/GU      Musculoskeletal   Abdominal   Peds  Hematology   Anesthesia Other Findings   Reproductive/Obstetrics                             Anesthesia Physical Anesthesia Plan  ASA: II  Anesthesia Plan: General   Post-op Pain Management:    Induction:   PONV Risk Score and Plan: 3 and Ondansetron, Midazolam and Dexamethasone  Airway Management Planned: Oral ETT  Additional Equipment:   Intra-op Plan:   Post-operative Plan: Extubation in OR  Informed Consent: I have reviewed the patients History and Physical, chart, labs and discussed the procedure including the risks, benefits and alternatives for the proposed anesthesia with the patient or authorized representative who has indicated his/her understanding and acceptance.       Plan Discussed with: CRNA and Surgeon  Anesthesia Plan Comments:         Anesthesia Quick Evaluation

## 2019-04-14 NOTE — Transfer of Care (Signed)
Immediate Anesthesia Transfer of Care Note  Patient: [name]  Procedure(s) Performed: Procedure(s): SIGMOID COLECTOMY, RESECTION OF SMALL BOWEL,WITH END COLOSTOMY, PROCTOSCOPY RIGID (N/A) CYSTOSCOPY WITH BILATERAL STENT PLACEMENT (Bilateral)  Patient Location: PACU  Anesthesia Type:General  Level of Consciousness:  sedated, patient cooperative and responds to stimulation  Airway & Oxygen Therapy:Patient Spontanous Breathing and Patient connected to face mask oxgen  Post-op Assessment:  Report given to PACU RN and Post -op Vital signs reviewed and stable  Post vital signs:  Reviewed and stable  Last Vitals:  Vitals:   04/14/19 0920 04/14/19 1057  BP: (!) 165/84 (!) 175/81  Pulse: 93 80  Resp: (!) 25 16  Temp: 37.2 C 37.5 C  SpO2: [id number]    Complications: No apparent anesthesia complications

## 2019-04-14 NOTE — Progress Notes (Signed)
Per Dr. Conrad [place], they will give beta blocker in OR if needed.  Per MAR, pt has not received her home dose of metoprolol.  Dr. Conrad [place] notified.

## 2019-04-15 LAB — CEA: CEA: 1.1 ng/mL (ref 0.0–4.7)

## 2019-04-15 NOTE — Anesthesia Postprocedure Evaluation (Signed)
Anesthesia Post Note  Patient: [name]  Procedure(s) Performed: SIGMOID COLECTOMY, RESECTION OF SMALL BOWEL,WITH END COLOSTOMY, PROCTOSCOPY RIGID (N/A ) CYSTOSCOPY WITH BILATERAL STENT PLACEMENT (Bilateral Ureter)     Patient location during evaluation: PACU Anesthesia Type: General Level of consciousness: awake and alert and oriented Pain management: pain level controlled Vital Signs Assessment: post-procedure vital signs reviewed and stable Respiratory status: spontaneous breathing, nonlabored ventilation and respiratory function stable Cardiovascular status: blood pressure returned to baseline Postop Assessment: no apparent nausea or vomiting Anesthetic complications: no    Last Vitals:  Vitals:   04/15/19 1338 04/15/19 1605  BP: (!) 168/88   Pulse: 89   Resp: 16 (!) 22  Temp: 37.3 C   SpO2: 91% 96%    Last Pain:  Vitals:   04/15/19 1605  TempSrc:   PainSc: [place]

## 2019-04-15 NOTE — Progress Notes (Signed)
1 [place] Post-Op   Subjective/Chief Complaint: No overnight issues Comfortable this morning NG in place   Objective: Vital signs in last 24 hours: Temp:  [97.9 F (36.6 C)-99.5 F (37.5 C)] 98.1 F (36.7 C) (12/05 0511) Pulse Rate:  [76-93] 76 (12/05 0511) Resp:  [16-25] 18 (12/05 0511) BP: (139-175)/(71-92) 149/71 (12/05 0511) SpO2:  [3 %-100 %] 93 % (12/05 0511) Weight:  [83.5 kg] 83.5 kg (12/04 1059) Last BM Date: 04/08/19  Intake/Output from previous [place]: 12/04 0701 - 12/05 0700 In: 5828.6 [P.O.:120; I.V.:4908.6; NG/GT:100; IV Piggyback:700] Out: 3300 [Urine:2000; Emesis/NG output:450; Blood:50] Intake/Output this shift: No intake/output data recorded.  Exam: Awake and alert Looks comfortable Abdomen soft, ostomy pink, VAC in place  Lab Results:  Recent Labs    04/13/19 1203 04/14/19 0235  WBC 20.8* 17.4*  HGB 13.5 11.4*  HCT 41.4 34.7*  PLT 443* 232   BMET Recent Labs    04/13/19 1203 04/14/19 0235  NA 138 135  K 3.0* 3.7  CL 100 101  CO2 22 23  GLUCOSE 99 107*  BUN 14 9  CREATININE 0.61 0.59  CALCIUM 9.2 8.1*   PT/INR No results for input(s): LABPROT, INR in the last 72 hours. ABG No results for input(s): PHART, HCO3 in the last 72 hours.  Invalid input(s): PCO2, PO2  Studies/Results: Ct Abdomen Pelvis W Contrast  Result Date: 04/13/2019 CLINICAL DATA:  Abdominal pain for 3 days, vomiting EXAM: CT ABDOMEN AND PELVIS WITH CONTRAST TECHNIQUE: Multidetector CT imaging of the abdomen and pelvis was performed using the standard protocol following bolus administration of intravenous contrast. CONTRAST:  182mL OMNIPAQUE IOHEXOL 300 MG/ML  SOLN COMPARISON:  None. FINDINGS: Lower chest: No acute abnormality. Hepatobiliary: No focal liver abnormality is seen. No gallstones, gallbladder wall thickening, or biliary dilatation. Pancreas: Unremarkable. No pancreatic ductal dilatation or surrounding inflammatory changes. Spleen: Normal in size without focal  abnormality. Adrenals/Urinary Tract: Adrenals are unremarkable. Right kidney is unremarkable. There are small left renal cysts. Stomach/Bowel: Stomach is unremarkable. Contrast extends into the distal small bowel. There is wall thickening of the small bowel loop adjacent to the distal sigmoid colon, which is likely reactive. There is distention of the majority of the colon with air and stool beginning at the level of the distal sigmoid. There is annular wall thickening of this segment of distal sigmoid colon with a contiguous eccentric area of low-attenuation measuring approximately 2.3 x 1.9 x 3.2 cm. There is infiltration of surrounding fat. Distal sigmoid diverticulosis is noted. The rectum is decompressed. Vascular/Lymphatic: Aortic atherosclerosis. No enlarged abdominal or pelvic lymph nodes. Reproductive: Status post hysterectomy. No adnexal masses. Other: No abdominal hernia. Musculoskeletal: Degenerative changes of the lumbar spine, greatest at L5-S1. IMPRESSION: Large-bowel obstruction beginning at the distal sigmoid where there is annular wall thickening and a suspected pericolonic abscess. An obstructing mass is to be excluded. Chronic diverticulitis with stricturing is a less likely differential consideration. Electronically Signed   By: Macy Mis M.D.   On: 04/13/2019 13:49   Dg Chest Port 1 View  Result Date: 04/14/2019 CLINICAL DATA:  Preop for abdominal surgery. Large bowel obstruction. EXAM: PORTABLE CHEST 1 VIEW COMPARISON:  CT of the abdomen and pelvis 04/13/2019 FINDINGS: The heart size is exaggerate by low lung volumes. No edema or effusion is present. Surgical changes are noted in the lower cervical spine. IMPRESSION: 1. Low lung volumes. 2. No acute cardiopulmonary disease. Electronically Signed   By: San Morelle M.D.   On: 04/14/2019 07:34  Anti-infectives: Anti-infectives (From admission, onward)   Start     Dose/Rate Route Frequency Ordered Stop   04/13/19 1645   ciprofloxacin (CIPRO) IVPB 400 mg     400 mg 200 mL/hr over 60 Minutes Intravenous Every 12 hours 04/13/19 1634     04/13/19 1645  metroNIDAZOLE (FLAGYL) IVPB 500 mg     500 mg 100 mL/hr over 60 Minutes Intravenous Every 8 hours 04/13/19 1634        Assessment/Plan: s/p Procedure(s): SIGMOID COLECTOMY, RESECTION OF SMALL BOWEL,WITH END COLOSTOMY, PROCTOSCOPY RIGID (N/A) CYSTOSCOPY WITH BILATERAL STENT PLACEMENT (Bilateral)  Continue NG today Repeat labs in the morning  LOS: 2 days    Coralie Keens 04/15/2019

## 2019-04-16 ENCOUNTER — Encounter (HOSPITAL_COMMUNITY): Payer: Self-pay | Admitting: Surgery

## 2019-04-16 LAB — CBC
HCT: 37.2 % (ref 36.0–46.0)
Hemoglobin: 12 g/dL (ref 12.0–15.0)
MCH: 26.1 pg (ref 26.0–34.0)
MCHC: 32.3 g/dL (ref 30.0–36.0)
MCV: 81 fL (ref 80.0–100.0)
Platelets: 380 10*3/uL (ref 150–400)
RBC: 4.59 MIL/uL (ref 3.87–5.11)
RDW: 14.6 % (ref 11.5–15.5)
WBC: 19.7 10*3/uL — ABNORMAL HIGH (ref 4.0–10.5)
nRBC: 0 % (ref 0.0–0.2)

## 2019-04-16 LAB — BASIC METABOLIC PANEL
Anion gap: 10 (ref 5–15)
BUN: 7 mg/dL — ABNORMAL LOW (ref 8–23)
CO2: 30 mmol/L (ref 22–32)
Calcium: 8 mg/dL — ABNORMAL LOW (ref 8.9–10.3)
Chloride: 97 mmol/L — ABNORMAL LOW (ref 98–111)
Creatinine, Ser: 0.49 mg/dL (ref 0.44–1.00)
GFR calc Af Amer: >60 mL/min (ref >60)
GFR calc non Af Amer: >60 mL/min (ref >60)
Glucose, Bld: 147 mg/dL — ABNORMAL HIGH (ref 70–99)
Potassium: 3.4 mmol/L — ABNORMAL LOW (ref 3.5–5.1)
Sodium: 137 mmol/L (ref 135–145)

## 2019-04-16 NOTE — Progress Notes (Signed)
2 Days Post-Op   Subjective/Chief Complaint: Comfortable 2L out of NG but took a lot of ice   Objective: Vital signs in last 24 hours: Temp:  [98.1 F (36.7 C)-99.1 F (37.3 C)] 98.1 F (36.7 C) (12/06 0641) Pulse Rate:  [78-99] 99 (12/06 0641) Resp:  [16-25] 25 (12/06 0641) BP: (167-178)/(76-88) 173/81 (12/06 0641) SpO2:  [88 %-96 %] 95 % (12/06 0641) Last BM Date: 04/08/19  Intake/Output from previous [place]: 12/05 0701 - 12/06 0700 In: 2253.8 [I.V.:1454.1; NG/GT:30; IV Piggyback:769.6] Out: 3600 [Urine:1550; Emesis/NG output:2050] Intake/Output this shift: No intake/output data recorded.  Exam: Abdomen soft, VAC in place, non-distended Ostomy pink, nothing in the bag  Lab Results:  Recent Labs    04/14/19 0235 04/16/19 0313  WBC 17.4* 19.7*  HGB 11.4* 12.0  HCT 34.7* 37.2  PLT 232 380   BMET Recent Labs    04/14/19 0235 04/16/19 0313  NA 135 137  K 3.7 3.4*  CL 101 97*  CO2 23 30  GLUCOSE 107* 147*  BUN 9 7*  CREATININE 0.59 0.49  CALCIUM 8.1* 8.0*   PT/INR No results for input(s): LABPROT, INR in the last 72 hours. ABG No results for input(s): PHART, HCO3 in the last 72 hours.  Invalid input(s): PCO2, PO2  Studies/Results: No results found.  Anti-infectives: Anti-infectives (From admission, onward)   Start     Dose/Rate Route Frequency Ordered Stop   04/13/19 1645  ciprofloxacin (CIPRO) IVPB 400 mg     400 mg 200 mL/hr over 60 Minutes Intravenous Every 12 hours 04/13/19 1634     04/13/19 1645  metroNIDAZOLE (FLAGYL) IVPB 500 mg     500 mg 100 mL/hr over 60 Minutes Intravenous Every 8 hours 04/13/19 1634        Assessment/Plan: s/p Procedure(s): SIGMOID COLECTOMY, RESECTION OF SMALL BOWEL,WITH END COLOSTOMY, PROCTOSCOPY RIGID (N/A) CYSTOSCOPY WITH BILATERAL STENT PLACEMENT (Bilateral)  Continue NG and NPO Repeat CBC in the morning  LOS: 3 days    Coralie Keens 04/16/2019

## 2019-04-16 NOTE — Progress Notes (Signed)
MEWS score yellow due to increased respirations. This is not an acute change. MEWS protocol initiated. Will continue to monitor. Donne Hazel, RN

## 2019-04-17 ENCOUNTER — Inpatient Hospital Stay (HOSPITAL_COMMUNITY): Payer: Medicare HMO

## 2019-04-17 LAB — CBC
HCT: 37.2 % (ref 36.0–46.0)
Hemoglobin: 11.7 g/dL — ABNORMAL LOW (ref 12.0–15.0)
MCH: 25.5 pg — ABNORMAL LOW (ref 26.0–34.0)
MCHC: 31.5 g/dL (ref 30.0–36.0)
MCV: 81 fL (ref 80.0–100.0)
Platelets: 471 10*3/uL — ABNORMAL HIGH (ref 150–400)
RBC: 4.59 MIL/uL (ref 3.87–5.11)
RDW: 14.3 % (ref 11.5–15.5)
WBC: 16.5 10*3/uL — ABNORMAL HIGH (ref 4.0–10.5)
nRBC: 0 % (ref 0.0–0.2)

## 2019-04-17 LAB — BASIC METABOLIC PANEL
Anion gap: 12 (ref 5–15)
BUN: 8 mg/dL (ref 8–23)
CO2: 34 mmol/L — ABNORMAL HIGH (ref 22–32)
Calcium: 8.6 mg/dL — ABNORMAL LOW (ref 8.9–10.3)
Chloride: 91 mmol/L — ABNORMAL LOW (ref 98–111)
Creatinine, Ser: 0.5 mg/dL (ref 0.44–1.00)
GFR calc Af Amer: >60 mL/min (ref >60)
GFR calc non Af Amer: >60 mL/min (ref >60)
Glucose, Bld: 141 mg/dL — ABNORMAL HIGH (ref 70–99)
Potassium: 3.2 mmol/L — ABNORMAL LOW (ref 3.5–5.1)
Sodium: 137 mmol/L (ref 135–145)

## 2019-04-17 MED ORDER — PANTOPRAZOLE SODIUM 40 MG IV SOLR
40.0000 mg | Freq: Two times a day (BID) | INTRAVENOUS | Status: DC
  Administered 2019-04-17 – 2019-04-18 (×4): 40 mg via INTRAVENOUS
  Filled 2019-04-17 (×4): qty 40

## 2019-04-17 MED ORDER — KETOROLAC TROMETHAMINE 15 MG/ML IJ SOLN
15.0000 mg | Freq: Three times a day (TID) | INTRAMUSCULAR | Status: AC
  Administered 2019-04-17 – 2019-04-18 (×6): 15 mg via INTRAVENOUS
  Filled 2019-04-17 (×6): qty 1

## 2019-04-17 MED ORDER — MORPHINE SULFATE (PF) 2 MG/ML IV SOLN
2.0000 mg | INTRAVENOUS | Status: DC | PRN
  Administered 2019-04-18 (×2): 2 mg via INTRAVENOUS
  Administered 2019-04-19: 4 mg via INTRAVENOUS
  Administered 2019-04-19 (×2): 2 mg via INTRAVENOUS
  Filled 2019-04-17: qty 1
  Filled 2019-04-17: qty 2
  Filled 2019-04-17 (×3): qty 1

## 2019-04-17 NOTE — Progress Notes (Signed)
Morphine PCA :wasted 74mls with Guadlupe Spanish, RN. Donne Hazel, RN

## 2019-04-17 NOTE — Consult Note (Signed)
[place] Nurse ostomy consult note Stoma type/location: LUQ colostomy Stomal assessment/size: 1 and 3/4 inch slightly oval Peristomal assessment: clear Treatment options for stomal/peristomal skin: skin barrier ring Output: small amount of serosanguinous  Ostomy pouching: 2pc. 2 and 1/4 inch used today.  Skin barrier ring used. Education provided: (Limited due to patient lying on back for first NPWT dressing change) Explained role of ostomy nurse and creation of stoma  Explained stoma characteristics (budded, flush, color, texture, care)  Enrolled patient in [place] program: Yes, today  [place] Nurse Consult Note: Reason for Consult: Midline wound with NPWT.  First dressing change today Wound type:Surgical Pressure Injury POA:NA Measurement: 14 x 4 x 4 Wound bed:100% red, moist  Drainage (amount, consistency, odor) small serous Periwound:intact Dressing procedure/placement/frequency: One piece of black foam removed from wound, wound cleansed and patted dry.  Three areas of periwound skin protected with skin barrier ring.  One piece of black foam replaced.  Secured with drape and attached to 130mmHg continuous negative pressure.  An immediate seal is achieved. Patient tolerated procedure well.  Next dressing change due on Wednesday.  Supplies in room. Continued ostomy teaching will take place at that time.  Patient will require a HHRN for NPWT and continued ostomy teaching.  If you agree, please order.  [place] nursing team will follow, and will remain available to this patient, the nursing and medical teams.   Thanks, Maudie Flakes, MSN, RN, [place], Arther Abbott  Pager# [phone]

## 2019-04-17 NOTE — Progress Notes (Signed)
32mls of morphine PCA was wasted in the stericycle, witnessed by Pete Pelt, RN.

## 2019-04-17 NOTE — Care Management Important Message (Signed)
Important Message  Patient Details IM Letter given to [place] SW to present to the Patient Name: [name] MRN: [id number] Date of Birth: 18-Oct-1950   Medicare Important Message Given:  Yes     Kerin Salen 04/17/2019, 10:37 AM

## 2019-04-17 NOTE — Progress Notes (Signed)
NG advanced two inches. Donne Hazel, RN

## 2019-04-17 NOTE — Progress Notes (Signed)
[facility]/Trauma Progress Note  3 Days Post-Op   Assessment/Plan HTN - holding home meds  Large bowel obstruction - S/P Cystoscopy, Bilateral temporary ureteral stent placement, Dr. Claudia Desanctis, 12/04 - stents removed after OR, remove foley today - S/P sigmoid colectomy, SBR, end colostomy, rigid proctoscopy, Dr. Lucia Gaskins, 12/04 - some flatus in bag, no stool - NGT output high Leukocytosis - 19.7 yesterday, labs today pending, monitor, continue IV abx  FEN - NPO/IVFs, NGT VTE - lovenox ID - Cipro/Flagyl Foley: DC'd 12/07 Follow up: Dr. Lucia Gaskins  DISPO: Continue NGT, await return of bowel function. DC PCA and foley. Ambulate. Path pending.     LOS: 4 days    Subjective: CC: abdominal pain  Pain well controlled. She states minimal pain. She is not using the PCA much. She has had minimal ice chips overnight. No issues overnight. No fever, chills or nausea. Pt has not been out of bed. She has been using her IS. She denies calf pain.   Objective: Vital signs in last 24 hours: Temp:  [98.5 F (36.9 C)-99.4 F (37.4 C)] 98.7 F (37.1 C) (12/07 0417) Pulse Rate:  [72-95] 82 (12/07 0417) Resp:  [17-30] 21 (12/07 0417) BP: (170-191)/(80-88) 170/82 (12/07 0417) SpO2:  [93 %-98 %] 98 % (12/07 0417) Last BM Date: 04/08/19  Intake/Output from previous [place]: 12/06 0701 - 12/07 0700 In: 3197.9 [P.O.:180; I.V.:2218.1; IV Piggyback:799.8] Out: 5200 [Urine:1800; Emesis/NG output:3400] Intake/Output this shift: No intake/output data recorded.  PE:  Gen:  Alert, NAD, pleasant, cooperative Card:  RRR, no M/G/R heard Pulm:  CTA, no W/R/R, rate and effort normal Abd: Soft, ND, hypoactive BS, stoma is pink, small amt of flatus in bag, midline with vac in place, mild TTP without guarding, no peritonitis  Skin: no rashes noted, warm and dry Extremities: no TTP or swelling to calves BL, no BLE edema   Anti-infectives: Anti-infectives (From admission, onward)   Start      Dose/Rate Route Frequency Ordered Stop   04/13/19 1645  ciprofloxacin (CIPRO) IVPB 400 mg     400 mg 200 mL/hr over 60 Minutes Intravenous Every 12 hours 04/13/19 1634     04/13/19 1645  metroNIDAZOLE (FLAGYL) IVPB 500 mg     500 mg 100 mL/hr over 60 Minutes Intravenous Every 8 hours 04/13/19 1634        Lab Results:  Recent Labs    04/16/19 0313  WBC 19.7*  HGB 12.0  HCT 37.2  PLT 380   BMET Recent Labs    04/16/19 0313  NA 137  K 3.4*  CL 97*  CO2 30  GLUCOSE 147*  BUN 7*  CREATININE 0.49  CALCIUM 8.0*   PT/INR No results for input(s): LABPROT, INR in the last 72 hours. CMP     Component Value Date/Time   NA 137 04/16/2019 0313   K 3.4 (L) 04/16/2019 0313   CL 97 (L) 04/16/2019 0313   CO2 30 04/16/2019 0313   GLUCOSE 147 (H) 04/16/2019 0313   BUN 7 (L) 04/16/2019 0313   CREATININE 0.49 04/16/2019 0313   CALCIUM 8.0 (L) 04/16/2019 0313   PROT 8.2 (H) 04/13/2019 1203   ALBUMIN 3.3 (L) 04/13/2019 1203   AST 12 (L) 04/13/2019 1203   ALT 12 04/13/2019 1203   ALKPHOS 93 04/13/2019 1203   BILITOT 0.6 04/13/2019 1203   GFRNONAA >60 04/16/2019 0313   GFRAA >60 04/16/2019 0313   Lipase     Component Value Date/Time   LIPASE 16 04/13/2019  1203    Studies/Results: No results found.   Kalman Drape, PA-C [facility] Surgery Please see amion for pager for the following: Myna Hidalgo, W, & Friday 7:00am - 4:30pm Thursdays 7:00am -11:30am

## 2019-04-18 LAB — BASIC METABOLIC PANEL
Anion gap: 12 (ref 5–15)
BUN: 10 mg/dL (ref 8–23)
CO2: 29 mmol/L (ref 22–32)
Calcium: 8.4 mg/dL — ABNORMAL LOW (ref 8.9–10.3)
Chloride: 98 mmol/L (ref 98–111)
Creatinine, Ser: 0.52 mg/dL (ref 0.44–1.00)
GFR calc Af Amer: >60 mL/min (ref >60)
GFR calc non Af Amer: >60 mL/min (ref >60)
Glucose, Bld: 118 mg/dL — ABNORMAL HIGH (ref 70–99)
Potassium: 3.4 mmol/L — ABNORMAL LOW (ref 3.5–5.1)
Sodium: 139 mmol/L (ref 135–145)

## 2019-04-18 LAB — CBC
HCT: 37.7 % (ref 36.0–46.0)
Hemoglobin: 12.1 g/dL (ref 12.0–15.0)
MCH: 26.2 pg (ref 26.0–34.0)
MCHC: 32.1 g/dL (ref 30.0–36.0)
MCV: 81.8 fL (ref 80.0–100.0)
Platelets: 467 10*3/uL — ABNORMAL HIGH (ref 150–400)
RBC: 4.61 MIL/uL (ref 3.87–5.11)
RDW: 14.5 % (ref 11.5–15.5)
WBC: 16.5 10*3/uL — ABNORMAL HIGH (ref 4.0–10.5)
nRBC: 0 % (ref 0.0–0.2)

## 2019-04-18 MED ORDER — OXYCODONE HCL 5 MG PO TABS
5.0000 mg | ORAL_TABLET | ORAL | Status: DC | PRN
  Filled 2019-04-18: qty 1

## 2019-04-18 MED ORDER — ACETAMINOPHEN 325 MG PO TABS
650.0000 mg | ORAL_TABLET | Freq: Four times a day (QID) | ORAL | Status: DC | PRN
  Administered 2019-04-18 – 2019-04-20 (×3): 650 mg via ORAL
  Filled 2019-04-18 (×3): qty 2

## 2019-04-18 NOTE — Progress Notes (Signed)
[facility]/Tra[facility] Progress Note  4 Days Post-Op   Assessment/Plan HTN - holding home meds  Large bowel obstruction - S/P Cystoscopy, Bilateral temporary ureteral stent placement, Dr. Claudia Desanctis, 12/04 - stents removed after OR - S/P sigmoid colectomy, SBR, end colostomy, rigid proctoscopy, Dr. Lucia Gaskins, 12/04 - + flatus and stool Leukocytosis - down to 16.5, monitor, continue IV abx  FEN -CLD VTE -lovenox ID -Cipro/Flagyl Foley: DC'd 12/07 Follow up: Dr. Lucia Gaskins  DISPO: DC NGT, CLD. Ambulate. Path pending.    LOS: 5 days    Subjective: CC: no complaints  Pt states she is feeling much better today. She denies issues overnight. No nausea, fever, chills.   Objective: Vital signs in last 24 hours: Temp:  [98.3 F (36.8 C)-98.6 F (37 C)] 98.3 F (36.8 C) (12/08 0547) Pulse Rate:  [79-89] 79 (12/08 0547) Resp:  [17-21] 21 (12/08 0547) BP: (150-177)/(79-88) 177/79 (12/08 0547) SpO2:  [92 %-94 %] 93 % (12/08 0547) Last BM Date: 04/18/19  Intake/Output from previous [place]: 12/07 0701 - 12/08 0700 In: 2479.4 [P.O.:60; I.V.:1919.4; IV Piggyback:500.1] Out: 1900 [Urine:800; Emesis/NG output:1100] Intake/Output this shift: No intake/output data recorded.  PE:  Gen:  Alert, NAD, pleasant, cooperative Card:  RRR, no M/G/R heard Pulm:  CTA, no W/R/R, rate and effort normal Abd: Soft, ND, + BS, stoma is pink, flatus and stool in bag, midline with vac in place, no TTP, no peritonitis  Skin: no rashes noted, warm and dry Extremities: no TTP or swelling to calves BL, no BLE edema   Anti-infectives: Anti-infectives (From admission, onward)   Start     Dose/Rate Route Frequency Ordered Stop   04/13/19 1645  ciprofloxacin (CIPRO) IVPB 400 mg     400 mg 200 mL/hr over 60 Minutes Intravenous Every 12 hours 04/13/19 1634     04/13/19 1645  metroNIDAZOLE (FLAGYL) IVPB 500 mg     500 mg 100 mL/hr over 60 Minutes Intravenous Every 8 hours 04/13/19 1634        Lab  Results:  Recent Labs    04/17/19 0843 04/18/19 0457  WBC 16.5* 16.5*  HGB 11.7* 12.1  HCT 37.2 37.7  PLT 471* 467*   BMET Recent Labs    04/17/19 0843 04/18/19 0457  NA 137 139  K 3.2* 3.4*  CL 91* 98  CO2 34* 29  GLUCOSE 141* 118*  BUN 8 10  CREATININE 0.50 0.52  CALCIUM 8.6* 8.4*   PT/INR No results for input(s): LABPROT, INR in the last 72 hours. CMP     Component Value Date/Time   NA 139 04/18/2019 0457   K 3.4 (L) 04/18/2019 0457   CL 98 04/18/2019 0457   CO2 29 04/18/2019 0457   GLUCOSE 118 (H) 04/18/2019 0457   BUN 10 04/18/2019 0457   CREATININE 0.52 04/18/2019 0457   CALCIUM 8.4 (L) 04/18/2019 0457   PROT 8.2 (H) 04/13/2019 1203   ALBUMIN 3.3 (L) 04/13/2019 1203   AST 12 (L) 04/13/2019 1203   ALT 12 04/13/2019 1203   ALKPHOS 93 04/13/2019 1203   BILITOT 0.6 04/13/2019 1203   GFRNONAA >60 04/18/2019 0457   GFRAA >60 04/18/2019 0457   Lipase     Component Value Date/Time   LIPASE 16 04/13/2019 1203    Studies/Results: Dg Abd Portable 1v  Result Date: 04/17/2019 CLINICAL DATA:  NG tube placement EXAM: PORTABLE ABDOMEN - 1 VIEW COMPARISON:  None. FINDINGS: Nasogastric tube with the tip projecting over the stomach. There is persistent gaseous distension  of the colon. There is no bowel dilatation to suggest obstruction. There is no evidence of pneumoperitoneum, portal venous gas or pneumatosis. There are no pathologic calcifications along the expected course of the ureters. The osseous structures are unremarkable. IMPRESSION: Nasogastric tube with the tip projecting over the stomach. Persistent gaseous distension of the colon. Electronically Signed   By: Kathreen Devoid   On: 04/17/2019 16:24     Kalman Drape, PA-C [facility] Surgery Please see amion for pager for the following: Myna Hidalgo, W, & Friday 7:00am - 4:30pm Thursdays 7:00am -11:30am

## 2019-04-18 NOTE — Evaluation (Signed)
Physical Therapy Evaluation Patient Details Name: [name] MRN: [id number] DOB: October 19, 1950 Today's Date: 04/18/2019   History of Present Illness  68 yo female admitted with bowel perforation. S/P sigmoid colectomy, bowel resection, end colostomy, ureteral stent placement 12/4.  Clinical Impression  On eval, pt was Min guard assist for mobility. She walked ~125 feet with a RW. Pt is unsteady. LOB x 1 during session. Discussed d/c plan-pt plans to return home where she lives with her husband. She is agreeable to HHPT f/u. She politely declines a RW. Will follow and progress activity as tolerated.     Follow Up Recommendations Home health PT;Supervision for mobility/OOB    Equipment Recommendations  3in1 (PT)(pt politely declines RW)    Recommendations for Other Services       Precautions / Restrictions Precautions Precautions: Fall Precaution Comments: L colostomy, abd wound vac Restrictions Weight Bearing Restrictions: No      Mobility  Bed Mobility Overal bed mobility: Needs Assistance Bed Mobility: Supine to Sit;Sit to Supine     Supine to sit: Supervision;HOB elevated Sit to supine: Supervision;HOB elevated   General bed mobility comments: for safety, lines  Transfers Overall transfer level: Needs assistance   Transfers: Sit to/from Stand Sit to Stand: Min guard         General transfer comment: for safety  Ambulation/Gait Ambulation/Gait assistance: Min guard Gait Distance (Feet): 125 Feet Assistive device: IV Pole Gait Pattern/deviations: Step-through pattern;Decreased stride length     General Gait Details: unsteady. pt fatigues fairly easily.  Stairs            Wheelchair Mobility    Modified Rankin (Stroke Patients Only)       Balance Overall balance assessment: Needs assistance           Standing balance-Leahy Scale: Fair                               Pertinent Vitals/Pain Pain Assessment: No/denies pain     Home Living Family/patient expects to be discharged to:: Private residence Living Arrangements: Spouse/significant other   Type of Home: Apartment Home Access: Stairs to enter   Technical brewer of Steps: 1 flight Home Layout: One level Home Equipment: None      Prior Function Level of Independence: Independent               Hand Dominance        Extremity/Trunk Assessment   Upper Extremity Assessment Upper Extremity Assessment: Generalized weakness    Lower Extremity Assessment Lower Extremity Assessment: Generalized weakness    Cervical / Trunk Assessment Cervical / Trunk Assessment: Normal  Communication   Communication: No difficulties  Cognition Arousal/Alertness: Awake/alert Behavior During Therapy: WFL for tasks assessed/performed Overall Cognitive Status: Within Functional Limits for tasks assessed                                        General Comments      Exercises     Assessment/Plan    PT Assessment Patient needs continued PT services  PT Problem List Decreased strength;Decreased mobility;Decreased balance;Decreased activity tolerance       PT Treatment Interventions DME instruction;Gait training;Therapeutic exercise;Therapeutic activities;Patient/family education;Functional mobility training;Balance training    PT Goals (Current goals can be found in the Care Plan section)  Acute Rehab PT Goals Patient Stated Goal:  home soon PT Goal Formulation: With patient/family Time For Goal Achievement: 05/02/19 Potential to Achieve Goals: Good    Frequency Min 3X/week   Barriers to discharge        Co-evaluation               AM-PAC PT "6 Clicks" Mobility  Outcome Measure Help needed turning from your back to your side while in a flat bed without using bedrails?: A Little Help needed moving from lying on your back to sitting on the side of a flat bed without using bedrails?: A Little Help needed moving  to and from a bed to a chair (including a wheelchair)?: A Little Help needed standing up from a chair using your arms (e.g., wheelchair or bedside chair)?: A Little Help needed to walk in hospital room?: A Little Help needed climbing 3-5 steps with a railing? : A Little 6 Click Score: 18    End of Session   Activity Tolerance: Patient tolerated treatment well Patient left: in bed;with call bell/phone within reach;with family/visitor present   PT Visit Diagnosis: Unsteadiness on feet (R26.81);Muscle weakness (generalized) (M62.81)    Time: [id number] PT Time Calculation (min) (ACUTE ONLY): 14 min   Charges:   PT Evaluation $PT Eval Moderate Complexity: Pepper Pike, PT Acute Rehabilitation Services Pager: [phone] Office: [phone]

## 2019-04-19 LAB — CBC
HCT: 36.3 % (ref 36.0–46.0)
Hemoglobin: 11.3 g/dL — ABNORMAL LOW (ref 12.0–15.0)
MCH: 25.4 pg — ABNORMAL LOW (ref 26.0–34.0)
MCHC: 31.1 g/dL (ref 30.0–36.0)
MCV: 81.6 fL (ref 80.0–100.0)
Platelets: 515 10*3/uL — ABNORMAL HIGH (ref 150–400)
RBC: 4.45 MIL/uL (ref 3.87–5.11)
RDW: 14.6 % (ref 11.5–15.5)
WBC: 14.2 10*3/uL — ABNORMAL HIGH (ref 4.0–10.5)
nRBC: 0 % (ref 0.0–0.2)

## 2019-04-19 LAB — BASIC METABOLIC PANEL
Anion gap: 9 (ref 5–15)
BUN: 11 mg/dL (ref 8–23)
CO2: 30 mmol/L (ref 22–32)
Calcium: 8.4 mg/dL — ABNORMAL LOW (ref 8.9–10.3)
Chloride: 101 mmol/L (ref 98–111)
Creatinine, Ser: 0.75 mg/dL (ref 0.44–1.00)
GFR calc Af Amer: >60 mL/min (ref >60)
GFR calc non Af Amer: >60 mL/min (ref >60)
Glucose, Bld: 103 mg/dL — ABNORMAL HIGH (ref 70–99)
Potassium: 3.6 mmol/L (ref 3.5–5.1)
Sodium: 140 mmol/L (ref 135–145)

## 2019-04-19 LAB — SURGICAL PATHOLOGY

## 2019-04-19 MED ORDER — ALBUTEROL SULFATE (2.5 MG/3ML) 0.083% IN NEBU: 3.0000 mL | INHALATION_SOLUTION | RESPIRATORY_TRACT | Status: DC | PRN

## 2019-04-19 MED ORDER — METOPROLOL SUCCINATE ER 50 MG PO TB24
100.0000 mg | ORAL_TABLET | Freq: Every day | ORAL | Status: DC
  Administered 2019-04-19 – 2019-04-21 (×3): 100 mg via ORAL
  Filled 2019-04-19 (×3): qty 2

## 2019-04-19 MED ORDER — PANTOPRAZOLE SODIUM 40 MG PO TBEC
40.0000 mg | DELAYED_RELEASE_TABLET | Freq: Two times a day (BID) | ORAL | Status: DC
  Administered 2019-04-19 – 2019-04-21 (×5): 40 mg via ORAL
  Filled 2019-04-19 (×5): qty 1

## 2019-04-19 MED ORDER — GABAPENTIN 300 MG PO CAPS
300.0000 mg | ORAL_CAPSULE | Freq: Three times a day (TID) | ORAL | Status: DC
  Administered 2019-04-19 – 2019-04-21 (×7): 300 mg via ORAL
  Filled 2019-04-19 (×7): qty 1

## 2019-04-19 NOTE — Consult Note (Addendum)
[place] Nurse ostomy follow up Stoma type/location: LUQ Colostomy Stomal assessment/size: 1 and 5/8 inch slightly oval, edema is more at proximal half than distal, giving the impression that os points downward. As edema continues to resolve, stoma will most like be slightly raised with os at center. Peristomal assessment: intact Treatment options for stomal/peristomal skin: skin barrier ring Output: very soft almost liquid brown stool Ostomy pouching: 2pc. 2 and 1/4 inch pouching system (either that or 2 and 3/4 inch pouching system will work.  Patient will have several in both sizes to get started.  Will eventually end up in 2 and 1/4 inch all the time as her stoma is likely to decrease in size to less than 1 and 1/2 inch Education provided: Patient taught to empty pouching system when 1/3 to 1/2 full.  Is able to demonstrate unrolling pouch, opening, simulated emptying and cleaning tail portion of pouch with toilet paper wicks. Will practice tonight and tomorrow morning with Nursing staff so that she is independent in emptying by time of discharge. Is anticipating discharge sometime tomorrow if she can tolerate her regular diet. HHRN will need to reinforce pouch change procedure when she visits since patient has not completed this independently yet due to having to lie in a reclining position for wound and stoma care in hospital. Patient is reading educational materials.1-page teaching sheet indicating the steps for two-piece pouch change procedure is reviewed. Enrolled patient in [place] Start Discharge program: Yes   [place] Nurse wound follow up Wound type: midline incision left open to heal by secondary intention Measurement: Wound bed: Per Monday, 14cm x 4cm x 4cm  Drainage (amount, consistency, odor) scant serous Periwound:intact Dressing procedure/placement/frequency: M/W/F NPWT dressing changes. One piece of black foam removed from wound. Cleansed wound with NS, patted dry.  Rim of wound  is protected with skin barrier ring stretched into strip and placed along periphery, particularly at umbilicus. One piece of black foam used to obliterate dead space, this is covered with drape, attached to 169mmHg continuous negative pressure via the T.R.A.C. pad and an immediate seal is achieved.  Patient was premedicated, but tolerated procedure well.  Ostomy supplies (2 set ups) in room and another 4 ordered for discharge for a total of 6. Extra rings provided for use with NPWT device dressings.  [place] nursing team will follow while in house, and will remain available to this patient, the nursing and medical teams.   Thanks, Maudie Flakes, MSN, RN, [place], Arther Abbott  Pager# [phone]

## 2019-04-19 NOTE — Progress Notes (Signed)
[facility]/Trauma Progress Note  5 Days Post-Op   Assessment/Plan HTN - home med  Large bowel obstruction - S/PCystoscopy,Bilateral temporary ureteral stent placement, Dr. Claudia Desanctis, 12/04 - stents removed after OR - S/P sigmoid colectomy, SBR, end colostomy, rigid proctoscopy, Dr. Lucia Gaskins, 12/04 - + flatus and stool - advance diet as tolerted - path showed diverticulitis Leukocytosis - down to 14.2, monitor, continue IV abx  FEN -FLD VTE -lovenox ID -Cipro/Flagyl Foley:DC'd 12/07 Follow up:Dr. Lucia Gaskins  DISPO:advancing diet, home soon, PT recommends HH PT. Pt lives with husband who works.     LOS: 6 days    Subjective: CC: mild abdominal pain  No issues overnight. Discussed path with pt. Discussed wound care vs vac.   Objective: Vital signs in last 24 hours: Temp:  [98.8 F (37.1 C)-99.1 F (37.3 C)] 98.8 F (37.1 C) (12/09 0532) Pulse Rate:  [77-85] 77 (12/09 0532) Resp:  [17-18] 17 (12/09 0532) BP: (154-180)/(77-87) 159/79 (12/09 0532) SpO2:  [92 %-95 %] 95 % (12/09 0532) Last BM Date: 04/18/19  Intake/Output from previous [place]: 12/08 0701 - 12/09 0700 In: 2629.7 [P.O.:440; I.V.:1425; IV Piggyback:764.7] Out: 1715 [Urine:1525; Stool:190] Intake/Output this shift: No intake/output data recorded.  PE:  Gen: Alert, NAD, pleasant, cooperative Card: RRR, no M/G/R heard Pulm: CTA, no W/R/R, rate andeffort normal Abd: Soft,ND, + BS, stoma is pink, flatus and stool in bag, midline with vac in place, no TTP, no peritonitis Skin: no rashes noted, warm and dry   Anti-infectives: Anti-infectives (From admission, onward)   Start     Dose/Rate Route Frequency Ordered Stop   04/13/19 1645  ciprofloxacin (CIPRO) IVPB 400 mg     400 mg 200 mL/hr over 60 Minutes Intravenous Every 12 hours 04/13/19 1634     04/13/19 1645  metroNIDAZOLE (FLAGYL) IVPB 500 mg     500 mg 100 mL/hr over 60 Minutes Intravenous Every 8 hours 04/13/19 1634         Lab Results:  Recent Labs    04/18/19 0457 04/19/19 0441  WBC 16.5* 14.2*  HGB 12.1 11.3*  HCT 37.7 36.3  PLT 467* 515*   BMET Recent Labs    04/18/19 0457 04/19/19 0441  NA 139 140  K 3.4* 3.6  CL 98 101  CO2 29 30  GLUCOSE 118* 103*  BUN 10 11  CREATININE 0.52 0.75  CALCIUM 8.4* 8.4*   PT/INR No results for input(s): LABPROT, INR in the last 72 hours. CMP     Component Value Date/Time   NA 140 04/19/2019 0441   K 3.6 04/19/2019 0441   CL 101 04/19/2019 0441   CO2 30 04/19/2019 0441   GLUCOSE 103 (H) 04/19/2019 0441   BUN 11 04/19/2019 0441   CREATININE 0.75 04/19/2019 0441   CALCIUM 8.4 (L) 04/19/2019 0441   PROT 8.2 (H) 04/13/2019 1203   ALBUMIN 3.3 (L) 04/13/2019 1203   AST 12 (L) 04/13/2019 1203   ALT 12 04/13/2019 1203   ALKPHOS 93 04/13/2019 1203   BILITOT 0.6 04/13/2019 1203   GFRNONAA >60 04/19/2019 0441   GFRAA >60 04/19/2019 0441   Lipase     Component Value Date/Time   LIPASE 16 04/13/2019 1203    Studies/Results: Dg Abd Portable 1v  Result Date: 04/17/2019 CLINICAL DATA:  NG tube placement EXAM: PORTABLE ABDOMEN - 1 VIEW COMPARISON:  None. FINDINGS: Nasogastric tube with the tip projecting over the stomach. There is persistent gaseous distension of the colon. There is no bowel dilatation to suggest  obstruction. There is no evidence of pneumoperitoneum, portal venous gas or pneumatosis. There are no pathologic calcifications along the expected course of the ureters. The osseous structures are unremarkable. IMPRESSION: Nasogastric tube with the tip projecting over the stomach. Persistent gaseous distension of the colon. Electronically Signed   By: Kathreen Devoid   On: 04/17/2019 16:24     Kalman Drape, PA-C [facility] Please see amion for pager for the following: Myna Hidalgo, W, & Friday 7:00am - 4:30pm Thursdays 7:00am -11:30am

## 2019-04-20 LAB — CREATININE, SERUM
Creatinine, Ser: 0.66 mg/dL (ref 0.44–1.00)
GFR calc Af Amer: >60 mL/min (ref >60)
GFR calc non Af Amer: >60 mL/min (ref >60)

## 2019-04-20 MED ORDER — MORPHINE SULFATE (PF) 2 MG/ML IV SOLN
2.0000 mg | INTRAVENOUS | Status: DC | PRN
  Administered 2019-04-20: 2 mg via INTRAVENOUS
  Filled 2019-04-20: qty 1

## 2019-04-20 MED ORDER — HYDROCODONE-ACETAMINOPHEN 10-325 MG PO TABS: 1.0000 | ORAL_TABLET | Freq: Four times a day (QID) | ORAL | 0 refills | Status: AC | PRN

## 2019-04-20 NOTE — Discharge Summary (Addendum)
Patient ID: [name] [id number] [birth year] 68 y.o.  Admit date: 04/13/2019 Discharge date: 04/21/2019  Admitting Diagnosis: Large bowel obstruction HTN  Discharge Diagnosis Patient Active Problem List   Diagnosis Date Noted  . Large bowel obstruction ([place]) 04/13/2019  . Irritable bowel syndrome with both constipation and diarrhea 06/11/2017  . Tobacco abuse 05/03/2012  . HTN (hypertension) 12/09/2011  . INFLAMED SEBORRHEIC KERATOSIS 04/29/2010  . 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 EDEMA 01/01/2009  . CERUMEN IMPACTION 10/19/2008  . HEADACHE 10/19/2008  . URINARY INCONTINENCE 10/19/2008    Consultants Urology, Dr. Claudia Desanctis  Reason for Admission: This is a very pleasant 68 year old black female with a history of hypertension who has been having some change in her stools over the last month or so with softer or loose stools but not firm like normal.  She denies any blood in her stool.  Over the last week she has had increasing abdominal pain along with an inability to pass stool.  She states that she last had a colonoscopy 4 years ago with Dr. Elmo Putt.  She was found to have a hyperplastic polyp that was removed.  It was recommended that she have a repeat colonoscopy in 5 years.               She does smoke about a pack of cigarettes a [place].  Other than that she does not have a family history of colon cancer.  She presented to the [facility] emergency department today for further evaluation given her distention, pain, and inability to move her bowels.  After evaluation and work-up she has been noted to have a leukocytosis along with a CT scan suggestive of a large bowel obstruction at the level of the sigmoid colon with a small pericolonic fluid collection.  We have been asked to evaluate the patient for further evaluation and recommendations.  Procedures Dr. Claudia Desanctis, 04/14/19 1. Cystoscopy 2. Bilateral temporary ureteral stent placement  Dr. Lucia Gaskins, 04/14/19  SIGMOID COLECTOMY, RESECTION OF SMALL BOWEL, END sigmoid COLOSTOMY, Rigid PROCTOSCOPY  Hospital Course:  The patient was admitted and taken to the OR the following [place] for a large bowel obstruction.  She had stents placed by urology prior to her procedure.  She underwent the above procedure and tolerated this well.  At the end of the procedure the patient had an abdominal wound VAC placed.  She had an NGT which remained in place for several days post op due to an ileus.  As this resolved, her NGT was removed and her diet was able to be advanced as tolerates.  Her colostomy began working well.  WOC was consulted for ostomy education.  PT was also consulted and recommended HH PT.  At the time of discharge, her wound VAC was changed and she was transitioned to NS WD dressing changes at home.  HH was arranged for her colostomy, wound, and PT needs at home.  On POD 7, she was felt stable for DC Home.  Physical Exam: See progress note PE from earlier today  Allergies as of 04/20/2019      Reactions   Lisinopril Anaphylaxis   Tongue swelling   Penicillins Anaphylaxis, Hives   Other    Strawberry-hives Eggs-nausea       Medication List    TAKE these medications   acetaminophen 650 MG CR tablet Commonly known as: TYLENOL Take 650 mg by mouth every 8 (eight) hours as needed for pain.   albuterol 108 (90 Base)  MCG/ACT inhaler Commonly known as: VENTOLIN HFA INHALE 2 PUFFS INTO THE LUNGS EVERY 4 HOURS AS NEEDED FOR WHEEZING OR SHORTNESS OF BREATH What changed: See the new instructions.   gabapentin 300 MG capsule Commonly known as: NEURONTIN TAKE 1 CAPSULE(300 MG) BY MOUTH THREE TIMES DAILY What changed:   how much to take  how to take this  when to take this   HYDROcodone-acetaminophen 10-325 MG tablet Commonly known as: NORCO Take 1-2 tablets by mouth every 6 (six) hours as needed for moderate pain. Start taking on: May 06, 2019 What changed:   how much to take  These  instructions start on May 06, 2019. If you are unsure what to do until then, ask your doctor or other care provider.   metoprolol succinate 100 MG 24 hr tablet Commonly known as: TOPROL-XL TAKE 1 TABLET BY MOUTH  DAILY WITH OR IMMEDIATLEY  FOLLOWING A MEAL What changed:   how much to take  how to take this  when to take this            Durable Medical Equipment  (From admission, onward)         Start     Ordered   04/19/19 1437  For home use only DME 3 n 1  Once     04/19/19 1436           Follow-up Information    Alphonsa Overall, MD Follow up in 3 week(s).   Specialty: General Surgery Why: Our office will call you with your appointment date and time Contact information: [address] [place] [address] [phone]           Signed: Saverio Danker, [facility] 04/20/2019, 11:12 AM Please see Amion for pager number during [place] hours 7:00am-4:30pm

## 2019-04-20 NOTE — Discharge Instructions (Signed)
Change dressing daily with normal saline moistened gauze and wring it out.  Then pack it into your wound and cover with dry gauze and tape.  You may remove the dressing and shower with your wound open to the water.  Repack when shower completed.  [facility], [place] [phone]  OPEN ABDOMINAL SURGERY: POST OP INSTRUCTIONS  Always review your discharge instruction sheet given to you by the facility where your surgery was performed.  IF YOU HAVE DISABILITY OR FAMILY LEAVE FORMS, YOU MUST BRING THEM TO THE OFFICE FOR PROCESSING.  PLEASE DO NOT GIVE THEM TO YOUR DOCTOR.  1. A prescription for pain medication may be given to you upon discharge.  Take your pain medication as prescribed, if needed.  If narcotic pain medicine is not needed, then you may take acetaminophen (Tylenol) or ibuprofen (Advil) as needed. 2. Take your usually prescribed medications unless otherwise directed. 3. If you need a refill on your pain medication, please contact your pharmacy. They will contact our office to request authorization.  Prescriptions will not be filled after 5pm or on week-ends. 4. You should follow a light diet the first few days after arrival home, such as soup and crackers, pudding, etc.unless your doctor has advised otherwise. A high-fiber, low fat diet can be resumed as tolerated.   Be sure to include lots of fluids daily. Most patients will experience some swelling and bruising on the chest and neck area.  Ice packs will help.  Swelling and bruising can take several days to resolve 5. Most patients will experience some swelling and bruising in the area of the incision. Ice pack will help. Swelling and bruising can take several days to resolve..  6. It is common to experience some constipation if taking pain medication after surgery.  Increasing fluid intake and taking a stool softener will usually help or prevent this problem from occurring.  A mild laxative (Milk of Magnesia or Miralax)  should be taken according to package directions if there are no bowel movements after 48 hours. 7.  You may have steri-strips (small skin tapes) in place directly over the incision.  These strips should be left on the skin for 7-10 days.  If your surgeon used skin glue on the incision, you may shower in 24 hours.  The glue will flake off over the next 2-3 weeks.  Any sutures or staples will be removed at the office during your follow-up visit. You may find that a light gauze bandage over your incision may keep your staples from being rubbed or pulled. You may shower and replace the bandage daily. 8. ACTIVITIES:  You may resume regular (light) daily activities beginning the next [place]--such as daily self-care, walking, climbing stairs--gradually increasing activities as tolerated.  You may have sexual intercourse when it is comfortable.  Refrain from any heavy lifting or straining until approved by your doctor. a. You may drive when you no longer are taking prescription pain medication, you can comfortably wear a seatbelt, and you can safely maneuver your car and apply brakes b. Return to Work: ___________________________________ 57. You should see your doctor in the office for a follow-up appointment approximately two weeks after your surgery.  Make sure that you call for this appointment within a [place] or two after you arrive home to insure a convenient appointment time. OTHER INSTRUCTIONS:  _____________________________________________________________ _____________________________________________________________  WHEN TO CALL YOUR DOCTOR: 1. Fever over 101.0 2. Inability to urinate 3. Nausea and/or vomiting  4. Extreme swelling or bruising 5. Continued bleeding from incision. 6. Increased pain, redness, or drainage from the incision. 7. Difficulty swallowing or breathing 8. Muscle cramping or spasms. 9. Numbness or tingling in hands or feet or around lips.  The clinic staff is available to answer  your questions during regular business hours.  Please dont hesitate to call and ask to speak to one of the nurses if you have concerns.  For further questions, please visit [email or web address]    Colostomy Home Guide, Adult  Colostomy surgery is done to create an opening in the front of the abdomen for stool (feces) to leave the body through an ostomy (stoma). Part of the large intestine is attached to the stoma. A bag, also called a pouch, is fitted over the stoma. Stool and gas will collect in the bag. After surgery, you will need to empty and change your colostomy bag as needed. You will also need to care for your stoma. How to care for the stoma Your stoma should look pink, red, and moist, like the inside of your cheek. Soon after surgery, the stoma may be swollen, but this swelling will go away within 6 weeks. To care for the stoma:  Keep the skin around the stoma clean and dry.  Use a clean, soft washcloth to gently wash the stoma and the skin around it. Clean using a circular motion, and wipe away from the stoma opening, not toward it. ? Use warm water and only use cleansers recommended by your health care provider. ? Rinse the stoma area with plain water. ? Dry the area around the stoma well.  Use stoma powder or ointment on your skin only as told by your health care provider. Do not use any other powders, gels, wipes, or creams on the skin around the stoma.  Check the stoma area every [place] for signs of infection. Check for: ? New or worsening redness, swelling, or pain. ? New or increased fluid or blood. ? Pus or warmth.  Measure the stoma opening regularly and record the size. Watch for changes. (It is normal for the stoma to get smaller as swelling goes away.) Share this information with your health care provider. How to empty the colostomy bag  Empty your bag at bedtime and whenever it is one-third to one-half full. Do not let the bag get more than half-full with  stool or gas. The bag could leak if it gets too full. Some colostomy bags have a built-in gas release valve that releases gas often throughout the [place]. Follow these basic steps: 1. Wash your hands with soap and water. 2. Sit far back on the toilet seat. 3. Put several pieces of toilet paper into the toilet water. This will prevent splashing as you empty stool into the toilet. 4. Remove the clip or the hook-and-loop fastener from the tail end of the bag. 5. Unroll the tail, then empty the stool into the toilet. 6. Clean the tail with toilet paper or a moist towelette. 7. Reroll the tail, and close it with the clip or the hook-and-loop fastener. 8. Wash your hands again. How to change the colostomy bag Change your bag every 3-4 days or as often as told by your health care provider. Also change the bag if it is leaking or separating from the skin, or if your skin around the stoma looks or feels irritated. Irritated skin may be a sign that the bag is leaking. Always have colostomy supplies with you, and follow  these basic steps: 1. Wash your hands with soap and water. Have paper towels or tissues nearby to clean any discharge. 2. Remove the old bag and skin barrier. Use your fingers or a warm cloth to gently push the skin away from the barrier. 3. Clean the stoma area with water or with mild soap and water, as directed. Use water to rinse away any soap. 4. Dry the skin. You may use the cool setting on a hair dryer to do this. 5. Use a tracing pattern (template) to cut the skin barrier to the size needed. 6. If you are using a two-piece bag, attach the bag and the skin barrier to each other. Add the barrier ring, if you use one. 7. If directed, apply stoma powder or skin barrier gel to the skin. 8. Warm the skin barrier with your hands, or blow with a hair dryer for 5-10 seconds. 9. Remove the paper from the adhesive strip of the skin barrier. 10. Press the adhesive strip onto the skin around the  stoma. 11. Gently rub the skin barrier onto the skin. This creates heat that helps the barrier to stick. 12. Apply stoma tape to the edges of the skin barrier, if desired. 73. Wash your hands again. General recommendations  Avoid wearing tight clothes or having anything press directly on your stoma or bag. Change your clothing whenever it is soiled or damp.  You may shower or bathe with the bag on or off. Do not use harsh or oily soaps or lotions. Dry the skin and bag after bathing.  Store all supplies in a cool, dry place. Do not leave supplies in extreme heat because some parts can melt or not stick as well.  Whenever you leave home, take extra clothing and an extra skin barrier and bag with you.  If your bag gets wet, you can dry it with a hair dryer on the cool setting.  To prevent odor, you may put drops of ostomy deodorizer in the bag.  If recommended by your health care provider, put ostomy lubricant inside the bag. This helps stool to slide out of the bag more easily and completely. Contact a health care provider if:  You have new or worsening redness, swelling, or pain around your stoma.  You have new or increased fluid or blood coming from your stoma.  Your stoma feels warm to the touch.  You have pus coming from your stoma.  Your stoma extends in or out farther than normal.  You need to change your bag every [place].  You have a fever. Get help right away if:  Your stool is bloody.  You have nausea or you vomit.  You have trouble breathing. Summary  Measure your stoma opening regularly and record the size. Watch for changes.  Empty your bag at bedtime and whenever it is one-third to one-half full. Do not let the bag get more than half-full with stool or gas.  Change your bag every 3-4 days or as often as told by your health care provider.  Whenever you leave home, take extra clothing and an extra skin barrier and bag with you. This information is not intended  to replace advice given to you by your health care provider. Make sure you discuss any questions you have with your health care provider. Document Released: 04/30/2003 Document Revised: 08/17/2018 Document Reviewed: 10/21/2016 Elsevier Patient Education  2020 [employer].

## 2019-04-20 NOTE — Progress Notes (Signed)
Patient ID: Jolane Gerten Haugan, female   DOB: [birth year], 68 y.o.   MRN: [id number]  6 Days Post-Op  Subjective: No complaints  Objective: Vital signs in last 24 hours: Temp:  [97.8 F (36.6 C)-98.6 F (37 C)] 98.3 F (36.8 C) (12/10 0535) Pulse Rate:  [62-72] 62 (12/10 0535) Resp:  [18-20] 20 (12/10 0535) BP: (143-166)/(73-79) 156/73 (12/10 0535) SpO2:  [93 %-97 %] 97 % (12/10 0535) Last BM Date: 04/18/19  Intake/Output from previous [place]: 12/09 0701 - 12/10 0700 In: 3488.7 [P.O.:600; I.V.:1725; IV Piggyback:1163.7] Out: 1750 [Urine:1750] Intake/Output this shift: Total I/O In: -  Out: 200 [Urine:200]  General appearance: alert and cooperative Resp: clear to auscultation bilaterally Cardio: regular rate and rhythm GI: Soft, minimal tenderness, ostomy pink and productive.  Wound clean.  Lab Results:  Recent Labs    04/18/19 0457 04/19/19 0441  WBC 16.5* 14.2*  HGB 12.1 11.3*  HCT 37.7 36.3  PLT 467* 515*   BMET Recent Labs    04/18/19 0457 04/19/19 0441 04/20/19 0453  NA 139 140  --   K 3.4* 3.6  --   CL 98 101  --   CO2 29 30  --   GLUCOSE 118* 103*  --   BUN 10 11  --   CREATININE 0.52 0.75 0.66  CALCIUM 8.4* 8.4*  --    PT/INR No results for input(s): LABPROT, INR in the last 72 hours. ABG No results for input(s): PHART, HCO3 in the last 72 hours.  Invalid input(s): PCO2, PO2  Studies/Results: No results found.  Anti-infectives: Anti-infectives (From admission, onward)   Start     Dose/Rate Route Frequency Ordered Stop   04/13/19 1645  ciprofloxacin (CIPRO) IVPB 400 mg     400 mg 200 mL/hr over 60 Minutes Intravenous Every 12 hours 04/13/19 1634     04/13/19 1645  metroNIDAZOLE (FLAGYL) IVPB 500 mg     500 mg 100 mL/hr over 60 Minutes Intravenous Every 8 hours 04/13/19 1634        Assessment/Plan: s/p Procedure(s): SIGMOID COLECTOMY, RESECTION OF SMALL BOWEL,WITH END COLOSTOMY, PROCTOSCOPY RIGID CYSTOSCOPY WITH BILATERAL STENT PLACEMENT  Advance diet  Will discontinue vacuum dressing and go to wet to dries so that she can do this at home.  Will have nurses teach the family how to do dressing changes. If she tolerates this then we will plan for discharge later today  LOS: 7 days    Autumn Messing III 04/20/2019

## 2019-04-20 NOTE — TOC Initial Note (Addendum)
Transition of Care [facility]) - Initial/Assessment Note    Patient Details  Name: [name] MRN: [id number] Date of Birth: [birth year]  Transition of Care [facility]) CM/SW Contact:    Lia Hopping, [place] Phone Number: 04/20/2019, 2:18 PM  Clinical Narrative:  CSW met with patient at bedside this am to discuss home health. Patient has a new colostomy and will need additional care/ teaching. Patient spouse will assist with her wet to dry dressing changes.  CSW reached out to the agencies listed below for staffing, at this time all [place] in the [place] do not have staffing or are out of network with the patient at policy.  CSW notified the RN and PA. CSW reached out to supervisor and [facility] Directors for additional support.   [facility]- No staffing available [facility]-No RN staffing [facility] with [place] policy [facility] [employer] of network with patient [place] policy [facility]- No staffing [facility]-Only accepting Medicare until Tuesday  [facility]- No staffing [facility]- No staffing [facility]-No RN staff available [place] staffing        CSW discussed findings with the patient at bedside. Patient reports, " I don't know how to take care of it." Patient does not feel comfortable discharging home without RN support. CSW notified the patient RN.   CSW informed PA about the patient concerns and discharge barrier to finding [place] with available staffing. Patient will stay an additional night, with plan to discharge tomorrow.   CSW received a call from [place]. She completed a [facility] referral on patient behalf for community services.   4:45pm CSW received a call from [place]. [place]. She volunteered her services to assist with [place] search. She faxed over a list of in network agencies. CSW compared the list.  CSW will reach out to the additional agencies in the am during business hours.  CSW will continue to assist with the patient discharge.      Expected Discharge Plan: [place] Services Barriers to Discharge: Other (comment)(No Home Health agency available to assist with colostomy or physical therapy.)   Patient Goals and CMS Choice Patient states their goals for this hospitalization and ongoing recovery are:: recover at home with spouse support CMS Medicare.gov Compare Post Acute Care list provided to:: Patient Choice offered to / list presented to : Patient  Expected Discharge Plan and Services Expected Discharge Plan: [place] In-house Referral: Clinical Social Work Discharge Planning Services: CM Consult Post Acute Care Choice: [place] arrangements for the past 2 months: Single Family Home Expected Discharge Date: 04/20/19               DME Arranged: 3-N-1 DME Agency: [employer] Date DME Agency Contacted: 04/20/19 Time DME Agency Contacted: 47 Representative spoke with at DME Agency: [place]: PT, RN   Date [place]: 04/20/19      Prior Living Arrangements/Services Living arrangements for the past 2 months: [place] with:: Spouse Patient language and need for interpreter reviewed:: No Do you feel safe going back to the place where you live?: Yes      Need for Family Participation in Patient Care: Yes (Comment) Care giver support system in place?: Yes (comment)   Criminal Activity/Legal Involvement Pertinent to Current Situation/Hospitalization: No - Comment as needed  Activities of Daily  Living Home Assistive Devices/Equipment: Eyeglasses, Dentures (specify type)(upper/lower dentures) ADL Screening (condition at time of admission) Patient's cognitive ability adequate to safely complete daily activities?: Yes Is the patient deaf or have difficulty hearing?: No Does the patient have  difficulty seeing, even when wearing glasses/contacts?: No Does the patient have difficulty concentrating, remembering, or making decisions?: No Patient able to express need for assistance with ADLs?: Yes Does the patient have difficulty dressing or bathing?: No Independently performs ADLs?: Yes (appropriate for developmental age) Does the patient have difficulty walking or climbing stairs?: No Weakness of Legs: None Weakness of Arms/Hands: None  Permission Sought/Granted   Permission granted to share information with : Yes, Verbal Permission Granted              Emotional Assessment Appearance:: Appears stated age Attitude/Demeanor/Rapport: Engaged Affect (typically observed): Accepting, Pleasant Orientation: : Oriented to Self, Oriented to Place, Oriented to  Time, Oriented to Situation Alcohol / Substance Use: Not Applicable Psych Involvement: No (comment)  Admission diagnosis:  Large bowel obstruction ([place]) [K56.609] Patient Active Problem List   Diagnosis Date Noted  . Large bowel obstruction ([place]) 04/13/2019  . Irritable bowel syndrome with both constipation and diarrhea 06/11/2017  . Tobacco abuse 05/03/2012  . HTN (hypertension) 12/09/2011  . INFLAMED SEBORRHEIC KERATOSIS 04/29/2010  . 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 EDEMA 01/01/2009  . CERUMEN IMPACTION 10/19/2008  . HEADACHE 10/19/2008  . URINARY INCONTINENCE 10/19/2008   PCP:  Laurey Morale, MD Pharmacy:   [id number] [place] [id number]: [phone]: [phone]    Social Determinants of Health (SDOH) Interventions    Readmission Risk Interventions No flowsheet data found.

## 2019-04-20 NOTE — Progress Notes (Signed)
Physical Therapy Treatment Patient Details Name: [name] MRN: [id number] DOB: 1950-06-15 Today's Date: 04/20/2019    History of Present Illness 68 yo female admitted with bowel perforation. S/P sigmoid colectomy, bowel resection, end colostomy, ureteral stent placement 12/4.    PT Comments    Pt able to decrease reliance of external support with gait today.  Recommend 3-1 BSC and HHPT.   Follow Up Recommendations  Home health PT;Supervision for mobility/OOB     Equipment Recommendations  3in1 (PT)    Recommendations for Other Services       Precautions / Restrictions Precautions Precautions: Fall Precaution Comments: L colostomy, abd wound vac Restrictions Weight Bearing Restrictions: No    Mobility  Bed Mobility               General bed mobility comments: sitting up on EOB upon arrival  Transfers Overall transfer level: Needs assistance Equipment used: None Transfers: Sit to/from Stand Sit to Stand: Supervision         General transfer comment: for safety  Ambulation/Gait Ambulation/Gait assistance: Min guard Gait Distance (Feet): 300 Feet Assistive device: None Gait Pattern/deviations: Step-through pattern Gait velocity: decreased   General Gait Details: Amb with IV pole for 1st 25' of gait then without UE support.  No LOB, but slightly guarded with decreased speed.   Stairs             Wheelchair Mobility    Modified Rankin (Stroke Patients Only)       Balance                                            Cognition Arousal/Alertness: Awake/alert Behavior During Therapy: WFL for tasks assessed/performed Overall Cognitive Status: Within Functional Limits for tasks assessed                                        Exercises      General Comments General comments (skin integrity, edema, etc.): Pt concerned about bathing at home, once she is cleared by MD.  Discussed DME.      Pertinent  Vitals/Pain Pain Assessment: Faces Faces Pain Scale: No hurt    Home Living                      Prior Function            PT Goals (current goals can now be found in the care plan section) Acute Rehab PT Goals Patient Stated Goal: home soon Potential to Achieve Goals: Good Progress towards PT goals: Progressing toward goals    Frequency    Min 3X/week      PT Plan Current plan remains appropriate    Co-evaluation              AM-PAC PT "6 Clicks" Mobility   Outcome Measure  Help needed turning from your back to your side while in a flat bed without using bedrails?: A Little Help needed moving from lying on your back to sitting on the side of a flat bed without using bedrails?: A Little Help needed moving to and from a bed to a chair (including a wheelchair)?: A Little Help needed standing up from a chair using your arms (e.g., wheelchair or bedside chair)?: A Little Help  needed to walk in hospital room?: A Little Help needed climbing 3-5 steps with a railing? : A Little 6 Click Score: 18    End of Session Equipment Utilized During Treatment: Gait belt Activity Tolerance: Patient tolerated treatment well Patient left: in bed;with call bell/phone within reach(sitting EOB)   PT Visit Diagnosis: Unsteadiness on feet (R26.81);Muscle weakness (generalized) (M62.81)     Time: [id number] PT Time Calculation (min) (ACUTE ONLY): 18 min  Charges:  $Gait Training: 8-22 mins                     [name] L. Tamala Julian, [place] Pager [phone] 04/20/2019    [name] 04/20/2019, 11:09 AM

## 2019-04-20 NOTE — Progress Notes (Signed)
Spouse here to be educated about moist to dry dressing. Writer changed dressing and spouse watched. He asked appropriate questions and could correctly verbalize proper procedure. Supplies given for 2-3 days. Discharge instructions reviewed with patient and spouse. Questions answered and both deny further questions. No prescriptions given. Spouse is driving patient home. Donne Hazel, RN

## 2019-04-20 NOTE — Consult Note (Addendum)
   [facility] CM Inpatient Consult   04/20/2019  [name] 03-Jan-1951 [id number]   Referral received from Inpatient TOC SW (per Suanne Marker) for transitional needs of patient new to colostomy.  Patient checked for needs of [place] Management services as a benefit from her [facility] plan and for 12% risk score for unplanned readmission and hospitalization.   Chart reviewed and MD history and physical on 04/13/19 show as: 68 year old black female with a history of hypertension who has been having some change in her stools over the last month or so,  presented to the [facility] emergency department for further evaluation given her distention, pain, and inability to move her bowels. CT scan suggestive of a large bowel obstruction. She underwent Sigmoid Colectomy, Resection of small bowel, END colostomy, Rigid Proctoscopy on 04/14/19.   Called and spoke to patient over the phone. HIPAA verified. Patient reports that she is suppose to be discharging home today, however, having issues in arranging for home health services.  Explained [facility] Care Management (CM) services as a community based care management program providing assistance with chronic disease management, pharmacy services, and social work services to assist patient with community needs. Explained to patient regarding possibility of Remote [facility] referral as partner of [place] for follow-up of community needs at home, and she agreed with it. Patient verbally consents to services that could be provided to her by [facility] CM for community follow up as she is new with colostomy (post-op Bradt 6).  Denies any barriers/ needs for transportation or pharmacy assistance. Patient voiced that she welcomes any assistance she could get with colostomy care as well as managing health needs when she transitions back home.  Spoke with Inpatient TOC SW regarding disposition and needs and made aware that [facility] will be following (with referral to Remote  Health). Was informed that patient's spouse will assist with her daily wet to dry dressing changes, although he works all [place], and will need additional care/ teaching with new colostomy.   Referral will be placed for [facility] RN CM to follow-up for complex care management and assess further needs post discharge.  Of note, [facility] Care Management services does not replace or interfere with any services that are arranged by inpatient case management or social work.   For questions and additional information, please call:  Ruthella Kirchman A. Pooja Camuso, BSN, RN-BC [facility] Liaison Cell: [phone]

## 2019-04-21 NOTE — Progress Notes (Signed)
Reviewed discharge instructions with patient. Per social work, [place], [facility] to come to patients home at 4:00pm to do dressing change. Supplies provided to patient for home dressing change today. Patient resting in bed, waiting on husband to arrive and has no further questions.

## 2019-04-21 NOTE — Progress Notes (Signed)
Physical Therapy Treatment Patient Details Name: [name] MRN: [id number] DOB: [birth year] Today's Date: 04/21/2019    History of Present Illness 68 yo female admitted with bowel perforation. S/P sigmoid colectomy, bowel resection, end colostomy, ureteral stent placement 12/4.    PT Comments    Pt progressing very well; no assist needed today with basic mobility, decr gait velocity however no LOB with higher level balance tasks. Do not feel pt needs f/u PT  Follow Up Recommendations  No PT follow up     Equipment Recommendations  3in1 (PT)    Recommendations for Other Services       Precautions / Restrictions Precautions Precaution Comments: L colostomy, abd wound vac Restrictions Weight Bearing Restrictions: No    Mobility  Bed Mobility Overal bed mobility: Modified Independent                Transfers Overall transfer level: Independent Equipment used: None                Ambulation/Gait Ambulation/Gait assistance: Supervision;Modified independent (Device/Increase time) Gait Distance (Feet): 360 Feet Assistive device: None Gait Pattern/deviations: Step-through pattern Gait velocity: decreased   General Gait Details: good stability, no LOB   Stairs             Wheelchair Mobility    Modified Rankin (Stroke Patients Only)       Balance Overall balance assessment: Needs assistance           Standing balance-Leahy Scale: Fair Standing balance comment: not tested to mod perturbations             High level balance activites: Turns;Sudden stops;Head turns;Direction changes High Level Balance Comments: no LOB with above, decr gait velocity d/t pain            Cognition Arousal/Alertness: Awake/alert Behavior During Therapy: WFL for tasks assessed/performed Overall Cognitive Status: Within Functional Limits for tasks assessed                                        Exercises      General Comments         Pertinent Vitals/Pain Pain Assessment: Faces Faces Pain Scale: Hurts a little bit Pain Location: abd Pain Descriptors / Indicators: Discomfort Pain Intervention(s): Limited activity within patient's tolerance;Monitored during session    Home Living                      Prior Function            PT Goals (current goals can now be found in the care plan section) Acute Rehab PT Goals Patient Stated Goal: home soon PT Goal Formulation: With patient/family Time For Goal Achievement: 05/02/19 Potential to Achieve Goals: Good Progress towards PT goals: Progressing toward goals    Frequency    Min 3X/week      PT Plan Current plan remains appropriate    Co-evaluation              AM-PAC PT "6 Clicks" Mobility   Outcome Measure  Help needed turning from your back to your side while in a flat bed without using bedrails?: None Help needed moving from lying on your back to sitting on the side of a flat bed without using bedrails?: None Help needed moving to and from a bed to a chair (including a wheelchair)?: None Help needed standing up  from a chair using your arms (e.g., wheelchair or bedside chair)?: None Help needed to walk in hospital room?: None Help needed climbing 3-5 steps with a railing? : A Little 6 Click Score: 23    End of Session   Activity Tolerance: Patient tolerated treatment well Patient left: in bed;in CPM   PT Visit Diagnosis: Unsteadiness on feet (R26.81);Muscle weakness (generalized) (M62.81)     Time: [id number] PT Time Calculation (min) (ACUTE ONLY): 11 min  Charges:  $Gait Training: 8-22 mins                     Kenyon Ana, PT  Pager: [phone] Acute Rehab Dept [facility] [place]): [id number]   04/21/2019    [facility] 04/21/2019, 10:53 AM

## 2019-04-21 NOTE — TOC Progression Note (Addendum)
Transition of Care [facility]) - Progression Note    Patient Details  Name: [name] MRN: [id number] Date of Birth: [birth year]  Transition of Care [facility]) CM/SW [place] Phone Number: 04/21/2019, 9:27 AM  Clinical Narrative:    CSW reached out to additional agencies   [place] is now [place]  [facility] is now [facility] [employer] and [place] is now [place] address is out of there service area  [place] is now [place] [facility] [place] is now [facility] [place] reached back out to [place]. Pricilla Handler. To discuss findings. Rep. Informed csw to reach back out to out of network agencies to determine if they will reconsider taking patient under different benefits.   CSW reached out to [facility] [employer].   [facility] is now willing to negotiate benefits... CSW notified [employer] Rep. Mary and provided her with Rep. Amy contact information to follow up. CSW provided Amy with [place] Rep. Contact as well. CSW waiting on a response.   [facility] Liaison informed CSW the patient application was accepted for the Remote [facility] program. An RN will provide care this evening. Attending RN, notified to send additional supplies home with the patient. Rep from Remote [facility] reached out to the patient this am and explain this information.   Still waiting on Response from [facility] Rep.   Expected Discharge Plan: [place] Services Barriers to Discharge: Other (comment)(No Home Health agency available to assist with colostomy or physical therapy.)  Expected Discharge Plan and Services Expected Discharge Plan: [place] In-house Referral: Clinical Social Work Discharge Planning Services: CM Consult Post Acute Care Choice: [place] arrangements for the past 2 months: Single Family Home Expected Discharge Date: 04/21/19               DME Arranged: 3-N-1 DME Agency: [employer] Date DME Agency Contacted: 04/20/19 Time DME Agency Contacted: 41 Representative spoke with at DME Agency: [place]: PT, RN   Date [place]: 04/20/19       Social Determinants of Health ([place]) Interventions    Readmission Risk Interventions No flowsheet data found.

## 2019-04-26 ENCOUNTER — Other Ambulatory Visit: Payer: Self-pay

## 2019-04-26 ENCOUNTER — Other Ambulatory Visit: Payer: Self-pay | Admitting: *Deleted

## 2019-04-26 NOTE — Patient Outreach (Signed)
[place] [facility]) Care Management  04/26/2019  [name] Dec 12, 1950 [id number]   Referral Received: 04/21/2019 Initial Outreach : 04/26/2019   Telephone Assessment-Successful-Preventive Measures  RN spoke with pt today and introduced [facility] services. RN explained the purpose of today's call and further inquired about possible [facility] services and/or assistance with any potential issues. Pt briefly discussed her recent surgery and possible needs. Pt reports her upcoming appointments and states she will need transportation resource for an upcoming appointment on 12/28 to her surgeon's office. RN offered a referral to [facility] social worker for assistance with transportation (receptive). Rn verified pt has all her medications and taking accordingly with no issues.   RN discussed a plan of care related to prevention measures (no readmissions), adherence to all medical appointments and medications. Pt receptive and agreed to enroll. RN will update pt's provider and send appropriate letters via [facility] services. Discussed the generated the plan of care with related goals and offered to follow up telephonically on a quarterly based. Pt receptive and also agreed to a follow up call over the next few weeks to completed the initial assessment.  THN CM Care Plan Problem One     Most Recent Value  Care Plan Problem One  Prevention measures related to post-op hospitalizatio  Role Documenting the Problem One  Care Management Telephonic [place] for Problem One  Active  THN Long Term Goal   Pt will have no hospital readmission over the next 90 days.  THN Long Term Goal Start Date  04/26/19  Interventions for Problem One Long Term Goal  Will strongly encouraged pt to follow all recommendations to avoid risk of readmission  THN CM Short Term Goal #1   [place] with medical appointments within the next 30 days  THN CM Short Term Goal #1 Start Date  04/26/19  Interventions for Short Term Goal  #1  Will verify upcoming appointments and offer resources for sufficient transportation. Stress the importance of attending all scheduled appointments to avoid any potential fees.  THN CM Short Term Goal #2   Adherence with post-op medications within the next 30 days  THN CM Short Term Goal #2 Start Date  04/26/19  Interventions for Short Term Goal #2  Will verifiy pt had all his post op medications and taking as recommended. Will educate accordingly on any medications for pt's adherence.      THN CM Care Plan Problem One     Most Recent Value  Care Plan Problem One  Prevention measures related to post-op hospitalizatio  Role Documenting the Problem One  Care Management Telephonic [place] for Problem One  Active  THN Long Term Goal   Pt will have no hospital readmission over the next 90 days.  THN Long Term Goal Start Date  04/26/19  Interventions for Problem One Long Term Goal  Will strongly encouraged pt to follow all recommendations to avoid risk of readmission  THN CM Short Term Goal #1   [place]de[place] with medical appointments within the next 30 days  THN CM Short Term Goal #1 Start Date  04/26/19  Interventions for Short Term Goal #1  Will verify upcoming appointments and offer resources for sufficient transportation. Stress the importance of attending all scheduled appointments to avoid any potential fees.  THN CM Short Term Goal #2   Adherence with post-op medications within the next 30 days  THN CM Short Term Goal #2 Start Date  04/26/19  Interventions for Short Term  Goal #2  Will verifiy pt had all his post op medications and taking as recommended. Will educate accordingly on any medications for pt's adherence.      [name], RN Care Management Coordinator [place] Office [phone]

## 2019-04-26 NOTE — Patient Outreach (Signed)
[place] [facility]) Care Management  04/26/2019  [name] 11-21-1950 [id number]  Social work referral received from [facility], [name], to outreach patient for transportation resource. Successful outreach to patient today.  Patient recently had surgery and is unable to drive at this time.  She has follow up appointment at [facility] on 05/08/19 @ 3:00PM.  Patient will have better understanding of how long she will need transportation resource after this appointment.  Transportation arranged for this appointment via [facility].   Patient did request that SCAT application be submitted in case there is a need for transportation after appointment. Application completed and sent to SCAT eligibility.  Informed patient that it typically takes 2-3 weeks for applications to be processed.  Will follow up with eligibility within the next two weeks regarding determination.  Ronn Melena, BSW Social Worker [phone]

## 2019-04-27 DIAGNOSIS — T8131XD Disruption of external operation (surgical) wound, not elsewhere classified, subsequent encounter: Secondary | ICD-10-CM | POA: Diagnosis not present

## 2019-04-27 DIAGNOSIS — Z48815 Encounter for surgical aftercare following surgery on the digestive system: Secondary | ICD-10-CM | POA: Diagnosis not present

## 2019-04-27 DIAGNOSIS — Z933 Colostomy status: Secondary | ICD-10-CM | POA: Diagnosis not present

## 2019-04-27 DIAGNOSIS — K56609 Unspecified intestinal obstruction, unspecified as to partial versus complete obstruction: Secondary | ICD-10-CM | POA: Diagnosis not present

## 2019-04-27 DIAGNOSIS — Z4801 Encounter for change or removal of surgical wound dressing: Secondary | ICD-10-CM | POA: Diagnosis not present

## 2019-04-27 DIAGNOSIS — I10 Essential (primary) hypertension: Secondary | ICD-10-CM | POA: Diagnosis not present

## 2019-04-27 DIAGNOSIS — Z433 Encounter for attention to colostomy: Secondary | ICD-10-CM | POA: Diagnosis not present

## 2019-04-27 DIAGNOSIS — M5030 Other cervical disc degeneration, unspecified cervical region: Secondary | ICD-10-CM | POA: Diagnosis not present

## 2019-04-27 DIAGNOSIS — K582 Mixed irritable bowel syndrome: Secondary | ICD-10-CM | POA: Diagnosis not present

## 2019-04-27 DIAGNOSIS — R69 Illness, unspecified: Secondary | ICD-10-CM | POA: Diagnosis not present

## 2019-04-28 DIAGNOSIS — Z4801 Encounter for change or removal of surgical wound dressing: Secondary | ICD-10-CM | POA: Diagnosis not present

## 2019-04-28 DIAGNOSIS — M5030 Other cervical disc degeneration, unspecified cervical region: Secondary | ICD-10-CM | POA: Diagnosis not present

## 2019-04-28 DIAGNOSIS — Z433 Encounter for attention to colostomy: Secondary | ICD-10-CM | POA: Diagnosis not present

## 2019-04-28 DIAGNOSIS — Z48815 Encounter for surgical aftercare following surgery on the digestive system: Secondary | ICD-10-CM | POA: Diagnosis not present

## 2019-04-28 DIAGNOSIS — R69 Illness, unspecified: Secondary | ICD-10-CM | POA: Diagnosis not present

## 2019-04-28 DIAGNOSIS — K582 Mixed irritable bowel syndrome: Secondary | ICD-10-CM | POA: Diagnosis not present

## 2019-04-28 DIAGNOSIS — I10 Essential (primary) hypertension: Secondary | ICD-10-CM | POA: Diagnosis not present

## 2019-05-01 DIAGNOSIS — I10 Essential (primary) hypertension: Secondary | ICD-10-CM | POA: Diagnosis not present

## 2019-05-01 DIAGNOSIS — R69 Illness, unspecified: Secondary | ICD-10-CM | POA: Diagnosis not present

## 2019-05-01 DIAGNOSIS — Z4801 Encounter for change or removal of surgical wound dressing: Secondary | ICD-10-CM | POA: Diagnosis not present

## 2019-05-01 DIAGNOSIS — K582 Mixed irritable bowel syndrome: Secondary | ICD-10-CM | POA: Diagnosis not present

## 2019-05-01 DIAGNOSIS — Z433 Encounter for attention to colostomy: Secondary | ICD-10-CM | POA: Diagnosis not present

## 2019-05-01 DIAGNOSIS — Z48815 Encounter for surgical aftercare following surgery on the digestive system: Secondary | ICD-10-CM | POA: Diagnosis not present

## 2019-05-01 DIAGNOSIS — M5030 Other cervical disc degeneration, unspecified cervical region: Secondary | ICD-10-CM | POA: Diagnosis not present

## 2019-05-06 DIAGNOSIS — I10 Essential (primary) hypertension: Secondary | ICD-10-CM | POA: Diagnosis not present

## 2019-05-06 DIAGNOSIS — K582 Mixed irritable bowel syndrome: Secondary | ICD-10-CM | POA: Diagnosis not present

## 2019-05-06 DIAGNOSIS — M5030 Other cervical disc degeneration, unspecified cervical region: Secondary | ICD-10-CM | POA: Diagnosis not present

## 2019-05-06 DIAGNOSIS — R69 Illness, unspecified: Secondary | ICD-10-CM | POA: Diagnosis not present

## 2019-05-06 DIAGNOSIS — Z48815 Encounter for surgical aftercare following surgery on the digestive system: Secondary | ICD-10-CM | POA: Diagnosis not present

## 2019-05-06 DIAGNOSIS — Z433 Encounter for attention to colostomy: Secondary | ICD-10-CM | POA: Diagnosis not present

## 2019-05-06 DIAGNOSIS — Z4801 Encounter for change or removal of surgical wound dressing: Secondary | ICD-10-CM | POA: Diagnosis not present

## 2019-05-08 DIAGNOSIS — K582 Mixed irritable bowel syndrome: Secondary | ICD-10-CM | POA: Diagnosis not present

## 2019-05-08 DIAGNOSIS — Z433 Encounter for attention to colostomy: Secondary | ICD-10-CM | POA: Diagnosis not present

## 2019-05-08 DIAGNOSIS — M5030 Other cervical disc degeneration, unspecified cervical region: Secondary | ICD-10-CM | POA: Diagnosis not present

## 2019-05-08 DIAGNOSIS — I10 Essential (primary) hypertension: Secondary | ICD-10-CM | POA: Diagnosis not present

## 2019-05-08 DIAGNOSIS — R69 Illness, unspecified: Secondary | ICD-10-CM | POA: Diagnosis not present

## 2019-05-08 DIAGNOSIS — Z4801 Encounter for change or removal of surgical wound dressing: Secondary | ICD-10-CM | POA: Diagnosis not present

## 2019-05-08 DIAGNOSIS — Z48815 Encounter for surgical aftercare following surgery on the digestive system: Secondary | ICD-10-CM | POA: Diagnosis not present

## 2019-05-10 ENCOUNTER — Other Ambulatory Visit: Payer: Self-pay

## 2019-05-10 DIAGNOSIS — Z933 Colostomy status: Secondary | ICD-10-CM | POA: Diagnosis not present

## 2019-05-10 DIAGNOSIS — K56609 Unspecified intestinal obstruction, unspecified as to partial versus complete obstruction: Secondary | ICD-10-CM | POA: Diagnosis not present

## 2019-05-10 DIAGNOSIS — T8131XD Disruption of external operation (surgical) wound, not elsewhere classified, subsequent encounter: Secondary | ICD-10-CM | POA: Diagnosis not present

## 2019-05-10 NOTE — Patient Outreach (Signed)
[place] [facility]) Care Management  05/10/2019  Malikah Regula Beaumier Jan 25, 1951 [id number]   Follow up call to patient today regarding social work referral for transportation.  Patient denies the need for ongoing transportation assistance/resource at this time.  Requested that SCAT application be disregarded.  Communicated this to SCAT eligibility.   Social work case being closed.   Ronn Melena, BSW Social Worker [phone]

## 2019-05-11 DIAGNOSIS — K582 Mixed irritable bowel syndrome: Secondary | ICD-10-CM | POA: Diagnosis not present

## 2019-05-11 DIAGNOSIS — R69 Illness, unspecified: Secondary | ICD-10-CM | POA: Diagnosis not present

## 2019-05-11 DIAGNOSIS — Z4801 Encounter for change or removal of surgical wound dressing: Secondary | ICD-10-CM | POA: Diagnosis not present

## 2019-05-11 DIAGNOSIS — Z48815 Encounter for surgical aftercare following surgery on the digestive system: Secondary | ICD-10-CM | POA: Diagnosis not present

## 2019-05-11 DIAGNOSIS — Z433 Encounter for attention to colostomy: Secondary | ICD-10-CM | POA: Diagnosis not present

## 2019-05-11 DIAGNOSIS — I10 Essential (primary) hypertension: Secondary | ICD-10-CM | POA: Diagnosis not present

## 2019-05-11 DIAGNOSIS — M5030 Other cervical disc degeneration, unspecified cervical region: Secondary | ICD-10-CM | POA: Diagnosis not present

## 2019-05-15 DIAGNOSIS — K582 Mixed irritable bowel syndrome: Secondary | ICD-10-CM | POA: Diagnosis not present

## 2019-05-15 DIAGNOSIS — Z433 Encounter for attention to colostomy: Secondary | ICD-10-CM | POA: Diagnosis not present

## 2019-05-15 DIAGNOSIS — Z4801 Encounter for change or removal of surgical wound dressing: Secondary | ICD-10-CM | POA: Diagnosis not present

## 2019-05-15 DIAGNOSIS — M5030 Other cervical disc degeneration, unspecified cervical region: Secondary | ICD-10-CM | POA: Diagnosis not present

## 2019-05-15 DIAGNOSIS — I10 Essential (primary) hypertension: Secondary | ICD-10-CM | POA: Diagnosis not present

## 2019-05-15 DIAGNOSIS — R69 Illness, unspecified: Secondary | ICD-10-CM | POA: Diagnosis not present

## 2019-05-15 DIAGNOSIS — Z48815 Encounter for surgical aftercare following surgery on the digestive system: Secondary | ICD-10-CM | POA: Diagnosis not present

## 2019-05-19 ENCOUNTER — Encounter: Payer: Self-pay | Admitting: *Deleted

## 2019-05-19 ENCOUNTER — Other Ambulatory Visit: Payer: Self-pay | Admitting: *Deleted

## 2019-05-19 NOTE — Patient Outreach (Signed)
[facility]) Care Management  05/19/2019  [name] 1950/09/21 [id number]    Telephone Assessment-Successful  RN spoke with pt today and received an update on her ongoing management of care related to prevention measures. Will reiterate on the plan of care and continue to offer available resources to assist with her management of care. Pt states she has complied with taking all her prescribed medications as ordered and attending all virtual and office visits on time with no missed appointments. Praise pt for her efforts in managing her care. RN able to completed the initial assessment information and continue to inquire on any additional needs.   Based upon pt's ongoing management of care will follow up in March on the ongoing plan of care.  THN CM Care Plan Problem One     Most Recent Value  Care Plan Problem One  Prevention measures related to post-op hospitalizatio  Role Documenting the Problem One  Care Management Telephonic [place] for Problem One  Active  THN Long Term Goal   Pt will have no hospital readmission over the next 90 days.  THN Long Term Goal Start Date  04/26/19  Interventions for Problem One Long Term Goal  Will continue to reiterate on this goal with ongoing prevention measures to avoid readmissions. Will continue to re-evaluate this goal and provider resources to assist with pt's ongoing management of care.  THN CM Short Term Goal #1   [place] with medical appointments within the next 30 days  THN CM Short Term Goal #1 Start Date  04/26/19  [facility] CM Short Term Goal #1 Met Date  05/19/19  THN CM Short Term Goal #2   Adherence with post-op medications within the next 30 days  THN CM Short Term Goal #2 Start Date  04/26/19  [facility] CM Short Term Goal #2 Met Date  05/19/19      [name], RN Care Management Coordinator [place] Office [phone]

## 2019-05-21 DIAGNOSIS — Z433 Encounter for attention to colostomy: Secondary | ICD-10-CM | POA: Diagnosis not present

## 2019-05-21 DIAGNOSIS — R69 Illness, unspecified: Secondary | ICD-10-CM | POA: Diagnosis not present

## 2019-05-21 DIAGNOSIS — M5030 Other cervical disc degeneration, unspecified cervical region: Secondary | ICD-10-CM | POA: Diagnosis not present

## 2019-05-21 DIAGNOSIS — K582 Mixed irritable bowel syndrome: Secondary | ICD-10-CM | POA: Diagnosis not present

## 2019-05-21 DIAGNOSIS — I10 Essential (primary) hypertension: Secondary | ICD-10-CM | POA: Diagnosis not present

## 2019-05-21 DIAGNOSIS — Z4801 Encounter for change or removal of surgical wound dressing: Secondary | ICD-10-CM | POA: Diagnosis not present

## 2019-05-21 DIAGNOSIS — Z48815 Encounter for surgical aftercare following surgery on the digestive system: Secondary | ICD-10-CM | POA: Diagnosis not present

## 2019-05-24 DIAGNOSIS — Z933 Colostomy status: Secondary | ICD-10-CM | POA: Diagnosis not present

## 2019-05-24 DIAGNOSIS — T8131XD Disruption of external operation (surgical) wound, not elsewhere classified, subsequent encounter: Secondary | ICD-10-CM | POA: Diagnosis not present

## 2019-05-24 DIAGNOSIS — K56609 Unspecified intestinal obstruction, unspecified as to partial versus complete obstruction: Secondary | ICD-10-CM | POA: Diagnosis not present

## 2019-05-26 DIAGNOSIS — Z48815 Encounter for surgical aftercare following surgery on the digestive system: Secondary | ICD-10-CM | POA: Diagnosis not present

## 2019-05-26 DIAGNOSIS — I10 Essential (primary) hypertension: Secondary | ICD-10-CM | POA: Diagnosis not present

## 2019-05-26 DIAGNOSIS — R69 Illness, unspecified: Secondary | ICD-10-CM | POA: Diagnosis not present

## 2019-05-26 DIAGNOSIS — K582 Mixed irritable bowel syndrome: Secondary | ICD-10-CM | POA: Diagnosis not present

## 2019-05-26 DIAGNOSIS — M5030 Other cervical disc degeneration, unspecified cervical region: Secondary | ICD-10-CM | POA: Diagnosis not present

## 2019-05-26 DIAGNOSIS — Z433 Encounter for attention to colostomy: Secondary | ICD-10-CM | POA: Diagnosis not present

## 2019-05-26 DIAGNOSIS — Z4801 Encounter for change or removal of surgical wound dressing: Secondary | ICD-10-CM | POA: Diagnosis not present

## 2019-05-27 DIAGNOSIS — Z48815 Encounter for surgical aftercare following surgery on the digestive system: Secondary | ICD-10-CM | POA: Diagnosis not present

## 2019-05-27 DIAGNOSIS — M5030 Other cervical disc degeneration, unspecified cervical region: Secondary | ICD-10-CM | POA: Diagnosis not present

## 2019-05-27 DIAGNOSIS — Z4801 Encounter for change or removal of surgical wound dressing: Secondary | ICD-10-CM | POA: Diagnosis not present

## 2019-05-27 DIAGNOSIS — I10 Essential (primary) hypertension: Secondary | ICD-10-CM | POA: Diagnosis not present

## 2019-05-27 DIAGNOSIS — R69 Illness, unspecified: Secondary | ICD-10-CM | POA: Diagnosis not present

## 2019-05-27 DIAGNOSIS — K582 Mixed irritable bowel syndrome: Secondary | ICD-10-CM | POA: Diagnosis not present

## 2019-05-27 DIAGNOSIS — Z433 Encounter for attention to colostomy: Secondary | ICD-10-CM | POA: Diagnosis not present

## 2019-05-29 ENCOUNTER — Other Ambulatory Visit: Payer: Self-pay

## 2019-05-29 DIAGNOSIS — I10 Essential (primary) hypertension: Secondary | ICD-10-CM | POA: Diagnosis not present

## 2019-05-29 DIAGNOSIS — Z433 Encounter for attention to colostomy: Secondary | ICD-10-CM | POA: Diagnosis not present

## 2019-05-29 DIAGNOSIS — K582 Mixed irritable bowel syndrome: Secondary | ICD-10-CM | POA: Diagnosis not present

## 2019-05-29 DIAGNOSIS — R69 Illness, unspecified: Secondary | ICD-10-CM | POA: Diagnosis not present

## 2019-05-29 DIAGNOSIS — Z48815 Encounter for surgical aftercare following surgery on the digestive system: Secondary | ICD-10-CM | POA: Diagnosis not present

## 2019-05-29 DIAGNOSIS — Z4801 Encounter for change or removal of surgical wound dressing: Secondary | ICD-10-CM | POA: Diagnosis not present

## 2019-05-29 DIAGNOSIS — M5030 Other cervical disc degeneration, unspecified cervical region: Secondary | ICD-10-CM | POA: Diagnosis not present

## 2019-05-30 ENCOUNTER — Ambulatory Visit (INDEPENDENT_AMBULATORY_CARE_PROVIDER_SITE_OTHER): Payer: Medicare HMO | Admitting: Family Medicine

## 2019-05-30 ENCOUNTER — Encounter: Payer: Self-pay | Admitting: Family Medicine

## 2019-05-30 VITALS — BP 150/78 | HR 78 | Temp 97.3°F | Wt 184.2 lb

## 2019-05-30 DIAGNOSIS — K56609 Unspecified intestinal obstruction, unspecified as to partial versus complete obstruction: Secondary | ICD-10-CM

## 2019-05-30 DIAGNOSIS — Z9049 Acquired absence of other specified parts of digestive tract: Secondary | ICD-10-CM | POA: Diagnosis not present

## 2019-05-30 DIAGNOSIS — H6123 Impacted cerumen, bilateral: Secondary | ICD-10-CM

## 2019-05-30 NOTE — Progress Notes (Signed)
   Subjective:    Patient ID: [name], female    DOB: May 12, 1950, 69 y.o.   MRN: [id number]  HPI Here to have her ears cleaned out. For several days she has had muffled hearing on both sides. No pain or sinus congestion. We also wanted to follow up her recent sigmoid colectomy due to a large bowel obstruction. The surgery went well, and she has a temporary colostomy. Hopefully in 3-6 months this can be taken down. She feels [name] and her bowels move regularly. 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    Review of Systems  Constitutional: Negative.   HENT: Positive for hearing loss. Negative for congestion, ear pain, sinus pressure and sinus pain.   Respiratory: Negative.   Cardiovascular: Negative.   Gastrointestinal: Negative.        Objective:   Physical Exam Constitutional:      Appearance: Normal appearance.  HENT:     Ears:     Comments: Both ear canals are full of cerumen     Nose: Nose normal.     Mouth/Throat:     Pharynx: Oropharynx is clear.  Eyes:     Conjunctiva/sclera: Conjunctivae normal.  Cardiovascular:     Rate and Rhythm: Normal rate and regular rhythm.     Pulses: Normal pulses.     Heart sounds: Normal heart sounds.  Pulmonary:     Effort: Pulmonary effort is normal.     Breath sounds: Normal breath sounds.  Abdominal:     General: Abdomen is flat. Bowel sounds are normal. There is no distension.     Palpations: Abdomen is soft. There is no mass.     Tenderness: There is no abdominal tenderness. There is no guarding or rebound.     Hernia: No hernia is present.     Comments: Ostomy is clean    Lymphadenopathy:     Cervical: No cervical adenopathy.  Neurological:     Mental Status: She is alert.           Assessment & Plan:  For the cerumen impactions, both ears were irrigated clear with water. For the large bowel obstruction, she will follow up with Surgery. I  advised her to use Miralax with a full glass of water or juice every [place] to keep the stools soft and moving.  Alysia Penna, MD

## 2019-06-02 DIAGNOSIS — K582 Mixed irritable bowel syndrome: Secondary | ICD-10-CM | POA: Diagnosis not present

## 2019-06-02 DIAGNOSIS — I10 Essential (primary) hypertension: Secondary | ICD-10-CM | POA: Diagnosis not present

## 2019-06-02 DIAGNOSIS — Z4801 Encounter for change or removal of surgical wound dressing: Secondary | ICD-10-CM | POA: Diagnosis not present

## 2019-06-02 DIAGNOSIS — Z48815 Encounter for surgical aftercare following surgery on the digestive system: Secondary | ICD-10-CM | POA: Diagnosis not present

## 2019-06-02 DIAGNOSIS — Z433 Encounter for attention to colostomy: Secondary | ICD-10-CM | POA: Diagnosis not present

## 2019-06-02 DIAGNOSIS — M5030 Other cervical disc degeneration, unspecified cervical region: Secondary | ICD-10-CM | POA: Diagnosis not present

## 2019-06-02 DIAGNOSIS — R69 Illness, unspecified: Secondary | ICD-10-CM | POA: Diagnosis not present

## 2019-06-05 DIAGNOSIS — K582 Mixed irritable bowel syndrome: Secondary | ICD-10-CM | POA: Diagnosis not present

## 2019-06-05 DIAGNOSIS — Z48815 Encounter for surgical aftercare following surgery on the digestive system: Secondary | ICD-10-CM | POA: Diagnosis not present

## 2019-06-05 DIAGNOSIS — Z433 Encounter for attention to colostomy: Secondary | ICD-10-CM | POA: Diagnosis not present

## 2019-06-05 DIAGNOSIS — M5030 Other cervical disc degeneration, unspecified cervical region: Secondary | ICD-10-CM | POA: Diagnosis not present

## 2019-06-05 DIAGNOSIS — R69 Illness, unspecified: Secondary | ICD-10-CM | POA: Diagnosis not present

## 2019-06-05 DIAGNOSIS — Z4801 Encounter for change or removal of surgical wound dressing: Secondary | ICD-10-CM | POA: Diagnosis not present

## 2019-06-05 DIAGNOSIS — I10 Essential (primary) hypertension: Secondary | ICD-10-CM | POA: Diagnosis not present

## 2019-06-12 DIAGNOSIS — T8131XD Disruption of external operation (surgical) wound, not elsewhere classified, subsequent encounter: Secondary | ICD-10-CM | POA: Diagnosis not present

## 2019-06-12 DIAGNOSIS — Z933 Colostomy status: Secondary | ICD-10-CM | POA: Diagnosis not present

## 2019-06-12 DIAGNOSIS — K56609 Unspecified intestinal obstruction, unspecified as to partial versus complete obstruction: Secondary | ICD-10-CM | POA: Diagnosis not present

## 2019-06-14 DIAGNOSIS — Z4801 Encounter for change or removal of surgical wound dressing: Secondary | ICD-10-CM | POA: Diagnosis not present

## 2019-06-14 DIAGNOSIS — R69 Illness, unspecified: Secondary | ICD-10-CM | POA: Diagnosis not present

## 2019-06-14 DIAGNOSIS — Z48815 Encounter for surgical aftercare following surgery on the digestive system: Secondary | ICD-10-CM | POA: Diagnosis not present

## 2019-06-14 DIAGNOSIS — M5030 Other cervical disc degeneration, unspecified cervical region: Secondary | ICD-10-CM | POA: Diagnosis not present

## 2019-06-14 DIAGNOSIS — I10 Essential (primary) hypertension: Secondary | ICD-10-CM | POA: Diagnosis not present

## 2019-06-14 DIAGNOSIS — Z433 Encounter for attention to colostomy: Secondary | ICD-10-CM | POA: Diagnosis not present

## 2019-06-14 DIAGNOSIS — K582 Mixed irritable bowel syndrome: Secondary | ICD-10-CM | POA: Diagnosis not present

## 2019-06-20 DIAGNOSIS — H5010 Unspecified exotropia: Secondary | ICD-10-CM | POA: Diagnosis not present

## 2019-06-20 DIAGNOSIS — H52203 Unspecified astigmatism, bilateral: Secondary | ICD-10-CM | POA: Diagnosis not present

## 2019-06-20 DIAGNOSIS — Z961 Presence of intraocular lens: Secondary | ICD-10-CM | POA: Diagnosis not present

## 2019-06-20 DIAGNOSIS — H53001 Unspecified amblyopia, right eye: Secondary | ICD-10-CM | POA: Diagnosis not present

## 2019-06-23 ENCOUNTER — Ambulatory Visit: Payer: Medicare HMO | Attending: Internal Medicine

## 2019-06-23 DIAGNOSIS — Z23 Encounter for immunization: Secondary | ICD-10-CM | POA: Insufficient documentation

## 2019-06-23 DIAGNOSIS — Z933 Colostomy status: Secondary | ICD-10-CM | POA: Diagnosis not present

## 2019-06-23 DIAGNOSIS — K56609 Unspecified intestinal obstruction, unspecified as to partial versus complete obstruction: Secondary | ICD-10-CM | POA: Diagnosis not present

## 2019-06-23 DIAGNOSIS — T8131XD Disruption of external operation (surgical) wound, not elsewhere classified, subsequent encounter: Secondary | ICD-10-CM | POA: Diagnosis not present

## 2019-06-23 NOTE — Progress Notes (Signed)
   Covid-19 Vaccination Clinic  Name:  SOHINI NAPIERALA    MRN: [id number] DOB: 01-01-1951  06/23/2019  Ms. Landenberger was observed post Covid-19 immunization for 30 minutes based on pre-vaccination screening without incidence. She was provided with Vaccine Information Sheet and instruction to access the V-Safe system.   Ms. Lullo was instructed to call 911 with any severe reactions post vaccine: Marland Kitchen Difficulty breathing  . Swelling of your face and throat  . A fast heartbeat  . A bad rash all over your body  . Dizziness and weakness    Immunizations Administered    Name Date Dose VIS Date Route   Pfizer COVID-19 Vaccine 06/23/2019  8:36 AM 0.3 mL 04/21/2019 Intramuscular   Manufacturer: [place]   Lot: [id number]   [place]: [id number]

## 2019-06-27 ENCOUNTER — Other Ambulatory Visit: Payer: Self-pay

## 2019-06-27 DIAGNOSIS — K56609 Unspecified intestinal obstruction, unspecified as to partial versus complete obstruction: Secondary | ICD-10-CM | POA: Diagnosis not present

## 2019-06-27 DIAGNOSIS — Z933 Colostomy status: Secondary | ICD-10-CM | POA: Diagnosis not present

## 2019-06-27 DIAGNOSIS — T8131XD Disruption of external operation (surgical) wound, not elsewhere classified, subsequent encounter: Secondary | ICD-10-CM | POA: Diagnosis not present

## 2019-06-28 ENCOUNTER — Ambulatory Visit (INDEPENDENT_AMBULATORY_CARE_PROVIDER_SITE_OTHER): Payer: Medicare HMO | Admitting: Family Medicine

## 2019-06-28 ENCOUNTER — Encounter: Payer: Self-pay | Admitting: Family Medicine

## 2019-06-28 VITALS — BP 150/60 | HR 82 | Temp 97.8°F | Wt 188.2 lb

## 2019-06-28 DIAGNOSIS — R69 Illness, unspecified: Secondary | ICD-10-CM | POA: Diagnosis not present

## 2019-06-28 DIAGNOSIS — F1190 Opioid use, unspecified, uncomplicated: Secondary | ICD-10-CM

## 2019-06-28 DIAGNOSIS — M5030 Other cervical disc degeneration, unspecified cervical region: Secondary | ICD-10-CM | POA: Diagnosis not present

## 2019-06-28 MED ORDER — HYDROCODONE-ACETAMINOPHEN 10-325 MG PO TABS: 1.0000 | ORAL_TABLET | Freq: Three times a day (TID) | ORAL | 0 refills | Status: DC | PRN

## 2019-06-30 ENCOUNTER — Encounter: Payer: Self-pay | Admitting: Family Medicine

## 2019-06-30 NOTE — Progress Notes (Signed)
   Subjective:    Patient ID: [name], female    DOB: 02-May-1951, 69 y.o.   MRN: [id number]  HPI Here for pain  Management. She is doing well. In fact she asks that we decrease her monthly amount of Norco to #90 instead of 120. Indication for chronic opioid: neck pain Medication and dose: Norco 10-325 # pills per month: 90 Last UDS date: 06-28-19 Opioid Treatment Agreement signed (Y/N): 06-03-18 Opioid Treatment Agreement last reviewed with patient:  06-28-19 NCCSRS reviewed this encounter (include red flags): Yes    Review of Systems     Objective:   Physical Exam        Assessment & Plan:  Pain management, meds were refilled.  Alysia Penna, MD

## 2019-07-03 DIAGNOSIS — K56609 Unspecified intestinal obstruction, unspecified as to partial versus complete obstruction: Secondary | ICD-10-CM | POA: Diagnosis not present

## 2019-07-03 DIAGNOSIS — T8131XD Disruption of external operation (surgical) wound, not elsewhere classified, subsequent encounter: Secondary | ICD-10-CM | POA: Diagnosis not present

## 2019-07-03 DIAGNOSIS — Z933 Colostomy status: Secondary | ICD-10-CM | POA: Diagnosis not present

## 2019-07-12 ENCOUNTER — Other Ambulatory Visit: Payer: Self-pay | Admitting: *Deleted

## 2019-07-12 DIAGNOSIS — Z933 Colostomy status: Secondary | ICD-10-CM | POA: Diagnosis not present

## 2019-07-12 DIAGNOSIS — K56609 Unspecified intestinal obstruction, unspecified as to partial versus complete obstruction: Secondary | ICD-10-CM | POA: Diagnosis not present

## 2019-07-12 DIAGNOSIS — T8131XD Disruption of external operation (surgical) wound, not elsewhere classified, subsequent encounter: Secondary | ICD-10-CM | POA: Diagnosis not present

## 2019-07-12 NOTE — Patient Outreach (Signed)
[place] [facility]) Care Management  07/12/2019  [name] 02-06-1951 [id number]    Telephone Assessment  RN attempted outreach however unsuccessful however able to leave a HIPAA approved voice message requesting a call back.   PLAN: Will continue outreach call accordingly and update plan of care with interventions at that time.  [name], RN Care Management Coordinator [place] Office [phone]

## 2019-07-15 ENCOUNTER — Ambulatory Visit: Payer: Medicare HMO | Attending: Internal Medicine

## 2019-07-15 DIAGNOSIS — Z23 Encounter for immunization: Secondary | ICD-10-CM | POA: Insufficient documentation

## 2019-07-15 NOTE — Progress Notes (Signed)
   Covid-19 Vaccination Clinic  Name:  [name]    MRN: [id number] DOB: January 19, 1951  07/15/2019  [name] was observed post Covid-19 immunization for 30 minutes based on pre-vaccination screening without incident. She was provided with Vaccine Information Sheet and instruction to access the V-Safe system.   [name] was instructed to call 911 with any severe reactions post vaccine: Marland Kitchen Difficulty breathing  . Swelling of face and throat  . A fast heartbeat  . A bad rash all over body  . Dizziness and weakness   Immunizations Administered    Name Date Dose VIS Date Route   Pfizer COVID-19 Vaccine 07/15/2019  2:10 PM 0.3 mL 04/21/2019 Intramuscular   Manufacturer: [place]   Lot: [id number]   [place]: [id number]

## 2019-07-20 DIAGNOSIS — K56609 Unspecified intestinal obstruction, unspecified as to partial versus complete obstruction: Secondary | ICD-10-CM | POA: Diagnosis not present

## 2019-07-20 DIAGNOSIS — Z933 Colostomy status: Secondary | ICD-10-CM | POA: Diagnosis not present

## 2019-07-20 DIAGNOSIS — T8131XD Disruption of external operation (surgical) wound, not elsewhere classified, subsequent encounter: Secondary | ICD-10-CM | POA: Diagnosis not present

## 2019-07-26 ENCOUNTER — Other Ambulatory Visit: Payer: Self-pay | Admitting: *Deleted

## 2019-07-26 NOTE — Patient Outreach (Signed)
[place] [facility]) Care Management  07/26/2019  Brooke-Lynn Trust Shira 1950/11/13 [id number]    Telephone Assessment  RN spoke with pt today and received an update on her ongoing care. Pt states she is doing well with pending appointment tomorrow with her surgeon. Reports starting back on her ob however continues to work for home. Pt reports she has received her vaccination shots with with no problems. Taking all prescribed medications with sufficient refills.  Plan of care discussed concerning prevention measures and interventions updated accordingly.  Plan: Will update primary provider on pt's disposition with [facility] services and send updated plan of care.   THN CM Care Plan Problem One     Most Recent Value  Care Plan Problem One  Prevention measures related to post-op hospitalization  Role Documenting the Problem One  Care Management Telephonic [place] for Problem One  Active  THN Long Term Goal   Pt will have no hospital readmission over the next 90 days.  THN Long Term Goal Start Date  04/26/19  Interventions for Problem One Long Term Goal  Will continue to do well with no hospitalization. WIill continiue to reiterate on preventive measues to avoid readmission or acute episodes from occurring. Will continue ot offer community resources if needed for pt's ongoing management of care. Will remind pt of other [facility] services for social worker nd pharmacy if needed.      [name], RN Care Management Coordinator [place] Office [phone]

## 2019-07-27 DIAGNOSIS — K56609 Unspecified intestinal obstruction, unspecified as to partial versus complete obstruction: Secondary | ICD-10-CM | POA: Diagnosis not present

## 2019-07-28 ENCOUNTER — Other Ambulatory Visit: Payer: Self-pay | Admitting: Surgery

## 2019-07-28 DIAGNOSIS — K56609 Unspecified intestinal obstruction, unspecified as to partial versus complete obstruction: Secondary | ICD-10-CM

## 2019-07-31 ENCOUNTER — Encounter: Payer: Self-pay | Admitting: Internal Medicine

## 2019-07-31 ENCOUNTER — Telehealth: Payer: Self-pay | Admitting: *Deleted

## 2019-07-31 NOTE — Telephone Encounter (Signed)
-----   Message from Jerene Bears, MD sent at 07/27/2019  4:27 PM EDT ----- Regarding: FW: will need colonoscopy Please arrange for a colonoscopy in the [place] via ostomy and rectum before colostomy takedown by Dr. Lucia Gaskins.Thanks JMP ----- Message ----- From: Alphonsa Overall, MD Sent: 07/27/2019  11:22 AM EDT To: Jerene Bears, MD Subject: will need colonoscopy                          Ulice Dash,  I did a sigmoid resection on Ms. Schimming in December, 2020, for an obstructing diverticular stricture.  She has an end colostomy that I plan to reverse in May or June.  You did a colonoscopy on her in 2016.  I'd appreciate it if you could colonoscope her again In the next month or two, before I reverse her colostomy.  She's expecting a call from your office.  Let me know if there is a question.  Thanks, Shanon Brow

## 2019-07-31 NOTE — Telephone Encounter (Signed)
Left message for patient to call back. She needs colonoscopy. No need for covid testing prior as she has had her COVID vaccine #1 and 2.

## 2019-08-01 NOTE — Telephone Encounter (Signed)
Patient is scheduled for colonoscopy and previsit.

## 2019-08-08 ENCOUNTER — Ambulatory Visit
Admission: RE | Admit: 2019-08-08 | Discharge: 2019-08-08 | Disposition: A | Discharge status: Home / Self Care | Payer: Medicare HMO | Source: Ambulatory Visit | Attending: Surgery | Admitting: Surgery

## 2019-08-08 DIAGNOSIS — K5730 Diverticulosis of large intestine without perforation or abscess without bleeding: Secondary | ICD-10-CM | POA: Diagnosis not present

## 2019-08-08 DIAGNOSIS — K56609 Unspecified intestinal obstruction, unspecified as to partial versus complete obstruction: Secondary | ICD-10-CM

## 2019-08-16 ENCOUNTER — Other Ambulatory Visit: Payer: Self-pay

## 2019-08-16 ENCOUNTER — Ambulatory Visit (AMBULATORY_SURGERY_CENTER): Payer: Self-pay

## 2019-08-16 VITALS — Temp 96.6°F | Ht 64.0 in | Wt 196.6 lb

## 2019-08-16 DIAGNOSIS — Z933 Colostomy status: Secondary | ICD-10-CM | POA: Diagnosis not present

## 2019-08-16 DIAGNOSIS — T8131XD Disruption of external operation (surgical) wound, not elsewhere classified, subsequent encounter: Secondary | ICD-10-CM | POA: Diagnosis not present

## 2019-08-16 DIAGNOSIS — Z1211 Encounter for screening for malignant neoplasm of colon: Secondary | ICD-10-CM

## 2019-08-16 DIAGNOSIS — K56609 Unspecified intestinal obstruction, unspecified as to partial versus complete obstruction: Secondary | ICD-10-CM | POA: Diagnosis not present

## 2019-08-16 MED ORDER — NA SULFATE-K SULFATE-MG SULF 17.5-3.13-1.6 GM/177ML PO SOLN: 1.0000 | Freq: Once | ORAL | 0 refills | Status: AC

## 2019-08-16 NOTE — Progress Notes (Signed)
No allergies to soy   Eggs makes her nauseated--states she eats them in product.  Pt is not on blood thinners or diet pills Denies issues with sedation/intubation Denies atrial flutter/fib Denies constipation   Emmi instructions given to pt  Pt is aware of Covid safety and care partner requirements.

## 2019-08-22 DIAGNOSIS — K56609 Unspecified intestinal obstruction, unspecified as to partial versus complete obstruction: Secondary | ICD-10-CM | POA: Diagnosis not present

## 2019-08-22 DIAGNOSIS — T8131XD Disruption of external operation (surgical) wound, not elsewhere classified, subsequent encounter: Secondary | ICD-10-CM | POA: Diagnosis not present

## 2019-08-22 DIAGNOSIS — Z933 Colostomy status: Secondary | ICD-10-CM | POA: Diagnosis not present

## 2019-08-23 DIAGNOSIS — T8131XD Disruption of external operation (surgical) wound, not elsewhere classified, subsequent encounter: Secondary | ICD-10-CM | POA: Diagnosis not present

## 2019-08-23 DIAGNOSIS — K56609 Unspecified intestinal obstruction, unspecified as to partial versus complete obstruction: Secondary | ICD-10-CM | POA: Diagnosis not present

## 2019-08-23 DIAGNOSIS — Z933 Colostomy status: Secondary | ICD-10-CM | POA: Diagnosis not present

## 2019-08-25 ENCOUNTER — Encounter: Payer: Self-pay | Admitting: Internal Medicine

## 2019-08-28 ENCOUNTER — Encounter: Payer: Medicare HMO | Admitting: Internal Medicine

## 2019-08-29 ENCOUNTER — Encounter: Payer: Self-pay | Admitting: Internal Medicine

## 2019-08-29 ENCOUNTER — Other Ambulatory Visit: Payer: Self-pay

## 2019-08-29 ENCOUNTER — Ambulatory Visit (AMBULATORY_SURGERY_CENTER): Payer: Medicare HMO | Admitting: Internal Medicine

## 2019-08-29 VITALS — BP 138/76 | HR 71 | Temp 97.7°F | Resp 14 | Ht 64.0 in | Wt 196.0 lb

## 2019-08-29 DIAGNOSIS — Z86010 Personal history of colonic polyps: Secondary | ICD-10-CM | POA: Diagnosis not present

## 2019-08-29 DIAGNOSIS — Z1211 Encounter for screening for malignant neoplasm of colon: Secondary | ICD-10-CM

## 2019-08-29 DIAGNOSIS — K635 Polyp of colon: Secondary | ICD-10-CM | POA: Diagnosis not present

## 2019-08-29 DIAGNOSIS — I10 Essential (primary) hypertension: Secondary | ICD-10-CM | POA: Diagnosis not present

## 2019-08-29 DIAGNOSIS — K5732 Diverticulitis of large intestine without perforation or abscess without bleeding: Secondary | ICD-10-CM | POA: Diagnosis not present

## 2019-08-29 DIAGNOSIS — D120 Benign neoplasm of cecum: Secondary | ICD-10-CM

## 2019-08-29 HISTORY — PX: COLONOSCOPY: SHX174

## 2019-08-29 MED ORDER — SODIUM CHLORIDE 0.9 % IV SOLN: 500.0000 mL | Freq: Once | INTRAVENOUS | Status: DC

## 2019-08-29 NOTE — Progress Notes (Signed)
Report given to PACU, vss 

## 2019-08-29 NOTE — Patient Instructions (Signed)
Discharge instructions given. Handouts on polyps and diverticulosis. Resume previous medications. YOU HAD AN ENDOSCOPIC PROCEDURE TODAY AT THE [facility]:   Refer to the procedure report that was given to you for any specific questions about what was found during the examination.  If the procedure report does not answer your questions, please call your gastroenterologist to clarify.  If you requested that your care partner not be given the details of your procedure findings, then the procedure report has been included in a sealed envelope for you to review at your convenience later.  YOU SHOULD EXPECT: Some feelings of bloating in the abdomen. Passage of more gas than usual.  Walking can help get rid of the air that was put into your GI tract during the procedure and reduce the bloating. If you had a lower endoscopy (such as a colonoscopy or flexible sigmoidoscopy) you may notice spotting of blood in your stool or on the toilet paper. If you underwent a bowel prep for your procedure, you may not have a normal bowel movement for a few days.  Please Note:  You might notice some irritation and congestion in your nose or some drainage.  This is from the oxygen used during your procedure.  There is no need for concern and it should clear up in a [place] or so.  SYMPTOMS TO REPORT IMMEDIATELY:   Following lower endoscopy (colonoscopy or flexible sigmoidoscopy):  Excessive amounts of blood in the stool  Significant tenderness or worsening of abdominal pains  Swelling of the abdomen that is new, acute  Fever of 100F or higher   For urgent or emergent issues, a gastroenterologist can be reached at any hour by calling [phone]. Do not use MyChart messaging for urgent concerns.    DIET:  We do recommend a small meal at first, but then you may proceed to your regular diet.  Drink plenty of fluids but you should avoid alcoholic beverages for 24 hours.  ACTIVITY:  You should plan to take  it easy for the rest of today and you should NOT DRIVE or use heavy machinery until tomorrow (because of the sedation medicines used during the test).    FOLLOW UP: Our staff will call the number listed on your records 48-72 hours following your procedure to check on you and address any questions or concerns that you may have regarding the information given to you following your procedure. If we do not reach you, we will leave a message.  We will attempt to reach you two times.  During this call, we will ask if you have developed any symptoms of COVID 19. If you develop any symptoms (ie: fever, flu-like symptoms, shortness of breath, cough etc.) before then, please call [phone].  If you test positive for Covid 19 in the 2 weeks post procedure, please call and report this information to us.    If any biopsies were taken you will be contacted by phone or by letter within the next 1-3 weeks.  Please call us at [phone] if you have not heard about the biopsies in 3 weeks.    SIGNATURES/CONFIDENTIALITY: You and/or your care partner have signed paperwork which will be entered into your electronic medical record.  These signatures attest to the fact that that the information above on your After Visit Summary has been reviewed and is understood.  Full responsibility of the confidentiality of this discharge information lies with you and/or your care-partner. 

## 2019-08-29 NOTE — Progress Notes (Signed)
Called to room to assist during endoscopic procedure.  Patient ID and intended procedure confirmed with present staff. Received instructions for my participation in the procedure from the performing physician.  

## 2019-08-29 NOTE — Op Note (Addendum)
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                            Lucia Gaskins Medicines:                Propofol per Anesthesia Procedure:                Pre-Anesthesia Assessment:                           - Prior to the procedure, a History and Physical                            was performed, and patient medications and                            allergies were reviewed. The patient's tolerance of                            previous anesthesia was also reviewed. The risks                            and benefits of the procedure and the sedation                            options and risks were discussed with the patient.                            All questions were answered, and informed consent                            was obtained. Prior Anticoagulants: The patient has                            taken no previous anticoagulant or antiplatelet  agents. ASA Grade Assessment: II - A patient with                            mild systemic disease. After reviewing the risks                            and benefits, the patient was deemed in                            satisfactory condition to undergo the procedure.                           After obtaining informed consent, the colonoscope                            was passed under direct vision. Throughout the         procedure, the patient's blood pressure, pulse, and                            oxygen saturations were monitored continuously. The                            Colonoscope was introduced through the sigmoid                            colostomy and advanced to the cecum, identified by                            appendiceal orifice and ileocecal valve.                            Colonoscope was also advanced through the anus to                            the rectosigmoid colon. The colonoscopy was                            performed without difficulty. The patient tolerated                            the procedure well. The quality of the bowel                            preparation was good. The ileocecal valve,                            appendiceal orifice, and rectum were photographed. Scope In: 3:48:11 PM Scope Out: 4:07:44 PM Scope Withdrawal Time: 0 hours 10 minutes 24 seconds  Total Procedure Duration: 0 hours 19 minutes 33 seconds  Findings:                 The digital rectal exam was normal.  Normal, though slightly pale mucosa with adherent                            mucus was found in the rectum and in the                            recto-sigmoid colon.                           There was evidence of a patent end colostomy in the                            proximal sigmoid colon. This was characterized by                            healthy appearing mucosa.                           A 3 mm polyp was found in the cecum. The polyp was                            sessile. The polyp was removed with a cold biopsy                            forceps. Resection and retrieval were complete.                           A few small-mouthed diverticula were found in the                            proximal sigmoid colon and distal descending colon.                           The exam was otherwise without abnormality. Complications:            No immediate  complications. Estimated Blood Loss:     Estimated blood loss was minimal. Impression:               - Pale mucosa with adherent mucus in the rectum and                            in the recto-sigmoid colon. No polypoid or mass                            lesions in the rectum/rectal stump.                           - Patent end colostomy with healthy appearing                            mucosa in the proximal sigmoid colon.                           - One 3 mm polyp in the cecum, removed  with a cold                            biopsy forceps. Resected and retrieved.                           - Diverticulosis in the proximal sigmoid colon and                            in the distal descending colon.                           - The examination was otherwise normal. Recommendation:           - Patient has a contact number available for                            emergencies. The signs and symptoms of potential                            delayed complications were discussed with the                            patient. Return to normal activities tomorrow.                            Written discharge instructions were provided to the                            patient.                           - Resume previous diet.                           - Continue present medications.                           - Await pathology results.                           - Repeat colonoscopy is recommended for                            surveillance. The colonoscopy date will be                            determined after pathology results from today's                            exam become available for review. Jerene Bears, MD 08/29/2019 4:17:10 PM This report has been signed electronically.

## 2019-08-29 NOTE — Progress Notes (Signed)
Pt's states no medical or surgical changes since previsit or office visit. 

## 2019-08-29 NOTE — Progress Notes (Signed)
DT- vitals LC- temp

## 2019-08-31 ENCOUNTER — Telehealth: Payer: Self-pay | Admitting: *Deleted

## 2019-08-31 NOTE — Telephone Encounter (Signed)
  Follow up Call-  Call back number 08/29/2019  Post procedure Call Back phone  # [phone]  Permission to leave phone message Yes  Some recent data might be hidden     Patient questions:  Do you have a fever, pain , or abdominal swelling? No. Pain Score  0 *  Have you tolerated food without any problems? Yes.    Have you been able to return to your normal activities? Yes.    Do you have any questions about your discharge instructions: Diet   No. Medications  No. Follow up visit  No.  Do you have questions or concerns about your Care? No.  Actions: * If pain score is 4 or above: No action needed, pain <4.  1. Have you developed a fever since your procedure? no  2.   Have you had an respiratory symptoms (SOB or cough) since your procedure? no  3.   Have you tested positive for COVID 19 since your procedure no  4.   Have you had any family members/close contacts diagnosed with the COVID 19 since your procedure?  no   If yes to any of these questions please route to Joylene John, RN and Erenest Rasher, RN

## 2019-09-04 ENCOUNTER — Encounter: Payer: Self-pay | Admitting: Internal Medicine

## 2019-09-11 DIAGNOSIS — K56609 Unspecified intestinal obstruction, unspecified as to partial versus complete obstruction: Secondary | ICD-10-CM | POA: Diagnosis not present

## 2019-09-11 DIAGNOSIS — Z933 Colostomy status: Secondary | ICD-10-CM | POA: Diagnosis not present

## 2019-09-11 DIAGNOSIS — T8131XD Disruption of external operation (surgical) wound, not elsewhere classified, subsequent encounter: Secondary | ICD-10-CM | POA: Diagnosis not present

## 2019-09-20 ENCOUNTER — Encounter: Payer: Self-pay | Admitting: Family Medicine

## 2019-09-20 ENCOUNTER — Other Ambulatory Visit (HOSPITAL_COMMUNITY): Payer: Self-pay | Admitting: Surgery

## 2019-09-20 ENCOUNTER — Ambulatory Visit (INDEPENDENT_AMBULATORY_CARE_PROVIDER_SITE_OTHER): Payer: Medicare HMO | Admitting: Family Medicine

## 2019-09-20 ENCOUNTER — Other Ambulatory Visit: Payer: Self-pay

## 2019-09-20 VITALS — BP 140/80 | HR 79 | Temp 97.6°F | Wt 193.4 lb

## 2019-09-20 DIAGNOSIS — F1190 Opioid use, unspecified, uncomplicated: Secondary | ICD-10-CM

## 2019-09-20 DIAGNOSIS — K56609 Unspecified intestinal obstruction, unspecified as to partial versus complete obstruction: Secondary | ICD-10-CM | POA: Diagnosis not present

## 2019-09-20 DIAGNOSIS — R69 Illness, unspecified: Secondary | ICD-10-CM | POA: Diagnosis not present

## 2019-09-20 DIAGNOSIS — M5030 Other cervical disc degeneration, unspecified cervical region: Secondary | ICD-10-CM

## 2019-09-20 MED ORDER — GENTAMICIN SULFATE 40 MG/ML IJ SOLN: 5.0000 mg/kg | INTRAVENOUS | Status: DC

## 2019-09-20 MED ORDER — HYDROCODONE-ACETAMINOPHEN 10-325 MG PO TABS: 1.0000 | ORAL_TABLET | Freq: Three times a day (TID) | ORAL | 0 refills | Status: DC | PRN

## 2019-09-20 MED ORDER — CLINDAMYCIN PHOSPHATE 900 MG/50ML IV SOLN: 900.0000 mg | INTRAVENOUS | Status: DC

## 2019-09-20 NOTE — Progress Notes (Signed)
   Subjective:    Patient ID: [name], female    DOB: 1950/05/13, 69 y.o.   MRN: [id number]  HPI Here for pain management. She is doing well.  Indication for chronic opioid: neck pain Medication and dose: Norco 10-325 # pills per month: 90 Last UDS date: 09-20-19 Opioid Treatment Agreement signed (Y/N): 06-03-18 Opioid Treatment Agreement last reviewed with patient:  09-20-19 NCCSRS reviewed this encounter (include red flags): Yes   Review of Systems     Objective:   Physical Exam        Assessment & Plan:  Pain management, meds were refilled.  Alysia Penna, MD

## 2019-09-22 LAB — DRUG MONITORING, PANEL 8 WITH CONFIRMATION, URINE
6 Acetylmorphine: NEGATIVE ng/mL (ref <10)
Alcohol Metabolites: NEGATIVE ng/mL
Amphetamines: NEGATIVE ng/mL (ref <500)
Benzodiazepines: NEGATIVE ng/mL (ref <100)
Buprenorphine, Urine: NEGATIVE ng/mL (ref <5)
Cocaine Metabolite: NEGATIVE ng/mL (ref <150)
Codeine: NEGATIVE ng/mL (ref <50)
Creatinine: 186.8 mg/dL
Hydrocodone: NEGATIVE ng/mL (ref <50)
Hydromorphone: NEGATIVE ng/mL (ref <50)
MDMA: NEGATIVE ng/mL (ref <500)
Marijuana Metabolite: NEGATIVE ng/mL (ref <20)
Morphine: NEGATIVE ng/mL (ref <50)
Norhydrocodone: 61 ng/mL — ABNORMAL HIGH (ref <50)
Opiates: POSITIVE ng/mL — AB (ref <100)
Oxidant: NEGATIVE ug/mL
Oxycodone: NEGATIVE ng/mL (ref <100)
pH: 5.8 (ref 4.5–9.0)

## 2019-09-22 LAB — DM TEMPLATE

## 2019-09-25 DIAGNOSIS — Z933 Colostomy status: Secondary | ICD-10-CM | POA: Diagnosis not present

## 2019-09-25 DIAGNOSIS — T8131XD Disruption of external operation (surgical) wound, not elsewhere classified, subsequent encounter: Secondary | ICD-10-CM | POA: Diagnosis not present

## 2019-09-25 DIAGNOSIS — K56609 Unspecified intestinal obstruction, unspecified as to partial versus complete obstruction: Secondary | ICD-10-CM | POA: Diagnosis not present

## 2019-10-25 ENCOUNTER — Other Ambulatory Visit: Payer: Self-pay | Admitting: *Deleted

## 2019-10-25 DIAGNOSIS — K56609 Unspecified intestinal obstruction, unspecified as to partial versus complete obstruction: Secondary | ICD-10-CM | POA: Diagnosis not present

## 2019-10-25 DIAGNOSIS — Z933 Colostomy status: Secondary | ICD-10-CM | POA: Diagnosis not present

## 2019-10-25 DIAGNOSIS — T8131XD Disruption of external operation (surgical) wound, not elsewhere classified, subsequent encounter: Secondary | ICD-10-CM | POA: Diagnosis not present

## 2019-10-25 NOTE — Patient Outreach (Signed)
[facility]) Care Management  10/25/2019  [name] 09/01/1950 [id number]   Telephone Assessment-Unsuccessful  RN attempted outreach call however unsuccessful. RN able to leave a HIPAA approved voice message requesting a call back.   Plan: Will plan another outreach call next week for ongoing [facility] services.   [name], RN Care Management Coordinator [place] Office [phone]

## 2019-11-03 ENCOUNTER — Other Ambulatory Visit: Payer: Self-pay | Admitting: *Deleted

## 2019-11-03 ENCOUNTER — Encounter: Payer: Self-pay | Admitting: *Deleted

## 2019-11-03 NOTE — Patient Outreach (Addendum)
[facility] [facility]) Care Management  11/03/2019  [name] [birth year] [id number]   Telephone Assessment-Successful-Prevention Measures  RN spoke with pt today and updated the current plan of care. Discussed hospital prevention and ongoing safety measures as pt reports upcoming surgery but not sure if she will be admitted. Based upon the pt's plan of care and unaware if this planned procedure will require an admission. RN will continue services throughout next month based upon pt's upcoming surgery.   Plan of care updated based upon this delay. Will update pt's provider on the quarterly review and change acuity based upon pt's surgery. Will follow up next month.  THN CM Care Plan Problem One     Most Recent Value  Care Plan Problem One Prevention measures related to post-op hospitalization  Role Documenting the Problem One Care Management Telephonic [place] for Problem One Active  THN Long Term Goal  Pt will have no hospital readmission over the next 90 days.  THN Long Term Goal Start Date 04/26/19  Interventions for Problem One Long Term Goal Will extend based upon pending planned surgery over the next month. Will reiterateon hospital admission prevention however if her provider recommends hospitalization will follow up post op discharge.  Pt aware of signs or symptoms that would requrire her provider's input and when to call to prevent acute symptoms from occuring.        [name], RN Care Management Coordinator [place] Office [phone]

## 2019-11-10 DIAGNOSIS — Z933 Colostomy status: Secondary | ICD-10-CM | POA: Diagnosis not present

## 2019-11-10 DIAGNOSIS — T8131XD Disruption of external operation (surgical) wound, not elsewhere classified, subsequent encounter: Secondary | ICD-10-CM | POA: Diagnosis not present

## 2019-11-10 DIAGNOSIS — K56609 Unspecified intestinal obstruction, unspecified as to partial versus complete obstruction: Secondary | ICD-10-CM | POA: Diagnosis not present

## 2019-11-23 ENCOUNTER — Ambulatory Visit (INDEPENDENT_AMBULATORY_CARE_PROVIDER_SITE_OTHER): Payer: Medicare HMO | Admitting: Family Medicine

## 2019-11-23 ENCOUNTER — Encounter: Payer: Self-pay | Admitting: Family Medicine

## 2019-11-23 ENCOUNTER — Other Ambulatory Visit: Payer: Self-pay

## 2019-11-23 VITALS — BP 130/64 | HR 94 | Temp 99.8°F | Wt 193.8 lb

## 2019-11-23 DIAGNOSIS — M5030 Other cervical disc degeneration, unspecified cervical region: Secondary | ICD-10-CM | POA: Diagnosis not present

## 2019-11-23 DIAGNOSIS — F1190 Opioid use, unspecified, uncomplicated: Secondary | ICD-10-CM

## 2019-11-23 DIAGNOSIS — R69 Illness, unspecified: Secondary | ICD-10-CM | POA: Diagnosis not present

## 2019-11-23 MED ORDER — HYDROCODONE-ACETAMINOPHEN 10-325 MG PO TABS: 1.0000 | ORAL_TABLET | Freq: Three times a day (TID) | ORAL | 0 refills | Status: DC | PRN

## 2019-11-23 NOTE — Progress Notes (Signed)
   Subjective:    Patient ID: [name], female    DOB: 25-Aug-1950, 69 y.o.   MRN: [id number]  HPI Here for pain management, she is doing well.  Indication for chronic opioid: neck pain  Medication and dose: Norco 10-325 # pills per month: 90 Last UDS date: 09-20-19 Opioid Treatment Agreement signed (Y/N): 06-03-18 Opioid Treatment Agreement last reviewed with patient:  11-23-19 NCCSRS reviewed this encounter (include red flags): Yes    Review of Systems     Objective:   Physical Exam        Assessment & Plan:  Pain management, meds were refilled.  Alysia Penna, MD

## 2019-11-28 NOTE — Patient Instructions (Addendum)
DUE TO COVID-19 ONLY ONE VISITOR IS ALLOWED TO COME WITH YOU AND STAY IN THE WAITING ROOM ONLY DURING PRE OP AND PROCEDURE [name]. THE 2 VISITORS  MAY VISIT WITH YOU AFTER [name] IN YOUR PRIVATE ROOM DURING VISITING HOURS ONLY!  YOU NEED TO HAVE A COVID 19 TEST ON  7/26_______ @_9 :00______, THIS TEST MUST BE DONE BEFORE [name], COME  [address], [place] , [address].  ([place]) ONCE YOUR COVID TEST IS COMPLETED, PLEASE BEGIN THE QUARANTINE INSTRUCTIONS AS OUTLINED IN YOUR HANDOUT.  [name] will be canceled if no covid test is done.                [name]    Your procedure is scheduled on: 12/07/19   Report to [facility] Main  Entrance   Report to Short Stay at 5:30 AM     Call this number if you have problems the morning of [name] [phone]   . BRUSH YOUR TEETH MORNING OF [name] AND RINSE YOUR MOUTH OUT, NO CHEWING GUM CANDY OR MINTS.   DRINK 2 PRESURGERY ENSURE DRINKS THE NIGHT BEFORE [name] AT 10:00 PM    NO SOLIDS AFTER MIDNIGHT THE [place] PRIOR TO THE [name].   NOTHING BY MOUTH EXCEPT CLEAR LIQUIDS UNTIL THREE HOURS PRIOR TO SCHEDULED [name].   PLEASE FINISH PRESURGERY ENSURE DRINK PER SURGEON ORDER 3 HOURS PRIOR TO SCHEDULED [name] TIME WHICH NEEDS TO BE COMPLETED AT __4:30_______.    Take these medicines the morning of [name] with A SIP OF WATER: ,, Metoprolol, Oxybutynin, use your inhalers and bring with you to the hospital, Bring ostomy supplies                                 You may not have any metal on your body including hair pins and              piercings  Do not wear jewelry, make-up, lotions, powders or perfumes, deodorant             Do not wear nail polish on your fingernails.  Do not shave  48 hours prior to [name].               Do not bring valuables to the hospital. [place].  Contacts, dentures or bridgework may not be worn into  [name].                  Please read over the following fact sheets you were given: _____________________________________________________________________             [facility] - Preparing for [name] Before [name], you can play an important role.   Because skin is not sterile, your skin needs to be as free of germs as possible .  You can reduce the number of germs on your skin by washing with CHG (chlorahexidine gluconate) soap before [name].   CHG is an antiseptic cleaner which kills germs and bonds with the skin to continue killing germs even after washing. Please DO NOT use if you have an allergy to CHG or antibacterial soaps.   If your skin becomes reddened/irritated stop using the CHG and inform your nurse when you arrive at Short Stay. Do not shave (including legs and underarms) for at least 48  hours prior to the first CHG shower.   Please follow these instructions carefully:  1.  Shower with CHG Soap the night before [name] and the  morning of [name].  2.  If you choose to wash your hair, wash your hair first as usual with your  normal  shampoo.  3.  After you shampoo, rinse your hair and body thoroughly to remove the  shampoo.                                        4.  Use CHG as you would any other liquid soap.  You can apply chg directly  to the skin and wash                       Gently with a scrungie or clean washcloth.  5.  Apply the CHG Soap to your body ONLY FROM THE NECK DOWN.   Do not use on face/ open                           Wound or open sores. Avoid contact with eyes, ears mouth and genitals (private parts).                       Wash face,  Genitals (private parts) with your normal soap.             6.  Wash thoroughly, paying special attention to the area where your [name]  will be performed.  7.  Thoroughly rinse your body with warm water from the neck down.  8.  DO NOT shower/wash with your normal soap after using and rinsing off  the CHG Soap.              9.  Pat yourself dry with a clean towel.            10.  Wear clean pajamas.            11.  Place clean sheets on your bed the night of your first shower and do not  sleep with pets. [name] : Do not apply any lotions/deodorants the morning of [name].  Please wear clean clothes to the hospital/[name] center.  FAILURE TO FOLLOW THESE INSTRUCTIONS MAY RESULT IN THE CANCELLATION OF YOUR [name] PATIENT SIGNATURE_________________________________  NURSE SIGNATURE__________________________________  ________________________________________________________________________   [name]  An incentive spirometer is a tool that can help keep your lungs clear and active. This tool measures how well you are filling your lungs with each breath. Taking long deep breaths may help reverse or decrease the chance of developing breathing (pulmonary) problems (especially infection) following:  A long period of time when you are unable to move or be active. BEFORE THE PROCEDURE   If the spirometer includes an indicator to show your best effort, your nurse or respiratory therapist will set it to a desired goal.  If possible, sit up straight or lean slightly forward. Try not to slouch.  Hold the incentive spirometer in an upright position. INSTRUCTIONS FOR USE  1. Sit on the edge of your bed if possible, or sit up as far as you can in bed or on a chair. 2. Hold the incentive spirometer in an upright position. 3. Breathe out normally. 4. Place the mouthpiece in your mouth and seal your  lips tightly around it. 5. Breathe in slowly and as deeply as possible, raising the piston or the ball toward the top of the column. 6. Hold your breath for 3-5 seconds or for as long as possible. Allow the piston or ball to fall to the bottom of the column. 7. Remove the mouthpiece from your mouth and breathe out normally. 8. Rest for a few seconds and repeat Steps 1 through 7 at least 10 times every  1-2 hours when you are awake. Take your time and take a few normal breaths between deep breaths. 9. The spirometer may include an indicator to show your best effort. Use the indicator as a goal to work toward during each repetition. 10. After each set of 10 deep breaths, practice coughing to be sure your lungs are clear. If you have an incision (the cut made at the time of [name]), support your incision when coughing by placing a pillow or rolled up towels firmly against it. Once you are able to get out of bed, walk around indoors and cough well. You may stop using the incentive spirometer when instructed by your caregiver.  RISKS AND COMPLICATIONS  Take your time so you do not get dizzy or light-headed.  If you are in pain, you may need to take or ask for pain medication before doing incentive spirometry. It is harder to take a deep breath if you are having pain. AFTER USE  Rest and breathe slowly and easily.  It can be helpful to keep track of a log of your progress. Your caregiver can provide you with a simple table to help with this. If you are using the spirometer at home, follow these instructions: [place] IF:   You are having difficultly using the spirometer.  You have trouble using the spirometer as often as instructed.  Your pain medication is not giving enough relief while using the spirometer.  You develop fever of 100.5 F (38.1 C) or higher. SEEK IMMEDIATE MEDICAL CARE IF:   You cough up bloody sputum that had not been present before.  You develop fever of 102 F (38.9 C) or greater.  You develop worsening pain at or near the incision site. MAKE SURE YOU:   Understand these instructions.  Will watch your condition.  Will get help right away if you are not doing well or get worse. Document Released: 09/07/2006 Document Revised: 07/20/2011 Document Reviewed: 11/08/2006 [facility] Patient Information 2014 [place],  [place].   ________________________________________________________________________

## 2019-11-30 ENCOUNTER — Other Ambulatory Visit: Payer: Self-pay

## 2019-11-30 ENCOUNTER — Encounter (HOSPITAL_COMMUNITY)
Admission: RE | Admit: 2019-11-30 | Discharge: 2019-11-30 | Disposition: A | Discharge status: Home / Self Care | Payer: Medicare HMO | Source: Ambulatory Visit | Attending: Surgery | Admitting: Surgery

## 2019-11-30 ENCOUNTER — Encounter (HOSPITAL_COMMUNITY): Payer: Self-pay

## 2019-11-30 DIAGNOSIS — Z01812 Encounter for preprocedural laboratory examination: Secondary | ICD-10-CM | POA: Diagnosis not present

## 2019-11-30 HISTORY — DX: Dyspnea, unspecified: R06.00

## 2019-11-30 HISTORY — DX: Myoneural disorder, unspecified: G70.9

## 2019-11-30 LAB — CBC WITH DIFFERENTIAL/PLATELET
Abs Immature Granulocytes: 0.05 10*3/uL (ref 0.00–0.07)
Basophils Absolute: 0 10*3/uL (ref 0.0–0.1)
Basophils Relative: 0 %
Eosinophils Absolute: 0.2 10*3/uL (ref 0.0–0.5)
Eosinophils Relative: 1 %
HCT: 40.9 % (ref 36.0–46.0)
Hemoglobin: 12.7 g/dL (ref 12.0–15.0)
Immature Granulocytes: 0 %
Lymphocytes Relative: 23 %
Lymphs Abs: 3.1 10*3/uL (ref 0.7–4.0)
MCH: 26.5 pg (ref 26.0–34.0)
MCHC: 31.1 g/dL (ref 30.0–36.0)
MCV: 85.4 fL (ref 80.0–100.0)
Monocytes Absolute: 0.9 10*3/uL (ref 0.1–1.0)
Monocytes Relative: 7 %
Neutro Abs: 9.2 10*3/uL — ABNORMAL HIGH (ref 1.7–7.7)
Neutrophils Relative %: 69 %
Platelets: 355 10*3/uL (ref 150–400)
RBC: 4.79 MIL/uL (ref 3.87–5.11)
RDW: 14.6 % (ref 11.5–15.5)
WBC: 13.4 10*3/uL — ABNORMAL HIGH (ref 4.0–10.5)
nRBC: 0 % (ref 0.0–0.2)

## 2019-11-30 LAB — HEMOGLOBIN A1C
Hgb A1c MFr Bld: 6.5 % — ABNORMAL HIGH (ref 4.8–5.6)
Mean Plasma Glucose: 139.85 mg/dL

## 2019-11-30 LAB — COMPREHENSIVE METABOLIC PANEL
ALT: 17 U/L (ref 0–44)
AST: 15 U/L (ref 15–41)
Albumin: 3.5 g/dL (ref 3.5–5.0)
Alkaline Phosphatase: 94 U/L (ref 38–126)
Anion gap: 13 (ref 5–15)
BUN: 17 mg/dL (ref 8–23)
CO2: 25 mmol/L (ref 22–32)
Calcium: 8.8 mg/dL — ABNORMAL LOW (ref 8.9–10.3)
Chloride: 100 mmol/L (ref 98–111)
Creatinine, Ser: 0.77 mg/dL (ref 0.44–1.00)
GFR calc Af Amer: >60 mL/min (ref >60)
GFR calc non Af Amer: >60 mL/min (ref >60)
Glucose, Bld: 175 mg/dL — ABNORMAL HIGH (ref 70–99)
Potassium: 4.1 mmol/L (ref 3.5–5.1)
Sodium: 138 mmol/L (ref 135–145)
Total Bilirubin: 0.5 mg/dL (ref 0.3–1.2)
Total Protein: 7.4 g/dL (ref 6.5–8.1)

## 2019-11-30 NOTE — Progress Notes (Signed)
COVID Vaccine Completed:yes Date COVID Vaccine completed:07/15/19 COVID vaccine manufacturer: [employer]     PCP - Dr. Barbie Banner Cardiologist -   Chest x-ray - no EKG - 04/12/20 Stress Test - no ECHO - no Cardiac Cath - no  Sleep Study - no CPAP -   Fasting Blood Sugar - NA Checks Blood Sugar _____ times a [place]  Blood Thinner Instructions:NA Aspirin Instructions: Last Dose:  Anesthesia review:   Patient denies shortness of breath, fever, cough and chest pain at PAT appointment  yes   Patient verbalized understanding of instructions that were given to them at the PAT appointment. Patient was also instructed that they will need to review over the PAT instructions again at home before surgery. Yes  Pt used an inhaler this spring for allergies and she hasn't needed it in a few months.She can climb 1.5 flights of stairs before getting a little winded. She has no SOB with housework or ADLs.

## 2019-12-01 ENCOUNTER — Other Ambulatory Visit: Payer: Self-pay | Admitting: *Deleted

## 2019-12-01 NOTE — Patient Outreach (Signed)
[facility]) Care Management  12/01/2019  [name] January 23, 1951 [id number]   Assessment Telephone-Unsuccessful  RN attempted outreach call however unsuccessful. RN able to leave  HIPAA approved voice message requesting a call back.  Plan: Will follow up next week with plans to discharge it pt continue to do weill with no acute issues or needs.  [name], RN Care Management Coordinator [place] Office [phone]

## 2019-12-04 ENCOUNTER — Other Ambulatory Visit (HOSPITAL_COMMUNITY)
Admission: RE | Admit: 2019-12-04 | Discharge: 2019-12-04 | Disposition: A | Discharge status: Home / Self Care | Payer: Medicare HMO | Source: Ambulatory Visit | Attending: Surgery | Admitting: Surgery

## 2019-12-04 DIAGNOSIS — Z20822 Contact with and (suspected) exposure to covid-19: Secondary | ICD-10-CM | POA: Insufficient documentation

## 2019-12-04 DIAGNOSIS — Z01812 Encounter for preprocedural laboratory examination: Secondary | ICD-10-CM | POA: Insufficient documentation

## 2019-12-04 LAB — SARS CORONAVIRUS 2 (TAT 6-24 HRS): SARS Coronavirus 2: NEGATIVE

## 2019-12-06 IMAGING — DX DG CHEST 1V PORT
1 series · 1 of 1 positions shown · non-contrast
Comparison: CT of the abdomen and pelvis 04/13/2019

CLINICAL DATA: Preop for abdominal surgery. Large bowel
obstruction.

EXAM:
PORTABLE CHEST 1 VIEW

[chest ap]
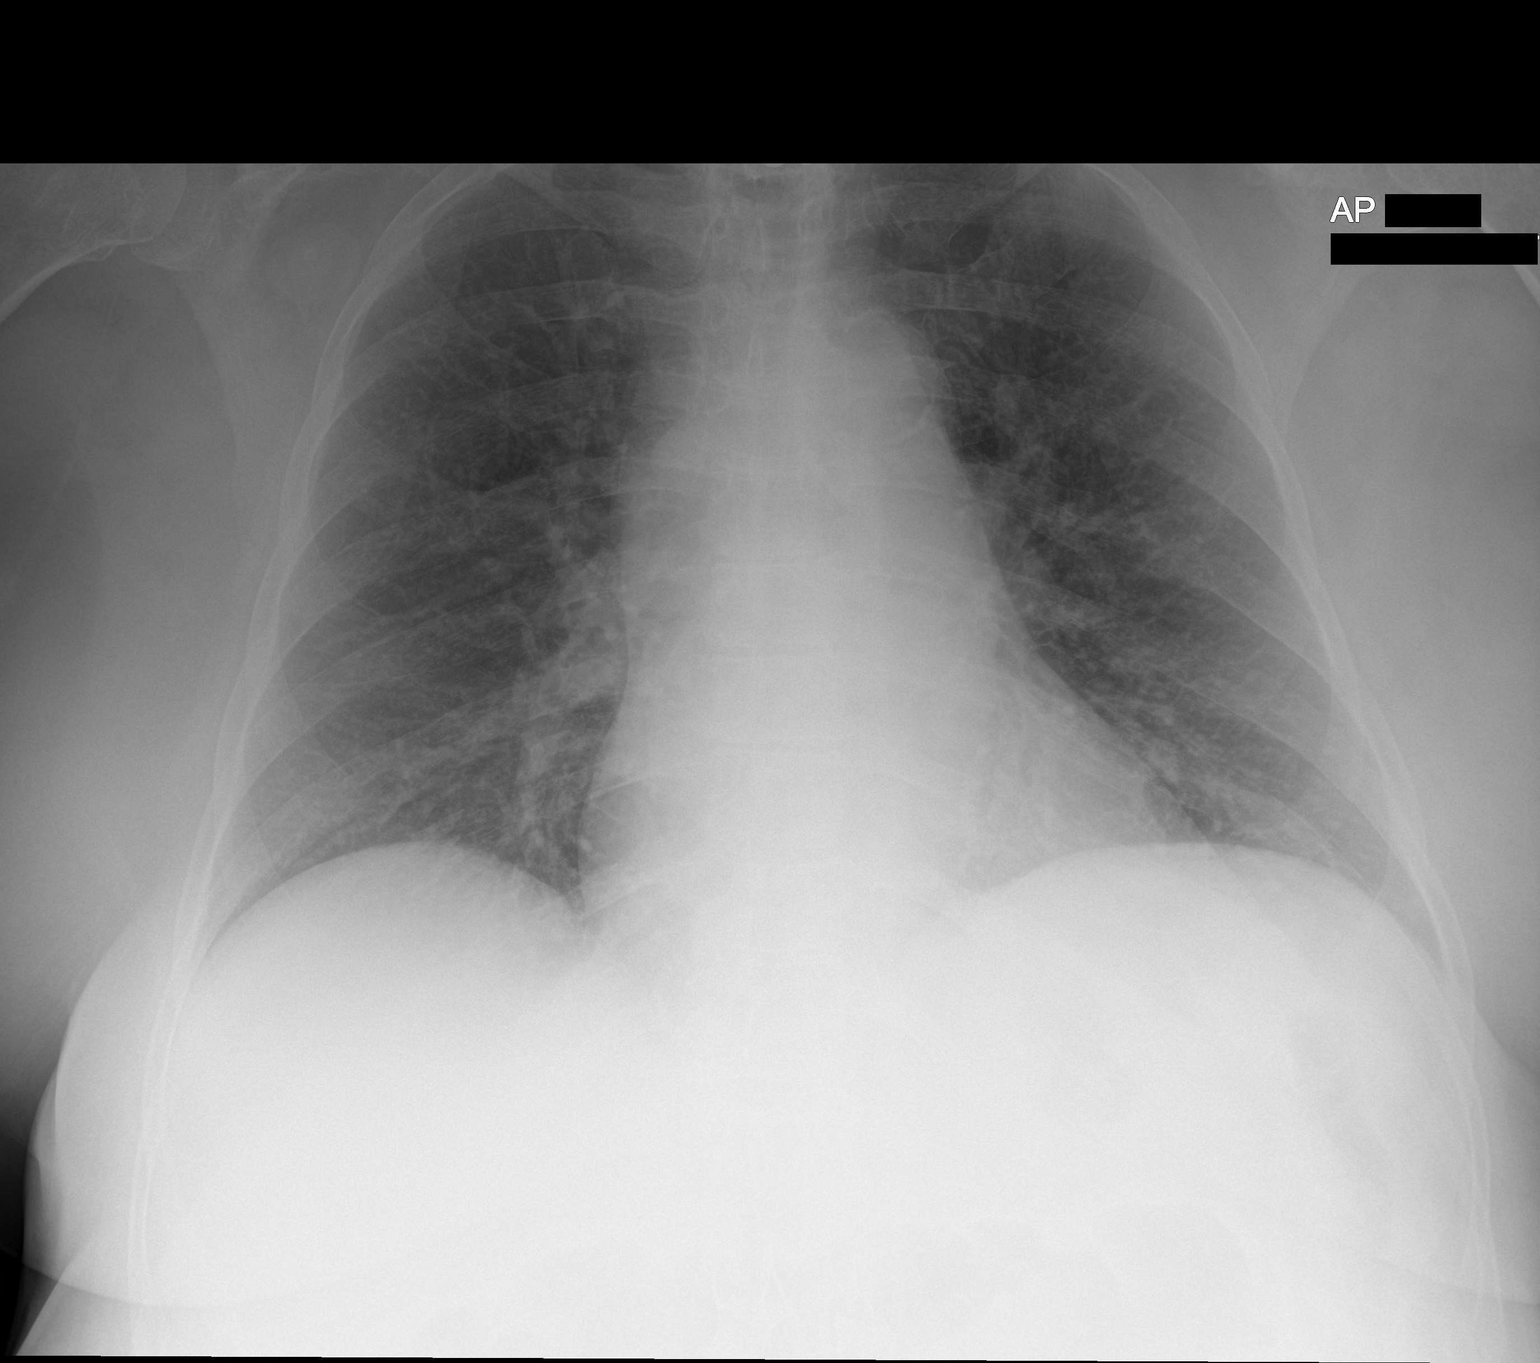

[1 of 1 positions shown; findings below may reference images not displayed]

FINDINGS: The heart size is exaggerate by low lung volumes. No edema or
effusion is present. Surgical changes are noted in the lower
cervical spine.
IMPRESSION: 1. Low lung volumes.
2. No acute cardiopulmonary disease.

## 2019-12-06 NOTE — Anesthesia Preprocedure Evaluation (Addendum)
Anesthesia Evaluation  Patient identified by MRN, date of birth, ID band Patient awake    Reviewed: Allergy & Precautions, NPO status , Patient's Chart, lab work & pertinent test results, reviewed documented beta blocker date and time   Airway Mallampati: I  TM Distance: >3 FB Neck ROM: Full    Dental  (+) Dental Advisory Given, Edentulous Upper, Edentulous Lower   Pulmonary Patient abstained from smoking., former smoker,    Pulmonary exam normal breath sounds clear to auscultation       Cardiovascular hypertension, Pt. on home beta blockers and Pt. on medications Normal cardiovascular exam Rhythm:Regular Rate:Normal     Neuro/Psych  Headaches,  Neuromuscular disease    GI/Hepatic Neg liver ROS, end colostomy   Endo/Other  Obesity   Renal/GU negative Renal ROS     Musculoskeletal  (+) Arthritis ,   Abdominal   Peds  Hematology negative hematology ROS (+)   Anesthesia Other Findings   Reproductive/Obstetrics                            Anesthesia Physical Anesthesia Plan  ASA: II  Anesthesia Plan: General   Post-op Pain Management:    Induction: Intravenous  PONV Risk Score and Plan: 4 or greater and Midazolam, Propofol infusion, Dexamethasone and Ondansetron  Airway Management Planned: Oral ETT  Additional Equipment:   Intra-op Plan:   Post-operative Plan: Extubation in OR  Informed Consent: I have reviewed the patients History and Physical, chart, labs and discussed the procedure including the risks, benefits and alternatives for the proposed anesthesia with the patient or authorized representative who has indicated his/her understanding and acceptance.     Dental advisory given  Plan Discussed with: CRNA  Anesthesia Plan Comments:        Anesthesia Quick Evaluation

## 2019-12-06 NOTE — H&P (Signed)
[name]  Location: [facility] Patient #: [id number] DOB: December 01, 1950 Married / Language: English / Race: White Female  History of Present Illness   The patient is a 69 year old female who presents with a complaint of colostomy.  The PCP is Dr. Barbie [name]  She comes by herself.  [The Covid-19 virus has disrupted normal medical care in [place] and across the nation. We have sometimes had to alter normal surgical/medical care to limit this epidemic and we have explained these changes to the patient.]  [name] underwent a sigmoid colectomy, end ostomy, and SB resection on 04/13/2020 for an high grade obstruction of her distal sigmoid colon. The final pathology proved to be diverticular disease [name], Regina Lines, MD sent to Regina Overall, MD - 09/05/2019 Colonoscopy went well. One polyp was not precancerous. A bit of diversion changes in rectum, but she should be ready for reanastomosis when you are. Thanks, [name] BE on 08/08/2019 - looks okay. Short rectal stump. She is ready to have her colostomy reversed. I went over in detail the operation and postop recovery. The main complications include bleeding, infection, leak from the bowel, and some other potential injury. She'll do the preoperative mechanical and antibiotic bowel prep.  Plan: 1. Set up for elective colostomy reversal.  Review of Systems as stated in this history (HPI) or in the review of systems. Otherwise all other 12 point ROS are negative  Past Medical History: 1. Diverticular disease Sigmoid colectomy, end ostomy, and SB resection on 04/13/2020 - [name] 2. HTN 3. Fibromyalgia 4. On chronic pain meds for fibromyalgia - she takes hydrocodone daily 5. History of colonoscopy by Dr. Hilarie [name] - 2016. 6. She has quit smoking since surgery  Social History: Married. Husband - [name] She has 5 children She works from home for [employer].  The patient's family  history was non contributory.   Allergies ([name], CMA; 09/20/2019 10:17 AM) Penicillins  Lisinopril *ANTIHYPERTENSIVES*   Medication History ([name], CMA; 09/20/2019 10:18 AM) HYDROcodone-Acetaminophen (10-325MG  Tablet, Oral) Active. Gabapentin (300MG  Capsule, Oral) Active. Metoprolol Succinate ER (100MG  Tablet ER 24HR, Oral) Active. Medications Reconciled  Vitals ([name] CMA; 09/20/2019 10:18 AM) 09/20/2019 10:18 AM Weight: 192.38 lb Height: 64in Body Surface Area: 1.92 m Body Mass Index: 33.02 kg/m  Temp.: 98.22F (Tympanic)  Pulse: 87 (Regular)  P.OX: 97% (Room air) BP: 148/88(Sitting, Left Arm, Standard)  Physical Exam   General: WN AA F who is alert and generally healthy appearing. She is wearing a mask. HEENT: Normal. Pupils equal.  Neck: Supple. No mass. No thyroid mass.  Lymph Nodes: No supraclavicular or cervical nodes.  Lungs: Clear to auscultation and symmetric breath sounds. Heart: RRR. No murmur or rub.  Abdomen: Soft. No mass. No tenderness. No hernia. Normal bowel sounds. She is a little obese. Midline incision healed. I do not feel a hernia. LUQ colostomy looks good.  Rectal: Not done.  Extremities: Good strength and ROM in upper and lower extremities.  Neurologic: Grossly intact to motor and sensory function.    Assessment & Plan  1.  COLON OBSTRUCTION (K56.609)  Story: Sigmoid colectomy, end ostomy, and small bowel resection on 04/13/2020 - [place]:   1. Set up for elective colostomy reversal (mechanical and antibiotic prep before surgery)  2. HTN 3. Fibromyalgia 4. On chronic pain meds for fibromyalgia - she takes hydrocodone daily 5. She has quit smoking since surgery  Regina Overall, MD, [facility] Office phone:  [phone]

## 2019-12-07 ENCOUNTER — Encounter (HOSPITAL_COMMUNITY): Payer: Self-pay | Admitting: Surgery

## 2019-12-07 ENCOUNTER — Inpatient Hospital Stay (HOSPITAL_COMMUNITY): Payer: Medicare HMO | Admitting: Anesthesiology

## 2019-12-07 ENCOUNTER — Inpatient Hospital Stay (HOSPITAL_COMMUNITY)
Admission: RE | Admit: 2019-12-07 | Discharge: 2019-12-10 | DRG: 331 | Disposition: A | Discharge status: Home / Self Care | Payer: Medicare HMO | Source: Ambulatory Visit | Attending: Surgery | Admitting: Surgery

## 2019-12-07 ENCOUNTER — Encounter (HOSPITAL_COMMUNITY)
Admission: RE | Disposition: A | Discharge status: Home / Self Care | Payer: Self-pay | Source: Ambulatory Visit | Attending: Surgery

## 2019-12-07 ENCOUNTER — Other Ambulatory Visit: Payer: Self-pay | Admitting: *Deleted

## 2019-12-07 ENCOUNTER — Other Ambulatory Visit: Payer: Self-pay

## 2019-12-07 DIAGNOSIS — G8929 Other chronic pain: Secondary | ICD-10-CM | POA: Diagnosis present

## 2019-12-07 DIAGNOSIS — Z79899 Other long term (current) drug therapy: Secondary | ICD-10-CM

## 2019-12-07 DIAGNOSIS — Z9889 Other specified postprocedural states: Secondary | ICD-10-CM

## 2019-12-07 DIAGNOSIS — I10 Essential (primary) hypertension: Secondary | ICD-10-CM | POA: Diagnosis present

## 2019-12-07 DIAGNOSIS — K660 Peritoneal adhesions (postprocedural) (postinfection): Secondary | ICD-10-CM | POA: Diagnosis not present

## 2019-12-07 DIAGNOSIS — Z87891 Personal history of nicotine dependence: Secondary | ICD-10-CM

## 2019-12-07 DIAGNOSIS — Z433 Encounter for attention to colostomy: Secondary | ICD-10-CM | POA: Diagnosis not present

## 2019-12-07 DIAGNOSIS — M797 Fibromyalgia: Secondary | ICD-10-CM | POA: Diagnosis not present

## 2019-12-07 DIAGNOSIS — K565 Intestinal adhesions [bands], unspecified as to partial versus complete obstruction: Secondary | ICD-10-CM | POA: Diagnosis not present

## 2019-12-07 DIAGNOSIS — Z6833 Body mass index (BMI) 33.0-33.9, adult: Secondary | ICD-10-CM | POA: Diagnosis not present

## 2019-12-07 DIAGNOSIS — Z20822 Contact with and (suspected) exposure to covid-19: Secondary | ICD-10-CM | POA: Diagnosis not present

## 2019-12-07 DIAGNOSIS — E669 Obesity, unspecified: Secondary | ICD-10-CM | POA: Diagnosis not present

## 2019-12-07 DIAGNOSIS — Z79891 Long term (current) use of opiate analgesic: Secondary | ICD-10-CM | POA: Diagnosis not present

## 2019-12-07 HISTORY — PX: COLOSTOMY TAKEDOWN: SHX5258

## 2019-12-07 HISTORY — DX: Other specified postprocedural states: Z98.890

## 2019-12-07 SURGERY — CLOSURE, COLOSTOMY, LAPAROSCOPIC
Anesthesia: General

## 2019-12-07 MED ORDER — LIDOCAINE 2% (20 MG/ML) 5 ML SYRINGE
INTRAMUSCULAR | Status: AC
  Filled 2019-12-07: qty 5

## 2019-12-07 MED ORDER — CIPROFLOXACIN IN D5W 400 MG/200ML IV SOLN
INTRAVENOUS | Status: DC | PRN
Start: 2019-12-07 — End: 2019-12-07
  Administered 2019-12-07: 400 mg via INTRAVENOUS

## 2019-12-07 MED ORDER — PHENYLEPHRINE HCL (PRESSORS) 10 MG/ML IV SOLN
INTRAVENOUS | Status: AC
  Filled 2019-12-07: qty 2

## 2019-12-07 MED ORDER — FENTANYL CITRATE (PF) 100 MCG/2ML IJ SOLN
INTRAMUSCULAR | Status: AC
  Filled 2019-12-07: qty 2

## 2019-12-07 MED ORDER — BUPIVACAINE LIPOSOME 1.3 % IJ SUSP
20.0000 mL | Freq: Once | INTRAMUSCULAR | Status: AC
  Administered 2019-12-07: 20 mL
  Filled 2019-12-07: qty 20

## 2019-12-07 MED ORDER — PROMETHAZINE HCL 25 MG/ML IJ SOLN: 6.2500 mg | INTRAMUSCULAR | Status: DC | PRN

## 2019-12-07 MED ORDER — ONDANSETRON HCL 4 MG/2ML IJ SOLN
INTRAMUSCULAR | Status: DC | PRN
  Administered 2019-12-07: 4 mg via INTRAVENOUS

## 2019-12-07 MED ORDER — LIDOCAINE 2% (20 MG/ML) 5 ML SYRINGE
INTRAMUSCULAR | Status: DC | PRN
  Administered 2019-12-07: 60 mg via INTRAVENOUS
  Administered 2019-12-07: 1.5 mg/kg/h via INTRAVENOUS

## 2019-12-07 MED ORDER — PHENYLEPHRINE HCL-NACL 20-0.9 MG/250ML-% IV SOLN
INTRAVENOUS | Status: DC | PRN
  Administered 2019-12-07: 25 ug/min via INTRAVENOUS

## 2019-12-07 MED ORDER — PHENYLEPHRINE 40 MCG/ML (10ML) SYRINGE FOR IV PUSH (FOR BLOOD PRESSURE SUPPORT)
PREFILLED_SYRINGE | INTRAVENOUS | Status: DC | PRN
  Administered 2019-12-07 (×2): 80 ug via INTRAVENOUS

## 2019-12-07 MED ORDER — ONDANSETRON HCL 4 MG/2ML IJ SOLN: 4.0000 mg | Freq: Four times a day (QID) | INTRAMUSCULAR | Status: DC | PRN

## 2019-12-07 MED ORDER — BUPIVACAINE HCL 0.25 % IJ SOLN
INTRAMUSCULAR | Status: AC
  Filled 2019-12-07: qty 1

## 2019-12-07 MED ORDER — MIDAZOLAM HCL 5 MG/5ML IJ SOLN
INTRAMUSCULAR | Status: DC | PRN
  Administered 2019-12-07: 2 mg via INTRAVENOUS

## 2019-12-07 MED ORDER — ORAL CARE MOUTH RINSE: 15.0000 mL | Freq: Once | OROMUCOSAL | Status: AC

## 2019-12-07 MED ORDER — ONDANSETRON HCL 4 MG PO TABS: 4.0000 mg | ORAL_TABLET | Freq: Four times a day (QID) | ORAL | Status: DC | PRN

## 2019-12-07 MED ORDER — MIDAZOLAM HCL 2 MG/2ML IJ SOLN
INTRAMUSCULAR | Status: AC
  Filled 2019-12-07: qty 2

## 2019-12-07 MED ORDER — TRAMADOL HCL 50 MG PO TABS
ORAL_TABLET | ORAL | Status: AC
  Filled 2019-12-07: qty 1

## 2019-12-07 MED ORDER — DEXAMETHASONE SODIUM PHOSPHATE 10 MG/ML IJ SOLN
INTRAMUSCULAR | Status: AC
  Filled 2019-12-07: qty 1

## 2019-12-07 MED ORDER — 0.9 % SODIUM CHLORIDE (POUR BTL) OPTIME
TOPICAL | Status: DC | PRN
  Administered 2019-12-07: 2000 mL

## 2019-12-07 MED ORDER — KETAMINE HCL 10 MG/ML IJ SOLN
INTRAMUSCULAR | Status: AC
  Filled 2019-12-07: qty 1

## 2019-12-07 MED ORDER — FENTANYL CITRATE (PF) 100 MCG/2ML IJ SOLN
25.0000 ug | INTRAMUSCULAR | Status: DC | PRN
  Administered 2019-12-07 (×2): 50 ug via INTRAVENOUS

## 2019-12-07 MED ORDER — ALBUTEROL SULFATE (2.5 MG/3ML) 0.083% IN NEBU: 3.0000 mL | INHALATION_SOLUTION | RESPIRATORY_TRACT | Status: DC | PRN

## 2019-12-07 MED ORDER — ROCURONIUM BROMIDE 10 MG/ML (PF) SYRINGE
PREFILLED_SYRINGE | INTRAVENOUS | Status: DC | PRN
  Administered 2019-12-07: 10 mg via INTRAVENOUS
  Administered 2019-12-07: 50 mg via INTRAVENOUS

## 2019-12-07 MED ORDER — LACTATED RINGERS IV SOLN: INTRAVENOUS | Status: DC

## 2019-12-07 MED ORDER — HEPARIN SODIUM (PORCINE) 5000 UNIT/ML IJ SOLN
5000.0000 [IU] | Freq: Once | INTRAMUSCULAR | Status: AC
  Administered 2019-12-07: 5000 [IU] via SUBCUTANEOUS
  Filled 2019-12-07: qty 1

## 2019-12-07 MED ORDER — KCL IN DEXTROSE-NACL 20-5-0.45 MEQ/L-%-% IV SOLN
INTRAVENOUS | Status: DC
  Filled 2019-12-07 (×6): qty 1000

## 2019-12-07 MED ORDER — BUPIVACAINE-EPINEPHRINE 0.25% -1:200000 IJ SOLN
INTRAMUSCULAR | Status: DC | PRN
  Administered 2019-12-07: 30 mL

## 2019-12-07 MED ORDER — ONDANSETRON HCL 4 MG/2ML IJ SOLN
INTRAMUSCULAR | Status: AC
  Filled 2019-12-07: qty 2

## 2019-12-07 MED ORDER — PROPOFOL 500 MG/50ML IV EMUL
INTRAVENOUS | Status: AC
  Filled 2019-12-07: qty 50

## 2019-12-07 MED ORDER — ROCURONIUM BROMIDE 10 MG/ML (PF) SYRINGE
PREFILLED_SYRINGE | INTRAVENOUS | Status: AC
  Filled 2019-12-07: qty 10

## 2019-12-07 MED ORDER — ENSURE SURGERY PO LIQD
237.0000 mL | Freq: Two times a day (BID) | ORAL | Status: DC
  Administered 2019-12-08 – 2019-12-09 (×4): 237 mL via ORAL

## 2019-12-07 MED ORDER — ENOXAPARIN SODIUM 40 MG/0.4ML ~~LOC~~ SOLN
40.0000 mg | SUBCUTANEOUS | Status: DC
  Administered 2019-12-08 – 2019-12-09 (×2): 40 mg via SUBCUTANEOUS
  Filled 2019-12-07 (×3): qty 0.4

## 2019-12-07 MED ORDER — CHLORHEXIDINE GLUCONATE 0.12 % MT SOLN
15.0000 mL | Freq: Once | OROMUCOSAL | Status: AC
  Administered 2019-12-07: 15 mL via OROMUCOSAL

## 2019-12-07 MED ORDER — CIPROFLOXACIN IN D5W 400 MG/200ML IV SOLN
INTRAVENOUS | Status: AC
  Filled 2019-12-07: qty 200

## 2019-12-07 MED ORDER — ACETAMINOPHEN 500 MG PO TABS
1000.0000 mg | ORAL_TABLET | Freq: Four times a day (QID) | ORAL | Status: DC
  Administered 2019-12-07 – 2019-12-08 (×3): 1000 mg via ORAL
  Filled 2019-12-07 (×3): qty 2

## 2019-12-07 MED ORDER — LIDOCAINE HCL 2 % IJ SOLN
INTRAMUSCULAR | Status: AC
  Filled 2019-12-07: qty 20

## 2019-12-07 MED ORDER — MORPHINE SULFATE (PF) 4 MG/ML IV SOLN
1.0000 mg | INTRAVENOUS | Status: DC | PRN
  Administered 2019-12-07: 18:00:00 4 mg via INTRAVENOUS
  Administered 2019-12-07: 22:00:00 2 mg via INTRAVENOUS
  Filled 2019-12-07 (×2): qty 1

## 2019-12-07 MED ORDER — ACETAMINOPHEN 500 MG PO TABS
1000.0000 mg | ORAL_TABLET | ORAL | Status: AC
  Administered 2019-12-07: 1000 mg via ORAL
  Filled 2019-12-07: qty 2

## 2019-12-07 MED ORDER — CHLORHEXIDINE GLUCONATE CLOTH 2 % EX PADS: 6.0000 | MEDICATED_PAD | Freq: Once | CUTANEOUS | Status: DC

## 2019-12-07 MED ORDER — LACTATED RINGERS IR SOLN
Status: DC | PRN
  Administered 2019-12-07: 1000 mL

## 2019-12-07 MED ORDER — PROPOFOL 10 MG/ML IV BOLUS
INTRAVENOUS | Status: DC | PRN
  Administered 2019-12-07: 150 mg via INTRAVENOUS
  Administered 2019-12-07: 25 ug/kg/min via INTRAVENOUS

## 2019-12-07 MED ORDER — CHLORHEXIDINE GLUCONATE CLOTH 2 % EX PADS
6.0000 | MEDICATED_PAD | Freq: Every day | CUTANEOUS | Status: DC
  Administered 2019-12-07 – 2019-12-08 (×2): 6 via TOPICAL

## 2019-12-07 MED ORDER — KETAMINE HCL 10 MG/ML IJ SOLN
INTRAMUSCULAR | Status: DC | PRN
  Administered 2019-12-07 (×2): 15 mg via INTRAVENOUS

## 2019-12-07 MED ORDER — ALVIMOPAN 12 MG PO CAPS
12.0000 mg | ORAL_CAPSULE | ORAL | Status: AC
  Administered 2019-12-07: 12 mg via ORAL
  Filled 2019-12-07: qty 1

## 2019-12-07 MED ORDER — TRAMADOL HCL 50 MG PO TABS
50.0000 mg | ORAL_TABLET | Freq: Four times a day (QID) | ORAL | Status: DC | PRN
  Administered 2019-12-07: 50 mg via ORAL

## 2019-12-07 MED ORDER — PROPOFOL 10 MG/ML IV BOLUS
INTRAVENOUS | Status: AC
  Filled 2019-12-07: qty 20

## 2019-12-07 MED ORDER — METOPROLOL SUCCINATE ER 100 MG PO TB24
100.0000 mg | ORAL_TABLET | Freq: Every day | ORAL | Status: DC
  Administered 2019-12-08 – 2019-12-09 (×2): 100 mg via ORAL
  Filled 2019-12-07 (×3): qty 1

## 2019-12-07 MED ORDER — DEXAMETHASONE SODIUM PHOSPHATE 10 MG/ML IJ SOLN
INTRAMUSCULAR | Status: DC | PRN
  Administered 2019-12-07: 8 mg via INTRAVENOUS

## 2019-12-07 MED ORDER — FENTANYL CITRATE (PF) 100 MCG/2ML IJ SOLN
INTRAMUSCULAR | Status: DC | PRN
  Administered 2019-12-07 (×4): 25 ug via INTRAVENOUS
  Administered 2019-12-07 (×2): 50 ug via INTRAVENOUS

## 2019-12-07 MED ORDER — PHENYLEPHRINE 40 MCG/ML (10ML) SYRINGE FOR IV PUSH (FOR BLOOD PRESSURE SUPPORT)
PREFILLED_SYRINGE | INTRAVENOUS | Status: AC
  Filled 2019-12-07: qty 10

## 2019-12-07 MED ORDER — BUPIVACAINE LIPOSOME 1.3 % IJ SUSP: 20.0000 mL | Freq: Once | INTRAMUSCULAR | Status: DC

## 2019-12-07 SURGICAL SUPPLY — 76 items
ADH SKN CLS APL DERMABOND .7 (GAUZE/BANDAGES/DRESSINGS) ×1
APPLIER CLIP 5 13 M/L LIGAMAX5 (MISCELLANEOUS)
APPLIER CLIP ROT 10 11.4 M/L (STAPLE)
APR CLP MED LRG 11.4X10 (STAPLE)
APR CLP MED LRG 5 ANG JAW (MISCELLANEOUS)
BLADE EXTENDED COATED 6.5IN (ELECTRODE) IMPLANT
BLADE HEX COATED 2.75 (ELECTRODE) IMPLANT
CABLE HIGH FREQUENCY MONO STRZ (ELECTRODE) ×3 IMPLANT
CELLS DAT CNTRL 66122 CELL SVR (MISCELLANEOUS) IMPLANT
CLIP APPLIE 5 13 M/L LIGAMAX5 (MISCELLANEOUS) IMPLANT
CLIP APPLIE ROT 10 11.4 M/L (STAPLE) IMPLANT
COVER SURGICAL LIGHT HANDLE (MISCELLANEOUS) ×6 IMPLANT
COVER WAND RF STERILE (DRAPES) IMPLANT
DECANTER SPIKE VIAL GLASS SM (MISCELLANEOUS) ×3 IMPLANT
DERMABOND ADVANCED (GAUZE/BANDAGES/DRESSINGS) ×2
DERMABOND ADVANCED .7 DNX12 (GAUZE/BANDAGES/DRESSINGS) IMPLANT
DISSECTOR BLUNT TIP ENDO 5MM (MISCELLANEOUS) IMPLANT
DRAIN CHANNEL 19F RND (DRAIN) IMPLANT
DRAPE UTILITY XL STRL (DRAPES) ×3 IMPLANT
DRSG OPSITE POSTOP 4X10 (GAUZE/BANDAGES/DRESSINGS) IMPLANT
DRSG OPSITE POSTOP 4X6 (GAUZE/BANDAGES/DRESSINGS) ×2 IMPLANT
DRSG OPSITE POSTOP 4X8 (GAUZE/BANDAGES/DRESSINGS) IMPLANT
ELECT REM PT RETURN 15FT ADLT (MISCELLANEOUS) ×3 IMPLANT
EVACUATOR SILICONE 100CC (DRAIN) IMPLANT
GAUZE SPONGE 4X4 12PLY STRL (GAUZE/BANDAGES/DRESSINGS) IMPLANT
GLOVE SURG SYN 7.5  E (GLOVE) ×6
GLOVE SURG SYN 7.5 E (GLOVE) ×2 IMPLANT
GLOVE SURG SYN 7.5 PF PI (GLOVE) ×2 IMPLANT
GOWN STRL REUS W/TWL XL LVL3 (GOWN DISPOSABLE) ×12 IMPLANT
HOLDER FOLEY CATH W/STRAP (MISCELLANEOUS) ×3 IMPLANT
KIT SIGMOIDOSCOPE (SET/KITS/TRAYS/PACK) ×2 IMPLANT
KIT TURNOVER KIT A (KITS) IMPLANT
NDL HYPO 25X1 1.5 SAFETY (NEEDLE) ×1 IMPLANT
NEEDLE HYPO 25X1 1.5 SAFETY (NEEDLE) ×3 IMPLANT
PACK COLON (CUSTOM PROCEDURE TRAY) ×3 IMPLANT
PAD POSITIONING PINK XL (MISCELLANEOUS) ×3 IMPLANT
PENCIL SMOKE EVACUATOR (MISCELLANEOUS) IMPLANT
PORT LAP GEL ALEXIS MED 5-9CM (MISCELLANEOUS) IMPLANT
PROTECTOR NERVE ULNAR (MISCELLANEOUS) IMPLANT
RETRACTOR WND ALEXIS 18 MED (MISCELLANEOUS) IMPLANT
RTRCTR WOUND ALEXIS 18CM MED (MISCELLANEOUS)
SCISSORS LAP 5X35 DISP (ENDOMECHANICALS) ×2 IMPLANT
SET IRRIG TUBING LAPAROSCOPIC (IRRIGATION / IRRIGATOR) ×3 IMPLANT
SET TUBE SMOKE EVAC HIGH FLOW (TUBING) ×3 IMPLANT
SHEARS HARMONIC ACE PLUS 36CM (ENDOMECHANICALS) IMPLANT
SHEARS HARMONIC ACE PLUS 45CM (MISCELLANEOUS) ×2 IMPLANT
SLEEVE ADV FIXATION 5X100MM (TROCAR) ×11 IMPLANT
STAPLER ECHELON POWER CIR 29 (STAPLE) ×2 IMPLANT
STAPLER VISISTAT 35W (STAPLE) ×3 IMPLANT
SURGILUBE 2OZ TUBE FLIPTOP (MISCELLANEOUS) IMPLANT
SUT ETHILON 3 0 PS 1 (SUTURE) IMPLANT
SUT MNCRL AB 4-0 PS2 18 (SUTURE) ×3 IMPLANT
SUT NOVA NAB DX-16 0-1 5-0 T12 (SUTURE) IMPLANT
SUT PDS AB 1 CT1 27 (SUTURE) ×4 IMPLANT
SUT PDS AB 1 CTX 36 (SUTURE) IMPLANT
SUT PDS AB 1 TP1 96 (SUTURE) IMPLANT
SUT PROLENE 2 0 KS (SUTURE) ×2 IMPLANT
SUT PROLENE 2 0 SH DA (SUTURE) IMPLANT
SUT SILK 2 0 (SUTURE) ×3
SUT SILK 2 0 SH CR/8 (SUTURE) ×3 IMPLANT
SUT SILK 2-0 18XBRD TIE 12 (SUTURE) ×1 IMPLANT
SUT SILK 3 0 (SUTURE) ×3
SUT SILK 3 0 SH CR/8 (SUTURE) ×3 IMPLANT
SUT SILK 3-0 18XBRD TIE 12 (SUTURE) ×1 IMPLANT
SUT VIC AB 3-0 SH 18 (SUTURE) IMPLANT
SYS LAPSCP GELPORT 120MM (MISCELLANEOUS)
SYSTEM LAPSCP GELPORT 120MM (MISCELLANEOUS) IMPLANT
TOWEL OR NON WOVEN STRL DISP B (DISPOSABLE) IMPLANT
TRAY FOLEY MTR SLVR 16FR STAT (SET/KITS/TRAYS/PACK) IMPLANT
TROCAR ADV FIXATION 11X100MM (TROCAR) IMPLANT
TROCAR ADV FIXATION 12X100MM (TROCAR) IMPLANT
TROCAR ADV FIXATION 5X100MM (TROCAR) ×3 IMPLANT
TROCAR BLADELESS OPT 5 100 (ENDOMECHANICALS) ×3 IMPLANT
TROCAR XCEL BLUNT TIP 100MML (ENDOMECHANICALS) IMPLANT
TUBING CONNECTING 10 (TUBING) ×4 IMPLANT
TUBING CONNECTING 10' (TUBING) ×2

## 2019-12-07 NOTE — Anesthesia Procedure Notes (Signed)
Procedure Name: Intubation Date/Time: 12/07/2019 7:27 AM Performed by: Victoriano Lain, CRNA Pre-anesthesia Checklist: Patient identified, Emergency Drugs available, Suction available, Patient being monitored and Timeout performed Patient Re-evaluated:Patient Re-evaluated prior to induction Oxygen Delivery Method: Circle system utilized Preoxygenation: Pre-oxygenation with 100% oxygen Induction Type: IV induction Ventilation: Mask ventilation without difficulty Laryngoscope Size: Mac and 4 Grade View: Grade I Tube type: Oral Tube size: 7.5 mm Number of attempts: 1 Airway Equipment and Method: Stylet Placement Confirmation: ETT inserted through vocal cords under direct vision,  positive ETCO2 and breath sounds checked- equal and bilateral Secured at: 21 cm Tube secured with: Tape Dental Injury: Teeth and Oropharynx as per pre-operative assessment

## 2019-12-07 NOTE — Transfer of Care (Addendum)
Immediate Anesthesia Transfer of Care Note  Patient: [name]  Procedure(s) Performed: LAPAROSCOPIC COLOSTOMY REVERSAL (N/A )  Patient Location: PACU  Anesthesia Type:General  Level of Consciousness: awake, alert , oriented and patient cooperative  Airway & Oxygen Therapy: Patient Spontanous Breathing and Patient connected to face mask oxygen  Post-op Assessment: Report given to RN, Post -op Vital signs reviewed and stable and Patient moving all extremities  Post vital signs: Reviewed and stable  Last Vitals:  Vitals Value Taken Time  BP 133/75 12/07/19 1126  Temp    Pulse 81 12/07/19 1127  Resp 28 12/07/19 1127  SpO2 100 % 12/07/19 1127  Vitals shown include unvalidated device data.  Last Pain:  Vitals:   12/07/19 0608  TempSrc: Oral         Complications: No complications documented.

## 2019-12-07 NOTE — Anesthesia Postprocedure Evaluation (Signed)
Anesthesia Post Note  Patient: [name]  Procedure(s) Performed: LAPAROSCOPIC COLOSTOMY REVERSAL (N/A )     Patient location during evaluation: PACU Anesthesia Type: General Level of consciousness: awake and alert, awake and oriented Pain management: pain level controlled Vital Signs Assessment: post-procedure vital signs reviewed and stable Respiratory status: spontaneous breathing, nonlabored ventilation and respiratory function stable Cardiovascular status: blood pressure returned to baseline and stable Postop Assessment: no apparent nausea or vomiting Anesthetic complications: no   No complications documented.  Last Vitals:  Vitals:   12/07/19 1300 12/07/19 1350  BP:    Pulse: 75 79  Resp: 20   Temp:  36.7 C  SpO2: 95% 98%    Last Pain:  Vitals:   12/07/19 1350  TempSrc:   PainSc: Asleep                 Catalina Gravel

## 2019-12-07 NOTE — Patient Outreach (Signed)
[facility]) Care Management  12/07/2019  Lexi Conaty Ricciardelli June 19, 1950 [id number]   Covering for assigned RNCM L. Zigmund Daniel.  In preparation of making 2nd outreach attempt, noted that member is currently admitted to hospital for colostomy reversal.  If discharged over the next 3 business days, will follow up with member.  If not, will have assigned RNCM follow up within the next week.  Valente David, [place], MSN [place] [phone]

## 2019-12-07 NOTE — Interval H&P Note (Signed)
History and Physical Interval Note:  12/07/2019 7:14 AM  [name]  has presented today for surgery, with the diagnosis of end colostomy.  The various methods of treatment have been discussed with the patient and family.  Her husband is here with her.  After consideration of risks, benefits and other options for treatment, the patient has consented to  Procedure(s): LAPAROSCOPIC COLOSTOMY REVERSAL (N/A) as a surgical intervention.  The patient's history has been reviewed, patient examined, no change in status, stable for surgery.  I have reviewed the patient's chart and labs.  Questions were answered to the patient's satisfaction.     Shann Medal

## 2019-12-07 NOTE — Op Note (Addendum)
12/07/2019  11:25 AM  PATIENT:  [name], 69 y.o., female, MRN: [id number]  PREOP DIAGNOSIS:  end colostomy  POSTOP DIAGNOSIS:   End colostomy, abdominal adhesions  PROCEDURE:   Procedure(s):  LAPAROSCOPIC COLOSTOMY REVERSAL, Enterolysis of adhesion for 1 hour, mobilization of splenic flexure  SURGEON:   Alphonsa Overall, M.D.  ASSISTANTShann Medal, M.D.  ANESTHESIA:   general  Anesthesiologist: Catalina Gravel, MD CRNA: Victoriano Lain, CRNA; Maxwell Caul, CRNA; Teheng, Ryan E, CRNA  General  EBL:  <75  ml  BLOOD ADMINISTERED: none  DRAINS: none   LOCAL MEDICATIONS USED:   20 cc of Exparel + 30 cc 1/4% marcaine  SPECIMEN:   None  COUNTS CORRECT:  YES  INDICATIONS FOR PROCEDURE:  [name] is a 69 y.o. (DOB: 1950-09-16) AA female whose primary care physician is Laurey Morale, MD and comes for laparoscopic closure of end colostomy.   The indications and risks of the surgery were explained to the patient.  The risks include, but are not limited to, infection, bleeding, and nerve injury.  PROCEDURE: The patient was taken to room #1 at [facility].  She underwent a general anesthesia.  She was placed in lithotomy position.  Her abdomen was prepped with ChloraPrep and her perineum prepped with Betadine.  Her colostomy was sewn closed with 2-0 silk suture.  A timeout was held the surgical checklist run.  I accessed her abdominal cavity through the right upper quadrant with a 5 mm Ethicon Optiview trocar.  I placed 6 additional trochars: A 5 mm trocar in the left upper quadrant, a 5 mm in the left lower quadrant, a 5 mm trocar in the lower midline, a 5 mm in the right lower quadrant, a 5 mm trocar in the right midabdomen, and a 5 mm trocar in the upper midline abdomen.  I placed a bilateral TAP block with 20 cc of local (Exparel + Marcaine)  I spent about 1 hour taken down intra-abdominal adhesions.  There were adhesions around the anterior abdominal  wall in the midline and the colostomy.  There were adhesions in the left retroperitoneal space where the colon had been before.  And there were adhesions in the pelvis.  I took down all these adhesions with sharp scissors.  I then mobilized the left colon and splenic flexure.  I dissected the left colon off the retroperitoneum anterior to the left kidney.  And I mobilized the left transverse colon off the gastrocolic ligament and took down the splenic flexure.  This appeared to give adequate mobility to the colostomy and left colon.  At this point we identified the rectal stump laparoscopically.  Dr. Brantley Stage went below and passed a 28 EEA sizer up the rectum to the rectal stump.  I had identified both sutures that I marked the rectal stump with before.  I took down the colostomy.  I placed a wound protector in the colostomy incision.  I freshened up the dissected colostomy about 4 cm from the end.  I used a pursestring device and passed a 2-0 Prolene suture through the device.  I cut off the distal colon.  Because her pathology has been benign, I did not send this to pathology.   I placed some buttressing 3-0 silk sutures around the cut colon and pursestring.  I placed a 29 Ethicon EEA anvil into the proximal colon and cinched down my pursestring.  I dropped the anvil into the abdominal cavity and  Dr. Brantley Stage went below to pass the Ethicon  EEA stapler and to help perform the anastomosis.   He advanced the 29 Ethicon EEA stapler through the rectum up to the rectal stump and advanced the spike through the rectal stump.  I attached the anvil to the device.  He then cinched the device down and fired it.  We had two complete rings in the EEA.  He then insufflated the rectal stump distal colon with a rigid sigmoidoscope and there was no evidence of leak or bubbling.  The anastomosis appeared to be at about 15 cm.  I then reprepped the abdomen for a clean closure.  I changed gown and gloves.  I closed the  colostomy site with 2 running #1 PDS sutures.  I irrigated the wound out with saline.  I infiltrated 10 cc of local in the wound.  I then removed all the trochars.  I closed the trocar wounds with 4-0 Monocryl suture and painted this with Dermabond.  The colostomy site was closed with staples with 2 Telfa wicks in the wound.  I have a surgeon as a first assist to retract, expose, and assist on this difficult operation.  The patient tolerated procedure well was transferred recovery in good condition.   Alphonsa Overall, MD, [facility] Surgery Office phone:  [phone]

## 2019-12-08 ENCOUNTER — Encounter (HOSPITAL_COMMUNITY): Payer: Self-pay | Admitting: Surgery

## 2019-12-08 LAB — CBC
HCT: 39.9 % (ref 36.0–46.0)
Hemoglobin: 12.2 g/dL (ref 12.0–15.0)
MCH: 26 pg (ref 26.0–34.0)
MCHC: 30.6 g/dL (ref 30.0–36.0)
MCV: 84.9 fL (ref 80.0–100.0)
Platelets: 319 10*3/uL (ref 150–400)
RBC: 4.7 MIL/uL (ref 3.87–5.11)
RDW: 14.4 % (ref 11.5–15.5)
WBC: 16.5 10*3/uL — ABNORMAL HIGH (ref 4.0–10.5)
nRBC: 0 % (ref 0.0–0.2)

## 2019-12-08 LAB — BASIC METABOLIC PANEL
Anion gap: 7 (ref 5–15)
BUN: 11 mg/dL (ref 8–23)
CO2: 25 mmol/L (ref 22–32)
Calcium: 8.7 mg/dL — ABNORMAL LOW (ref 8.9–10.3)
Chloride: 102 mmol/L (ref 98–111)
Creatinine, Ser: 0.79 mg/dL (ref 0.44–1.00)
GFR calc Af Amer: >60 mL/min (ref >60)
GFR calc non Af Amer: >60 mL/min (ref >60)
Glucose, Bld: 143 mg/dL — ABNORMAL HIGH (ref 70–99)
Potassium: 3.9 mmol/L (ref 3.5–5.1)
Sodium: 134 mmol/L — ABNORMAL LOW (ref 135–145)

## 2019-12-08 MED ORDER — ALVIMOPAN 12 MG PO CAPS
12.0000 mg | ORAL_CAPSULE | Freq: Two times a day (BID) | ORAL | Status: DC
  Administered 2019-12-08 (×2): 12 mg via ORAL
  Filled 2019-12-08 (×2): qty 1

## 2019-12-08 MED ORDER — HYDROCODONE-ACETAMINOPHEN 5-325 MG PO TABS
1.0000 | ORAL_TABLET | ORAL | Status: DC | PRN
  Administered 2019-12-08 – 2019-12-09 (×4): 1 via ORAL
  Administered 2019-12-09 – 2019-12-10 (×3): 2 via ORAL
  Filled 2019-12-08 (×2): qty 1
  Filled 2019-12-08: qty 2
  Filled 2019-12-08 (×2): qty 1
  Filled 2019-12-08 (×2): qty 2

## 2019-12-08 NOTE — Progress Notes (Signed)
[facility] Office:  [phone] General Surgery Progress Note   LOS: 1 [place]  POD -  1 [place] Post-Op  Assessment and Plan: 1.  LAPAROSCOPIC COLOSTOMY REVERSAL, Enterolysis of adhesion for 1 hour, mobilization of splenic flexure - 7/29 Lucia Gaskins  On clears  Plan:  Go slow with diet because of extensive enterolysis, change pain meds to vicodin, d/c foley  2. HTN 3. Fibromyalgia 4. On chronic pain meds for fibromyalgia - she takes hydrocodone daily   Active Problems:   History of colostomy reversal  Subjective:  Doing well and had a good night.  Has walked in the halls already.  Objective:   Vitals:   12/08/19 0115 12/08/19 0526  BP: (!) 100/58 122/68  Pulse: 65 59  Resp: 16 14  Temp: 98.8 F (37.1 C) 98.1 F (36.7 C)  SpO2: 91% 94%     Intake/Output from previous [place]:  07/29 0701 - 07/30 0700 In: 4991.7 [P.O.:460; I.V.:4331.7; IV Piggyback:200] Out: 1475 [Urine:1450; Blood:25]  Intake/Output this shift:  No intake/output data recorded.   Physical Exam:   General: WN obese AA F who is alert and oriented.    HEENT: Normal. Pupils equal. .   Lungs: Clear   Abdomen: Soft.  Rare BS   Wound: Ostomy site covered.  Port sites okay.   Lab Results:    Recent Labs    12/08/19 0508  WBC 16.5*  HGB 12.2  HCT 39.9  PLT 319    BMET   Recent Labs    12/08/19 0508  NA 134*  K 3.9  CL 102  CO2 25  GLUCOSE 143*  BUN 11  CREATININE 0.79  CALCIUM 8.7*    PT/INR  No results for input(s): LABPROT, INR in the last 72 hours.  ABG  No results for input(s): PHART, HCO3 in the last 72 hours.  Invalid input(s): PCO2, PO2   Studies/Results:  No results found.   Anti-infectives:   Anti-infectives (From admission, onward)   Start     Dose/Rate Route Frequency Ordered Stop   12/07/19 0715  ciprofloxacin (CIPRO) 400 MG/200ML IVPB       Note to Pharmacy: Carleene Cooper   : cabinet override      12/07/19 0715 12/07/19 0740      Alphonsa Overall, MD,  [facility] Office: [phone] 12/08/2019

## 2019-12-09 LAB — BASIC METABOLIC PANEL
Anion gap: 8 (ref 5–15)
BUN: 9 mg/dL (ref 8–23)
CO2: 25 mmol/L (ref 22–32)
Calcium: 8.8 mg/dL — ABNORMAL LOW (ref 8.9–10.3)
Chloride: 105 mmol/L (ref 98–111)
Creatinine, Ser: 0.72 mg/dL (ref 0.44–1.00)
GFR calc Af Amer: >60 mL/min (ref >60)
GFR calc non Af Amer: >60 mL/min (ref >60)
Glucose, Bld: 117 mg/dL — ABNORMAL HIGH (ref 70–99)
Potassium: 3.8 mmol/L (ref 3.5–5.1)
Sodium: 138 mmol/L (ref 135–145)

## 2019-12-09 LAB — CBC WITH DIFFERENTIAL/PLATELET
Abs Immature Granulocytes: 0.07 10*3/uL (ref 0.00–0.07)
Basophils Absolute: 0 10*3/uL (ref 0.0–0.1)
Basophils Relative: 0 %
Eosinophils Absolute: 0.1 10*3/uL (ref 0.0–0.5)
Eosinophils Relative: 0 %
HCT: 39.8 % (ref 36.0–46.0)
Hemoglobin: 12.3 g/dL (ref 12.0–15.0)
Immature Granulocytes: 0 %
Lymphocytes Relative: 14 %
Lymphs Abs: 2.5 10*3/uL (ref 0.7–4.0)
MCH: 26.1 pg (ref 26.0–34.0)
MCHC: 30.9 g/dL (ref 30.0–36.0)
MCV: 84.5 fL (ref 80.0–100.0)
Monocytes Absolute: 1.5 10*3/uL — ABNORMAL HIGH (ref 0.1–1.0)
Monocytes Relative: 9 %
Neutro Abs: 13.7 10*3/uL — ABNORMAL HIGH (ref 1.7–7.7)
Neutrophils Relative %: 77 %
Platelets: 311 10*3/uL (ref 150–400)
RBC: 4.71 MIL/uL (ref 3.87–5.11)
RDW: 14.5 % (ref 11.5–15.5)
WBC: 17.9 10*3/uL — ABNORMAL HIGH (ref 4.0–10.5)
nRBC: 0 % (ref 0.0–0.2)

## 2019-12-09 NOTE — Progress Notes (Signed)
Received order from Dr Dema Severin to advance diet to soft.

## 2019-12-09 NOTE — Progress Notes (Signed)
[facility] Office:  [phone] General Surgery Progress Note   LOS: 2 days  POD -  2 Days Post-Op  Assessment and Plan: 1.  LAPAROSCOPIC COLOSTOMY REVERSAL, Enterolysis of adhesion for 1 hour, mobilization of splenic flexure - 7/29 Lucia Gaskins  On clears  Plan:  Having good reliable bowel function - 7 bm in last 24hrs and lots of flatus; no nausea/vomiting and hungry - advance to soft diet   2. HTN 3. Fibromyalgia 4. On chronic pain meds for fibromyalgia - she takes hydrocodone daily   Active Problems:   History of colostomy reversal  Subjective:  Doing well and had a good night.  Has walked in the halls and having good bowel function. Hungry  Objective:   Vitals:   12/08/19 2115 12/09/19 0723  BP: (!) 161/72 (!) 152/78  Pulse: 64 69  Resp: 18 18  Temp: 98.6 F (37 C) 98 F (36.7 C)  SpO2: 94% 94%     Intake/Output from previous [place]:  07/30 0701 - 07/31 0700 In: 3147.5 [P.O.:1200; I.V.:1947.5] Out: 2360 [Urine:2360]  Intake/Output this shift:  No intake/output data recorded.   Physical Exam:   General: WN obese AA F who is alert and oriented.    HEENT: Normal. Pupils equal. .   Lungs: Clear   Abdomen: Soft.  Nontender, not significantly distended. Incisions c/d/i without drainage - colstomy site with wicks in place   Wound: Ostomy site covered.  Port sites okay.   Lab Results:    Recent Labs    12/08/19 0508 12/09/19 0527  WBC 16.5* 17.9*  HGB 12.2 12.3  HCT 39.9 39.8  PLT 319 311    BMET   Recent Labs    12/08/19 0508 12/09/19 0527  NA 134* 138  K 3.9 3.8  CL 102 105  CO2 25 25  GLUCOSE 143* 117*  BUN 11 9  CREATININE 0.79 0.72  CALCIUM 8.7* 8.8*    PT/INR  No results for input(s): LABPROT, INR in the last 72 hours.  ABG  No results for input(s): PHART, HCO3 in the last 72 hours.  Invalid input(s): PCO2, PO2   Studies/Results:  No results found.   Anti-infectives:   Anti-infectives (From admission, onward)    Start     Dose/Rate Route Frequency Ordered Stop   12/07/19 0715  ciprofloxacin (CIPRO) 400 MG/200ML IVPB       Note to Pharmacy: Carleene Cooper   : cabinet override      12/07/19 0715 12/07/19 0740     Nadeen Landau, M.D. [facility] Use [email or web address] to contact on call provider

## 2019-12-10 MED ORDER — TRAMADOL HCL 50 MG PO TABS: 50.0000 mg | ORAL_TABLET | Freq: Four times a day (QID) | ORAL | 0 refills | Status: AC | PRN

## 2019-12-10 NOTE — Progress Notes (Signed)
Assessment unchanged. Pt verbalized understanding of dc instructions through teach back. Discharged via wc to front entrance accompanied by NT. 

## 2019-12-10 NOTE — Discharge Instructions (Signed)
POST OP INSTRUCTIONS AFTER COLON SURGERY  1. DIET: Be sure to include lots of fluids daily to stay hydrated - 64oz of water per [place] (8, 8 oz glasses).  Avoid fast food or heavy meals for the first couple of weeks as your are more likely to get nauseated. Avoid raw/uncooked fruits or vegetables for the first 4 weeks (its ok to have these if they are blended into smoothie form). If you have fruits/vegetables, make sure they are cooked until soft enough to mash on the roof of your mouth and chew your food well. Otherwise, diet as tolerated.  2. Take your usually prescribed home medications unless otherwise directed.  3. PAIN CONTROL: a. Pain is best controlled by a usual combination of three different methods TOGETHER: i. Ice/Heat ii. Over the counter pain medication iii. Prescription pain medication b. Most patients will experience some swelling and bruising around the surgical site.  Ice packs or heating pads (30-60 minutes up to 6 times a Staat) will help. Some people prefer to use ice alone, heat alone, alternating between ice & heat.  Experiment to what works for you.  Swelling and bruising can take several weeks to resolve.   c. It is helpful to take an over-the-counter pain medication regularly for the first few weeks: i. Ibuprofen (Motrin/Advil) - 200mg  tabs - take 3 tabs (600mg ) every 6 hours as needed for pain (unless you have been directed previously to avoid NSAIDs/ibuprofen) ii. Acetaminophen (Tylenol) - you may take 650mg  every 6 hours as needed. You can take this with motrin as they act differently on the body. If you are taking a narcotic pain medication that has acetaminophen in it, do not take over the counter tylenol at the same time. iii. NOTE: You may take both of these medications together - most patients  find it most helpful when alternating between the two (i.e. Ibuprofen at 6am, tylenol at 9am, ibuprofen at 12pm ...) d. A  prescription for pain medication should be given to you  upon discharge.  Take your pain medication as prescribed if your pain is not adequatly controlled with the over-the-counter pain reliefs mentioned above.  4. Avoid getting constipated.  Between the surgery and the pain medications, it is common to experience some constipation.  Increasing fluid intake and taking a fiber supplement (such as Metamucil, Citrucel, FiberCon, MiraLax, etc) 1-2 times a Dimauro regularly will usually help prevent this problem from occurring.  A mild laxative (prune juice, Milk of Magnesia, MiraLax, etc) should be taken according to package directions if there are no bowel movements after 48 hours.    5. Dressing: Cover your colostomy closure site with dry gauze to protect your clothing. Ok to get soap and water over all your wounds in a shower. Avoid baths/pools/lakes/oceans until your wounds are all healed. Staples will need to be removed in the office in ~2 weeks. Please call to confirm your appointment tomorrow.  6. ACTIVITIES as tolerated:   a. Avoid heavy lifting (>10lbs or 1 gallon of milk) for the next 6 weeks. b. You may resume regular daily activities as tolerated--such as daily self-care, walking, climbing stairs--gradually increasing activities as tolerated.  If you can walk 30 minutes without difficulty, it is safe to try more intense activity such as jogging, treadmill, bicycling, low-impact aerobics.  c. DO NOT PUSH THROUGH PAIN.  Let pain be your guide: If it hurts to do something, don't do it. d. Dennis Bast may drive when you are no longer taking prescription pain  medication, you can comfortably wear a seatbelt, and you can safely maneuver your car and apply brakes.  7. FOLLOW UP in our office a. Please call [facility] at [phone] to set up an appointment to see your surgeon in the office for a follow-up appointment approximately 2 weeks after your surgery. b. Make sure that you call for this appointment the [place] you arrive home to insure a convenient appointment  time.  9. If you have disability or family leave forms that need to be completed, you may have them completed by your primary care physician's office; for return to work instructions, please ask our office staff and they will be happy to assist you in obtaining this documentation   When to call us [phone]: 1. Poor pain control 2. Reactions / problems with new medications (rash/itching, etc)  3. Fever over 101.5 F (38.5 C) 4. Inability to urinate 5. Nausea/vomiting 6. Worsening swelling or bruising 7. Continued bleeding from incision. 8. Increased pain, redness, or drainage from the incision  The clinic staff is available to answer your questions during regular business hours (8:30am-5pm).  Please don't hesitate to call and ask to speak to one of our nurses for clinical concerns.   A surgeon from [facility] is always on call at the hospitals   If you have a medical emergency, go to the nearest emergency room or call 911.  [facility], [place] [address] [place], [place], [place]  [address] MAIN: [phone] FAX: [phone] [email or web address]

## 2019-12-10 NOTE — Discharge Summary (Signed)
Patient ID: [name] MRN: [id number] DOB/AGE: Mar 14, 1951 69 y.o.  Admit date: 12/07/2019 Discharge date: 12/10/2019  Discharge Diagnoses Patient Active Problem List   Diagnosis Date Noted  . History of colostomy reversal 12/07/2019  . Large bowel obstruction ([place]) 04/13/2019  . Irritable bowel syndrome with both constipation and diarrhea 06/11/2017  . Tobacco abuse 05/03/2012  . HTN (hypertension) 12/09/2011  . INFLAMED SEBORRHEIC KERATOSIS 04/29/2010  . 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 EDEMA 01/01/2009  . CERUMEN IMPACTION 10/19/2008  . HEADACHE 10/19/2008  . URINARY INCONTINENCE 10/19/2008    Procedures OR 7/29 with Dr. Lucia Gaskins - LAPAROSCOPIC COLOSTOMY REVERSAL, Enterolysis of adhesion for 1 hour, mobilization of splenic flexure  Hospital Course: She was admitted postoperatively where she has recovered quite well. Her diet was advanced. She remained afebrile and on 12/10/19, had virtually no abdominal pain, good bowel function and had been reliably tolerating a diet for the last 24 hours without issue. She was requesting to go home and deemed stable for discharge home. We reviewed postoperative expectations and things to watch out for. Follow-up will be arranged in our office as well   Allergies as of 12/10/2019      Reactions   Lisinopril Anaphylaxis, Swelling   Tongue swelling   Penicillins Anaphylaxis, Hives   Egg [eggs Or Egg-derived Products] Nausea Only   Other    Strawberry-hives   Adhesive [tape] Rash      Medication List    STOP taking these medications   HYDROcodone-acetaminophen 10-325 MG tablet Commonly known as: NORCO     TAKE these medications   acetaminophen 650 MG CR tablet Commonly known as: TYLENOL Take 650 mg by mouth every 8 (eight) hours as needed for pain.   albuterol 108 (90 Base) MCG/ACT inhaler Commonly known as: VENTOLIN HFA INHALE 2 PUFFS INTO THE LUNGS EVERY 4 HOURS AS NEEDED FOR WHEEZING OR SHORTNESS OF  BREATH What changed: See the new instructions.   gabapentin 300 MG capsule Commonly known as: NEURONTIN TAKE 1 CAPSULE(300 MG) BY MOUTH THREE TIMES DAILY   hydrochlorothiazide 25 MG tablet Commonly known as: HYDRODIURIL Take 25 mg by mouth daily.   metoprolol succinate 100 MG 24 hr tablet Commonly known as: TOPROL-XL TAKE 1 TABLET BY MOUTH  DAILY WITH OR IMMEDIATLEY  FOLLOWING A MEAL What changed:   how much to take  how to take this  when to take this  additional instructions   oxybutynin 5 MG tablet Commonly known as: DITROPAN Take 5 mg by mouth 2 (two) times daily.   traMADol 50 MG tablet Commonly known as: Ultram Take 1 tablet (50 mg total) by mouth every 6 (six) hours as needed for up to 5 days (severe pain not controlled with tylenol and ibuprofen first).   VITAMIN C PO Take 1 tablet by mouth daily.         Follow-up Information    Alphonsa Overall, MD Follow up in 2 week(s).   Specialty: General Surgery Contact information: [address] [place] [address] [phone]               Sharon Mt. Dema Severin, M.D. [facility]

## 2019-12-10 NOTE — Progress Notes (Signed)
[facility] Office:  [phone] General Surgery Progress Note   LOS: 3 days  POD -  3 Days Post-Op  Assessment and Plan: 1.  LAPAROSCOPIC COLOSTOMY REVERSAL, Enterolysis of adhesion for 1 hour, mobilization of splenic flexure - 7/29 - [facility]  On soft  Plan:  Having good reliable bowel function - 7 bm in last 24hrs and lots of flatus; no nausea/vomiting; tolerating soft diet for last 24 hours without issue   2. HTN 3. Fibromyalgia 4. On chronic pain meds for fibromyalgia - she takes hydrocodone daily   Active Problems:   History of colostomy reversal  Subjective:  Doing well and had another good night.  Has walked in the halls and having good bowel function. Denies nausea or emesis. Minimal abdominal discomfort - still better than [place] prior  Objective:   Vitals:   12/09/19 2203 12/10/19 0552  BP: (!) 139/78 (!) 152/75  Pulse: 66 65  Resp: 16 17  Temp: 98 F (36.7 C) 97.7 F (36.5 C)  SpO2: 92% 93%     Intake/Output from previous [place]:  07/31 0701 - 08/01 0700 In: 2563.9 [P.O.:850; I.V.:1713.9] Out: 700 [Urine:700]  Intake/Output this shift:  No intake/output data recorded.   Physical Exam:   General: WN obese AA F who is alert and oriented.    HEENT: Normal. Pupils equal. .   Lungs: Clear   Abdomen: Soft.  Nontender throughtout, not distended. Incisions c/d/i without drainage - colstomy site with wicks I- these were removed today. No significant drainage.   Wound: Ostomy site clean.  Port sites look great.   Lab Results:    Recent Labs    12/08/19 0508 12/09/19 0527  WBC 16.5* 17.9*  HGB 12.2 12.3  HCT 39.9 39.8  PLT 319 311    BMET   Recent Labs    12/08/19 0508 12/09/19 0527  NA 134* 138  K 3.9 3.8  CL 102 105  CO2 25 25  GLUCOSE 143* 117*  BUN 11 9  CREATININE 0.79 0.72  CALCIUM 8.7* 8.8*    PT/INR  No results for input(s): LABPROT, INR in the last 72 hours.  ABG  No results for input(s): PHART, HCO3 in the last 72  hours.  Invalid input(s): PCO2, PO2   Studies/Results:  No results found.   Anti-infectives:   Anti-infectives (From admission, onward)   Start     Dose/Rate Route Frequency Ordered Stop   12/07/19 0715  ciprofloxacin (CIPRO) 400 MG/200ML IVPB       Note to Pharmacy: [name]   : cabinet override      12/07/19 0715 12/07/19 0740     Nadeen Landau, M.D. [facility] Use [email or web address] to contact on call provider

## 2019-12-11 ENCOUNTER — Other Ambulatory Visit: Payer: Self-pay | Admitting: *Deleted

## 2019-12-11 NOTE — Patient Outreach (Signed)
[facility]) Care Management  12/11/2019  Dayanis Bergquist Mcquilkin 1950-09-17 [id number]   Covering for assigned care manger, L. Zigmund Daniel.  Notified that member was discharged from hospital after having colostomy reversal.  Call placed to member to follow up on discharge.  She report she is doing well, still has staples at the surgical site, denies any drainage.  State she was told she didn't have to use a dressing to cover but has been doing so anyway so it doesn't rub against her clothing.  Has some pain/discomfort but relieved with Tylenol.  Follow up appointment with surgeon on 8/18, denies any urgent concerns at this time.  Will provide update to assigned RNCM to follow up within the next 3 weeks.  Goals Addressed              This Visit's Progress   .  Continue recovering from surgery (pt-stated)        CARE PLAN ENTRY (see longitudinal plan of care for additional care plan information)  Current Barriers:  Marland Kitchen Knowledge Deficits related to colostomy surgery  Nurse Case Manager Clinical Goal(s):  Marland Kitchen Over the next 28 days, patient will verbalize understanding of plan for post surgical care . Over the next 31 days, patient will not experience hospital admission. Hospital Admissions in last 6 months = 1 . Over the next 28 days, patient will attend all scheduled medical appointments: surgeon  Interventions:  . Inter-disciplinary care team collaboration (see longitudinal plan of care) . Discussed plans with patient for ongoing care management follow up and provided patient with direct contact information for care management team . Reviewed scheduled/upcoming provider appointments including:   Patient Self Care Activities:  . Attends all scheduled provider appointments . Performs ADL's independently  Initial goal documentation       Valente David, RN, MSN [place] [phone]

## 2019-12-28 DIAGNOSIS — M65312 Trigger thumb, left thumb: Secondary | ICD-10-CM | POA: Diagnosis not present

## 2019-12-29 ENCOUNTER — Other Ambulatory Visit: Payer: Self-pay | Admitting: *Deleted

## 2019-12-29 NOTE — Patient Outreach (Signed)
[facility] [facility]) Care Management  12/29/2019  [name] 1950-12-20 [id number]   Telephone Assessment-Successful  RN spoke with pt today as she continue to met all goals and continue her ongoing self management of care .   No reported issues as pt grateful for the follow up calls and agreed to go to a Health Coach for ongoing management of care. Will make a referral and alert pt's provider of her ongoing progress and disposition with [facility] services. No further issues or barriers at this time for complex case management to address.   Goals Addressed              This Visit's Progress   .  COMPLETED: Continue recovering from surgery (pt-stated)        CARE PLAN ENTRY (see longitudinal plan of care for additional care plan information)  Current Barriers:  Marland Kitchen Knowledge Deficits related to colostomy surgery  Nurse Case Manager Clinical Goal(s):  Marland Kitchen Over the next 28 days, patient will verbalize understanding of plan for post surgical care . Over the next 31 days, patient will not experience hospital admission. Hospital Admissions in last 6 months = 1 . Over the next 28 days, patient will attend all scheduled medical appointments: surgeon  Interventions:  . Inter-disciplinary care team collaboration (see longitudinal plan of care) . Discussed plans with patient for ongoing care management follow up and provided patient with direct contact information for care management team . Reviewed scheduled/upcoming provider appointments including:   Patient Self Care Activities:  . Attends all scheduled provider appointments . Performs ADL's independently  Initial goal documentation        [name], RN Care Management Coordinator [place] Office [phone]

## 2020-01-24 ENCOUNTER — Telehealth: Payer: Self-pay | Admitting: Family Medicine

## 2020-01-24 NOTE — Telephone Encounter (Signed)
Left message for patient to schedule Annual Wellness Visit.  Please schedule with Nurse Health Advisor Shannon Crews, RN at [facility]  

## 2020-01-25 ENCOUNTER — Other Ambulatory Visit: Payer: Self-pay | Admitting: *Deleted

## 2020-01-25 NOTE — Patient Outreach (Signed)
[facility]) Care Management  01/25/2020  [name] Harold 04-16-1951 [id number]   RN Health Coach attempted follow up outreach call to patient.  Patient was unavailable. HIPPA compliance voicemail message left with return callback number.  Plan: RN will call patient again within 30 days.  [place] Care Management [phone]

## 2020-01-30 ENCOUNTER — Other Ambulatory Visit: Payer: Self-pay | Admitting: *Deleted

## 2020-01-30 NOTE — Patient Outreach (Signed)
[facility]) Care Management  01/30/2020  Campbell Agramonte Labus Dec 26, 1950 [id number]   RN Health Coach attempted follow up outreach call to patient.  Patient was unavailable. HIPPA compliance voicemail message left with return callback number.  Plan: RN will call patient again within 30 days.  [place] Care Management [phone]

## 2020-02-01 ENCOUNTER — Ambulatory Visit: Payer: Medicare HMO

## 2020-02-01 DIAGNOSIS — M65312 Trigger thumb, left thumb: Secondary | ICD-10-CM | POA: Diagnosis not present

## 2020-02-06 ENCOUNTER — Ambulatory Visit (INDEPENDENT_AMBULATORY_CARE_PROVIDER_SITE_OTHER): Payer: Medicare HMO

## 2020-02-06 ENCOUNTER — Other Ambulatory Visit: Payer: Self-pay

## 2020-02-06 DIAGNOSIS — Z780 Asymptomatic menopausal state: Secondary | ICD-10-CM | POA: Diagnosis not present

## 2020-02-06 DIAGNOSIS — Z1231 Encounter for screening mammogram for malignant neoplasm of breast: Secondary | ICD-10-CM | POA: Diagnosis not present

## 2020-02-06 DIAGNOSIS — Z0000 Encounter for general adult medical examination without abnormal findings: Secondary | ICD-10-CM | POA: Diagnosis not present

## 2020-02-06 NOTE — Progress Notes (Signed)
Subjective:   [name] is a 69 y.o. female who presents for an Initial Medicare Annual Wellness Visit.  I connected with Gaynel Landsberg today by telephone and verified that I am speaking with the correct person using two identifiers. Location patient: home Location provider: work Persons participating in the virtual visit: patient, provider.   I discussed the limitations, risks, security and privacy concerns of performing an evaluation and management service by telephone and the availability of in person appointments. I also discussed with the patient that there may be a patient responsible charge related to this service. The patient expressed understanding and verbally consented to this telephonic visit.    Interactive audio and video telecommunications were attempted between this provider and patient, however failed, due to patient having technical difficulties OR patient did not have access to video capability.  We continued and completed visit with audio only.      Review of Systems    N/A Cardiac Risk Factors include: advanced age (>14men, >47 women);hypertension     Objective:    Today's Vitals   02/06/20 0826  PainSc: 7    There is no height or weight on file to calculate BMI.  Advanced Directives 02/06/2020 12/07/2019 11/30/2019 05/19/2019 04/13/2019 10/30/2016 10/15/2016  Does Patient Have a Medical Advance Directive? No;Yes No No No No No No  Type of Academic librarian;Living will - - - - - -  Does patient want to make changes to medical advance directive? No - Patient declined - - - - - -  Copy of [place] in Chart? Yes - validated most recent copy scanned in chart (See row information) - - - - - -  Would patient like information on creating a medical advance directive? - Yes (MAU/Ambulatory/Procedural Areas - Information given) Yes (MAU/Ambulatory/Procedural Areas - Information given) - No - Patient declined - -    Current  Medications (verified) Outpatient Encounter Medications as of 02/06/2020  Medication Sig  . acetaminophen (TYLENOL) 650 MG CR tablet Take 650 mg by mouth every 8 (eight) hours as needed for pain.   . Ascorbic Acid (VITAMIN C PO) Take 1 tablet by mouth daily.  . hydrochlorothiazide (HYDRODIURIL) 25 MG tablet Take 25 mg by mouth daily.  . metoprolol succinate (TOPROL-XL) 100 MG 24 hr tablet TAKE 1 TABLET BY MOUTH  DAILY WITH OR IMMEDIATLEY  FOLLOWING A MEAL (Patient taking differently: Take 100 mg by mouth in the morning and at bedtime. )  . oxybutynin (DITROPAN) 5 MG tablet Take 5 mg by mouth 2 (two) times daily.  Marland Kitchen albuterol (VENTOLIN HFA) 108 (90 Base) MCG/ACT inhaler INHALE 2 PUFFS INTO THE LUNGS EVERY 4 HOURS AS NEEDED FOR WHEEZING OR SHORTNESS OF BREATH (Patient not taking: Reported on 02/06/2020)  . gabapentin (NEURONTIN) 300 MG capsule TAKE 1 CAPSULE(300 MG) BY MOUTH THREE TIMES DAILY (Patient not taking: Reported on 02/06/2020)   No facility-administered encounter medications on file as of 02/06/2020.    Allergies (verified) Lisinopril, Penicillins, Egg [eggs or egg-derived products], Other, and Adhesive [tape]   History: Past Medical History:  Diagnosis Date  . Arthritis   . Cataract    bilateral repair  . Dyspnea   . Hypertension   . Neuromuscular disorder ([place])    nerve pain in neck  . Neuropathy   . Urinary incontinence   . Wears contact lenses   . Wears dentures    Past Surgical History:  Procedure Laterality Date  . ABDOMINAL  HYSTERECTOMY     oophorectomy  . APPENDECTOMY    . CERVICAL FUSION  2009   and plating  . COLECTOMY WITH COLOSTOMY CREATION/HARTMANN PROCEDURE N/A 04/14/2019   Procedure: SIGMOID COLECTOMY, RESECTION OF SMALL BOWEL,WITH END COLOSTOMY, PROCTOSCOPY RIGID;  Surgeon: Alphonsa Overall, MD;  Location: [facility];  Service: General;  Laterality: N/A;  . COLONOSCOPY  09/20/2014   per Dr. Hilarie Fredrickson, diverticulosis and hyperplastic polyps, repeat in 5 yrs   .  COLOSTOMY TAKEDOWN N/A 12/07/2019   Procedure: LAPAROSCOPIC COLOSTOMY REVERSAL;  Surgeon: Alphonsa Overall, MD;  Location: [facility];  Service: General;  Laterality: N/A;  . CYSTOSCOPY WITH STENT PLACEMENT Bilateral 04/14/2019   Procedure: CYSTOSCOPY WITH BILATERAL STENT PLACEMENT;  Surgeon: Robley Fries, MD;  Location: [facility];  Service: Urology;  Laterality: Bilateral;  . DILATION AND CURETTAGE OF UTERUS    . EAR CYST EXCISION Right 07/05/2014   Procedure: EXCISION FLEXOR SHEATH CYST WITH RELEASE A-1 PULLEY RIGHT SMALL FINGER;  Surgeon: Leanora Cover, MD;  Location: [place];  Service: Orthopedics;  Laterality: Right;  . STOMACH SURGERY    . TRIGGER FINGER RELEASE Right 07/05/2014   Procedure: RELEASE RIGHT SMALL FINGER/A-1 PULLEY;  Surgeon: Leanora Cover, MD;  Location: [place];  Service: Orthopedics;  Laterality: Right;  . TUBOPLASTY / TUBOTUBAL ANASTOMOSIS     Family History  Problem Relation Age of Onset  . Arthritis Other   . Breast cancer Other   . Hypertension Other   . Breast cancer Mother   . Colon cancer Neg Hx   . Colon polyps Neg Hx   . Esophageal cancer Neg Hx   . Stomach cancer Neg Hx   . Rectal cancer Neg Hx    Social History   Socioeconomic History  . Marital status: Married    Spouse name: Not on file  . Number of children: Not on file  . Years of education: Not on file  . Highest education level: Not on file  Occupational History  . Not on file  Tobacco Use  . Smoking status: Former Smoker    Packs/[place]: 0.50    Types: Cigarettes    Quit date: 06/02/2019    Years since quitting: 0.6  . Smokeless tobacco: Never Used  . Tobacco comment: 3 weeks ago  Vaping Use  . Vaping Use: Never used  Substance and Sexual Activity  . Alcohol use: No    Alcohol/week: 0.0 standard drinks  . Drug use: Never  . Sexual activity: Not on file  Other Topics Concern  . Not on file  Social History Narrative  . Not on file   Social Determinants of  Health   Financial Resource Strain: Low Risk   . Difficulty of Paying Living Expenses: Not hard at all  Food Insecurity: No Food Insecurity  . Worried About Charity fundraiser in the Last Year: Never true  . Ran Out of Food in the Last Year: Never true  Transportation Needs: No Transportation Needs  . Lack of Transportation (Medical): No  . Lack of Transportation (Non-Medical): No  Physical Activity: Sufficiently Active  . Days of Exercise per Week: 7 days  . Minutes of Exercise per Session: 40 min  Stress:   . Feeling of Stress : Not on file  Social Connections: Moderately Integrated  . Frequency of Communication with Friends and Family: More than three times a week  . Frequency of Social Gatherings with Friends and Family: Twice a week  . Attends  Religious Services: More than 4 times per year  . Active Member of Clubs or Organizations: No  . Attends Archivist Meetings: Never  . Marital Status: Married    Tobacco Counseling Counseling given: Not Answered Comment: 3 weeks ago   Clinical Intake:  Pre-visit preparation completed: Yes  Pain : 0-10 Pain Score: 7  Pain Type: Chronic pain Pain Location: Back Pain Orientation: Lower Pain Descriptors / Indicators: Aching Pain Onset: More than a month ago Pain Frequency: Intermittent Pain Relieving Factors: Asper cream  Pain Relieving Factors: Asper cream  Nutritional Risks: None Diabetes: No  How often do you need to have someone help you when you read instructions, pamphlets, or other written materials from your doctor or pharmacy?: 1 - Never What is the last grade level you completed in school?: 12th grade  Diabetic?No  Interpreter Needed?: No  Information entered by :: [place] of Daily Living In your present state of health, do you have any difficulty performing the following activities: 02/06/2020 12/07/2019  Hearing? Y N  Comment Has issues sometimes hearing what people are saying  during conversation.States sounds like the people are muffled -  Vision? Y N  Comment Has difficulty seeing only at night even if wearing glasses -  Difficulty concentrating or making decisions? N N  Walking or climbing stairs? N N  Dressing or bathing? N N  Doing errands, shopping? N N  Preparing Food and eating ? N -  Using the Toilet? N -  In the past six months, have you accidently leaked urine? Y -  Comment has occassional bladder leakage to urgency -  Do you have problems with loss of bowel control? N -  Managing your Medications? N -  Managing your Finances? N -  Housekeeping or managing your Housekeeping? N -  Some recent data might be hidden    Patient Care Team: Laurey Morale, MD as PCP - [facility], Eppie Gibson, RN as [place] any recent Medical Services you may have received from other than [facility] providers in the past year (date may be approximate).     Assessment:   This is a routine wellness examination for Taliya.  Hearing/Vision screen  Hearing Screening   125Hz  250Hz  500Hz  1000Hz  2000Hz  3000Hz  4000Hz  6000Hz  8000Hz   Right ear:           Left ear:           Vision Screening Comments: Patient states gets eyes checked annually    Dietary issues and exercise activities discussed: Current Exercise Habits: Home exercise routine, Type of exercise: walking, Time (Minutes): 35, Frequency (Times/Week): 7, Weekly Exercise (Minutes/Week): 245, Intensity: Mild  Goals    . Patient Stated     I will continue to walk 2 miles per [place]       Depression Screen PHQ 2/9 Scores 02/06/2020 05/19/2019 11/16/2017  PHQ - 2 Score 0 0 0  PHQ- 9 Score 0 - -    Fall Risk Fall Risk  02/06/2020 05/19/2019 11/16/2017  Falls in the past year? 0 0 No  Number falls in past yr: 0 - -  Injury with Fall? 0 - -  Risk for fall due to : No Fall Risks - -  Follow up Falls evaluation completed;Falls prevention discussed - -    Any stairs in or around  the home? No  If so, are there any without handrails? No  Home free of loose throw rugs  in walkways, pet beds, electrical cords, etc? Yes  Adequate lighting in your home to reduce risk of falls? Yes   ASSISTIVE DEVICES UTILIZED TO PREVENT FALLS:  Life alert? No  Use of a cane, walker or w/c? No  Grab bars in the bathroom? No  Shower chair or bench in shower? No  Elevated toilet seat or a handicapped toilet? No     Cognitive Function:  Cognitive screening not indicated based on direct observation       Immunizations Immunization History  Administered Date(s) Administered  . Influenza-Unspecified 01/21/2017  . PFIZER SARS-COV-2 Vaccination 06/23/2019, 07/15/2019  . Pneumococcal Conjugate-13 07/24/2014  . Pneumococcal Polysaccharide-23 11/16/2017  . Tdap 07/24/2014  . Zoster 08/15/2014    TDAP status: Up to date Flu Vaccine status: Declined, Education has been provided regarding the importance of this vaccine but patient still declined. Advised may receive this vaccine at local pharmacy or Health Dept. Aware to provide a copy of the vaccination record if obtained from local pharmacy or Health Dept. Verbalized acceptance and understanding. Pneumococcal vaccine status: Up to date Covid-19 vaccine status: Completed vaccines  Qualifies for Shingles Vaccine? Yes   Zostavax completed Yes   Shingrix Completed?: No.    Education has been provided regarding the importance of this vaccine. Patient has been advised to call insurance company to determine out of pocket expense if they have not yet received this vaccine. Advised may also receive vaccine at local pharmacy or Health Dept. Verbalized acceptance and understanding.  Screening Tests Health Maintenance  Topic Date Due  . DEXA SCAN  Never done  . INFLUENZA VACCINE  12/10/2019  . MAMMOGRAM  04/06/2020  . TETANUS/TDAP  07/23/2024  . COLONOSCOPY  08/28/2029  . COVID-19 Vaccine  Completed  . Hepatitis C Screening  Completed  .  PNA vac Low Risk Adult  Completed    Health Maintenance  Health Maintenance Due  Topic Date Due  . DEXA SCAN  Never done  . INFLUENZA VACCINE  12/10/2019    Colorectal cancer screening: Completed 08/29/2019. Repeat every 10 years Mammogram status: Ordered 02/06/2020. Pt provided with contact info and advised to call to schedule appt.  Bone Density status: Ordered 02/06/2020. Pt provided with contact info and advised to call to schedule appt.  Lung Cancer Screening: (Low Dose CT Chest recommended if Age 76-80 years, 30 pack-year currently smoking OR have quit w/in 15years.) does not qualify.   Lung Cancer Screening Referral: N/A   Additional Screening:  Hepatitis C Screening: does qualify; Completed 08/04/2016  Vision Screening: Recommended annual ophthalmology exams for early detection of glaucoma and other disorders of the eye. Is the patient up to date with their annual eye exam?  Yes  Who is the provider or what is the name of the office in which the patient attends annual eye exams? [facility]  If pt is not established with a provider, would they like to be referred to a provider to establish care? No .   Dental Screening: Recommended annual dental exams for proper oral hygiene  Community Resource Referral / Chronic Care Management: CRR required this visit?  No   CCM required this visit?  No      Plan:     I have personally reviewed and noted the following in the patient's chart:   . Medical and social history . Use of alcohol, tobacco or illicit drugs  . Current medications and supplements . Functional ability and status . Nutritional status . Physical activity . Advanced  directives . List of other physicians . Hospitalizations, surgeries, and ER visits in previous 12 months . Vitals . Screenings to include cognitive, depression, and falls . Referrals and appointments  In addition, I have reviewed and discussed with patient certain preventive  protocols, quality metrics, and best practice recommendations. A written personalized care plan for preventive services as well as general preventive health recommendations were provided to patient.     Ofilia Neas, LPN   [id number]   Nurse Notes:

## 2020-02-06 NOTE — Addendum Note (Signed)
Addended by: Ofilia Neas R on: 02/06/2020 02:00 PM   Modules accepted: Orders

## 2020-02-06 NOTE — Patient Instructions (Signed)
[name] , Thank you for taking time to come for your Medicare Wellness Visit. I appreciate your ongoing commitment to your health goals. Please review the following plan we discussed and let me know if I can assist you in the future.   Screening recommendations/referrals: Colonoscopy: Up to date, next due 08/28/2029 Mammogram: Currently due, orders placed this visit Bone Density: Currently due, orders placed this visit  Recommended yearly ophthalmology/optometry visit for glaucoma screening and checkup Recommended yearly dental visit for hygiene and checkup  Vaccinations: Influenza vaccine: Currently due, you may receive in our office or at your local pharmacy  Pneumococcal vaccine: Completed series Tdap vaccine: Up to date, next due 07/23/2024 Shingles vaccine: Currently due for shingrix, please contact your pharmacy to discuss cost and to receive vaccines    Advanced directives: Copies on file   Conditions/risks identified: None   Next appointment: None    Preventive Care 65 Years and Older, Female Preventive care refers to lifestyle choices and visits with your health care provider that can promote health and wellness. What does preventive care include?  A yearly physical exam. This is also called an annual well check.  Dental exams once or twice a year.  Routine eye exams. Ask your health care provider how often you should have your eyes checked.  Personal lifestyle choices, including:  Daily care of your teeth and gums.  Regular physical activity.  Eating a healthy diet.  Avoiding tobacco and drug use.  Limiting alcohol use.  Practicing safe sex.  Taking low-dose aspirin every [place].  Taking vitamin and mineral supplements as recommended by your health care provider. What happens during an annual well check? The services and screenings done by your health care provider during your annual well check will depend on your age, overall health, lifestyle risk factors,  and family history of disease. Counseling  Your health care provider may ask you questions about your:  Alcohol use.  Tobacco use.  Drug use.  Emotional well-being.  Home and relationship well-being.  Sexual activity.  Eating habits.  History of falls.  Memory and ability to understand (cognition).  Work and work Statistician.  Reproductive health. Screening  You may have the following tests or measurements:  Height, weight, and BMI.  Blood pressure.  Lipid and cholesterol levels. These may be checked every 5 years, or more frequently if you are over 83 years old.  Skin check.  Lung cancer screening. You may have this screening every year starting at age 62 if you have a 30-pack-year history of smoking and currently smoke or have quit within the past 15 years.  Fecal occult blood test (FOBT) of the stool. You may have this test every year starting at age 40.  Flexible sigmoidoscopy or colonoscopy. You may have a sigmoidoscopy every 5 years or a colonoscopy every 10 years starting at age 21.  Hepatitis C blood test.  Hepatitis B blood test.  Sexually transmitted disease (STD) testing.  Diabetes screening. This is done by checking your blood sugar (glucose) after you have not eaten for a while (fasting). You may have this done every 1-3 years.  Bone density scan. This is done to screen for osteoporosis. You may have this done starting at age 35.  Mammogram. This may be done every 1-2 years. Talk to your health care provider about how often you should have regular mammograms. Talk with your health care provider about your test results, treatment options, and if necessary, the need for more tests. Vaccines  Your health care provider may recommend certain vaccines, such as:  Influenza vaccine. This is recommended every year.  Tetanus, diphtheria, and acellular pertussis (Tdap, Td) vaccine. You may need a Td booster every 10 years.  Zoster vaccine. You may need  this after age 33.  Pneumococcal 13-valent conjugate (PCV13) vaccine. One dose is recommended after age 32.  Pneumococcal polysaccharide (PPSV23) vaccine. One dose is recommended after age 31. Talk to your health care provider about which screenings and vaccines you need and how often you need them. This information is not intended to replace advice given to you by your health care provider. Make sure you discuss any questions you have with your health care provider. Document Released: 05/24/2015 Document Revised: 01/15/2016 Document Reviewed: 02/26/2015 Elsevier Interactive Patient Education  2017 [place] Prevention in the Home Falls can cause injuries. They can happen to people of all ages. There are many things you can do to make your home safe and to help prevent falls. What can I do on the outside of my home?  Regularly fix the edges of walkways and driveways and fix any cracks.  Remove anything that might make you trip as you walk through a door, such as a raised step or threshold.  Trim any bushes or trees on the path to your home.  Use bright outdoor lighting.  Clear any walking paths of anything that might make someone trip, such as rocks or tools.  Regularly check to see if handrails are loose or broken. Make sure that both sides of any steps have handrails.  Any raised decks and porches should have guardrails on the edges.  Have any leaves, snow, or ice cleared regularly.  Use sand or salt on walking paths during winter.  Clean up any spills in your garage right away. This includes oil or grease spills. What can I do in the bathroom?  Use night lights.  Install grab bars by the toilet and in the tub and shower. Do not use towel bars as grab bars.  Use non-skid mats or decals in the tub or shower.  If you need to sit down in the shower, use a plastic, non-slip stool.  Keep the floor dry. Clean up any water that spills on the floor as soon as it  happens.  Remove soap buildup in the tub or shower regularly.  Attach bath mats securely with double-sided non-slip rug tape.  Do not have throw rugs and other things on the floor that can make you trip. What can I do in the bedroom?  Use night lights.  Make sure that you have a light by your bed that is easy to reach.  Do not use any sheets or blankets that are too big for your bed. They should not hang down onto the floor.  Have a firm chair that has side arms. You can use this for support while you get dressed.  Do not have throw rugs and other things on the floor that can make you trip. What can I do in the kitchen?  Clean up any spills right away.  Avoid walking on wet floors.  Keep items that you use a lot in easy-to-reach places.  If you need to reach something above you, use a strong step stool that has a grab bar.  Keep electrical cords out of the way.  Do not use floor polish or wax that makes floors slippery. If you must use wax, use non-skid floor wax.  Do  not have throw rugs and other things on the floor that can make you trip. What can I do with my stairs?  Do not leave any items on the stairs.  Make sure that there are handrails on both sides of the stairs and use them. Fix handrails that are broken or loose. Make sure that handrails are as long as the stairways.  Check any carpeting to make sure that it is firmly attached to the stairs. Fix any carpet that is loose or worn.  Avoid having throw rugs at the top or bottom of the stairs. If you do have throw rugs, attach them to the floor with carpet tape.  Make sure that you have a light switch at the top of the stairs and the bottom of the stairs. If you do not have them, ask someone to add them for you. What else can I do to help prevent falls?  Wear shoes that:  Do not have high heels.  Have rubber bottoms.  Are comfortable and fit you well.  Are closed at the toe. Do not wear sandals.  If you  use a stepladder:  Make sure that it is fully opened. Do not climb a closed stepladder.  Make sure that both sides of the stepladder are locked into place.  Ask someone to hold it for you, if possible.  Clearly mark and make sure that you can see:  Any grab bars or handrails.  First and last steps.  Where the edge of each step is.  Use tools that help you move around (mobility aids) if they are needed. These include:  Canes.  Walkers.  Scooters.  Crutches.  Turn on the lights when you go into a dark area. Replace any light bulbs as soon as they burn out.  Set up your furniture so you have a clear path. Avoid moving your furniture around.  If any of your floors are uneven, fix them.  If there are any pets around you, be aware of where they are.  Review your medicines with your doctor. Some medicines can make you feel dizzy. This can increase your chance of falling. Ask your doctor what other things that you can do to help prevent falls. This information is not intended to replace advice given to you by your health care provider. Make sure you discuss any questions you have with your health care provider. Document Released: 02/21/2009 Document Revised: 10/03/2015 Document Reviewed: 06/01/2014 Elsevier Interactive Patient Education  2017 [employer].

## 2020-02-20 ENCOUNTER — Other Ambulatory Visit: Payer: Self-pay | Admitting: *Deleted

## 2020-02-20 NOTE — Patient Outreach (Signed)
[facility]) Care Management  02/20/2020  Elanah Osmanovic [place] [birth year] [id number]   RN Health Coach attempted follow up outreach call to patient.  Patient was unavailable. HIPPA compliance voicemail message left with return callback number.  Plan: RN will call patient again within 30 days. Unsuccessful outreach letter sent  [place] Management [phone]

## 2020-03-14 ENCOUNTER — Other Ambulatory Visit: Payer: Self-pay | Admitting: *Deleted

## 2020-03-14 NOTE — Patient Outreach (Signed)
[place]) Care Management  03/14/2020  [name] 06-14-1950 [id number]  RN Health Coach attempted follow up outreach call to patient.  Patient was unavailable. HIPPA compliance voicemail message left with return callback number.  Plan: RN will call patient again within 30 days.  [employer] Care Management [phone]

## 2020-03-21 ENCOUNTER — Ambulatory Visit (INDEPENDENT_AMBULATORY_CARE_PROVIDER_SITE_OTHER): Payer: Medicare HMO | Admitting: Family Medicine

## 2020-03-21 ENCOUNTER — Encounter: Payer: Self-pay | Admitting: Family Medicine

## 2020-03-21 ENCOUNTER — Other Ambulatory Visit: Payer: Self-pay

## 2020-03-21 VITALS — BP 124/68 | HR 72 | Temp 98.5°F | Ht 64.0 in | Wt 196.8 lb

## 2020-03-21 DIAGNOSIS — M5030 Other cervical disc degeneration, unspecified cervical region: Secondary | ICD-10-CM | POA: Diagnosis not present

## 2020-03-21 DIAGNOSIS — F1190 Opioid use, unspecified, uncomplicated: Secondary | ICD-10-CM

## 2020-03-21 DIAGNOSIS — R69 Illness, unspecified: Secondary | ICD-10-CM | POA: Diagnosis not present

## 2020-03-21 MED ORDER — HYDROCODONE-ACETAMINOPHEN 10-325 MG PO TABS: 1.0000 | ORAL_TABLET | Freq: Three times a day (TID) | ORAL | 0 refills | Status: DC | PRN

## 2020-03-21 MED ORDER — METOPROLOL SUCCINATE ER 100 MG PO TB24: 100.0000 mg | ORAL_TABLET | Freq: Two times a day (BID) | ORAL | 3 refills | Status: DC

## 2020-03-21 NOTE — Progress Notes (Signed)
   Subjective:    Patient ID: [name], female    DOB: 02/25/1951, 69 y.o.   MRN: [id number]  HPI Here for pain management, she is doing well.  Indication for chronic opioid: neck pain Medication and dose: Norco 10-325 # pills per month: 90 Last UDS date: 09-20-19 Opioid Treatment Agreement signed (Y/N): 06-03-18 Opioid Treatment Agreement last reviewed with patient:  03-21-20 NCCSRS reviewed this encounter (include red flags): Yes    Review of Systems     Objective:   Physical Exam        Assessment & Plan:  Pain management, meds were refilled.  Alysia Penna, MD

## 2020-03-31 IMAGING — RF DG BE THRU COLOSTOMY
8 series · 14 of 24 positions shown · non-contrast
Comparison: 04/13/2019 CT abdomen/pelvis.

CLINICAL DATA: Status post sigmoid colectomy with end colostomy and
Hartmann's pouch 04/14/2019, now presenting for evaluation prior to
colostomy takedown.

EXAM:
WATER SOLUBLE CONTRAST ENEMA
TECHNIQUE: Water soluble contrast evaluation of the Hartmann's pouch performed
initially through retrograde installation via rectal catheter.
Subsequent water-soluble contrast evaluation of remnant colon
performed insert retrograde installation through the end colostomy
via a foley catheter.
FLUOROSCOPY TIME:  Fluoroscopy Time:  3 minutes 18 seconds
Number of Acquired Spot Images: 19

[Series 1: one shot · 0.14mm/px · 1 of 2 slices shown (1 of 3)]
[im 1/2]
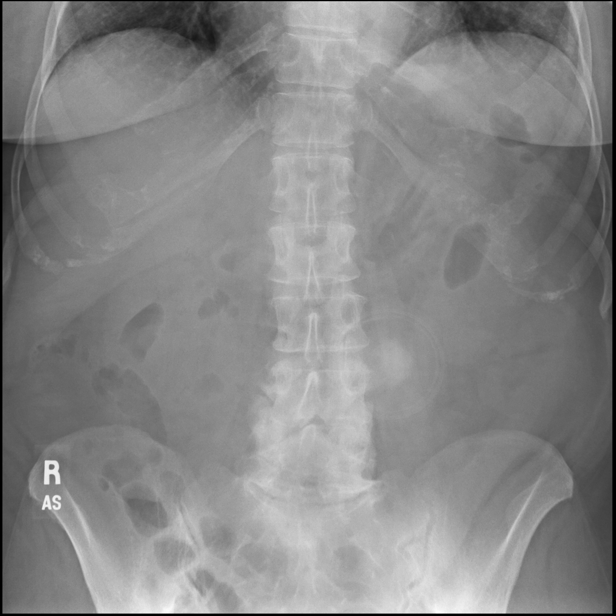

[Series 2: sequence · 1 of 16 frames shown (1 of 5)]
[frame 12/16]
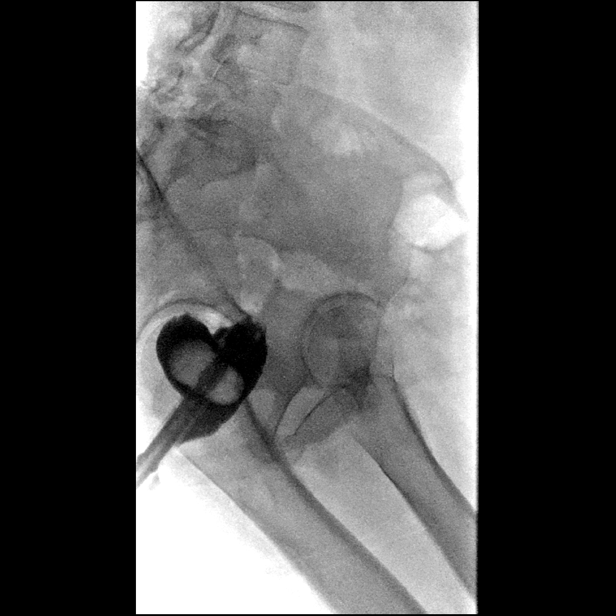

[Series 3: sequence · 1 of 15 frames shown (2 of 5)]
[frame 8/15]
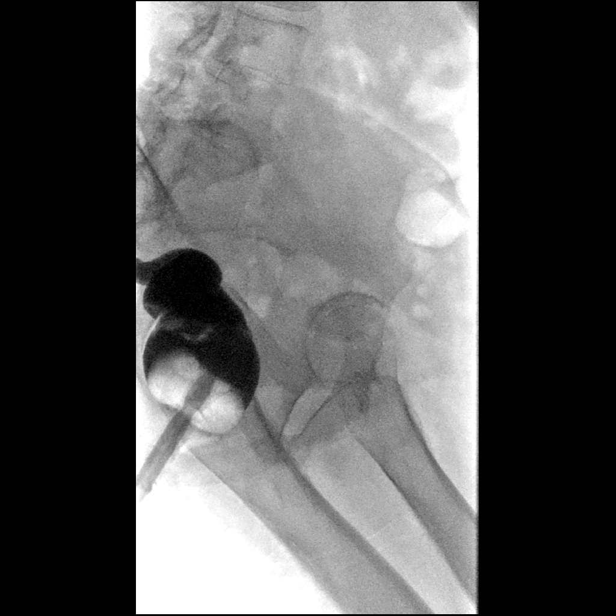

[Series 4: sequence · 2 of 27 frames shown (3 of 5)]
[frame 14/27]
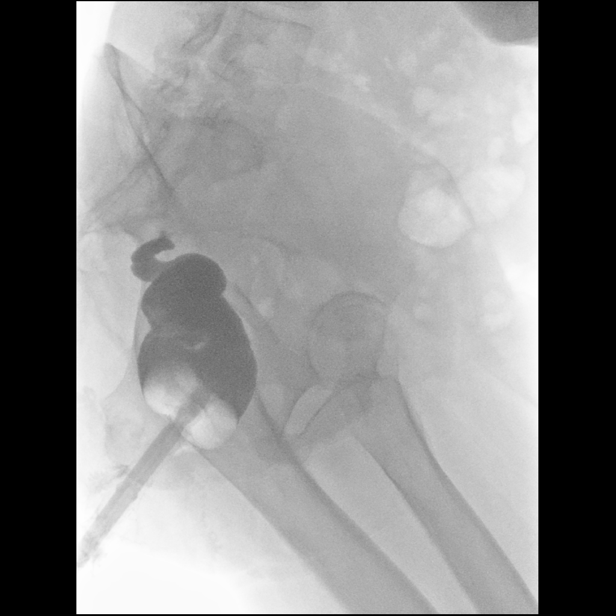
[frame 23/27]
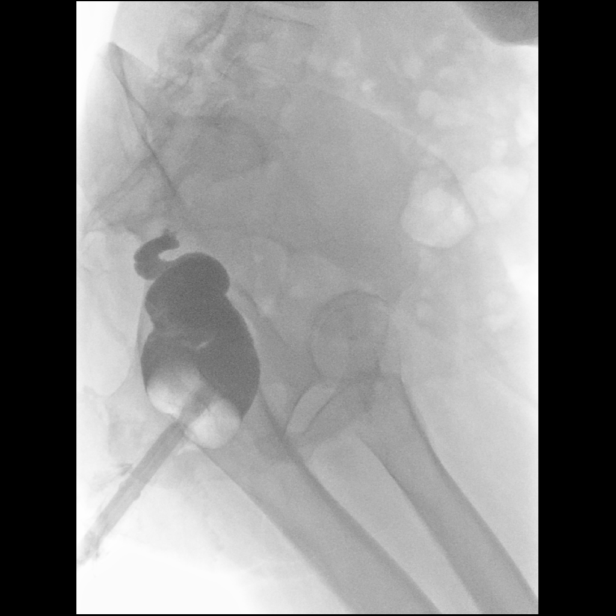

[Series 5: one shot · 0.16mm/px · 2 of 6 slices shown (2 of 3)]
[im 2/6]
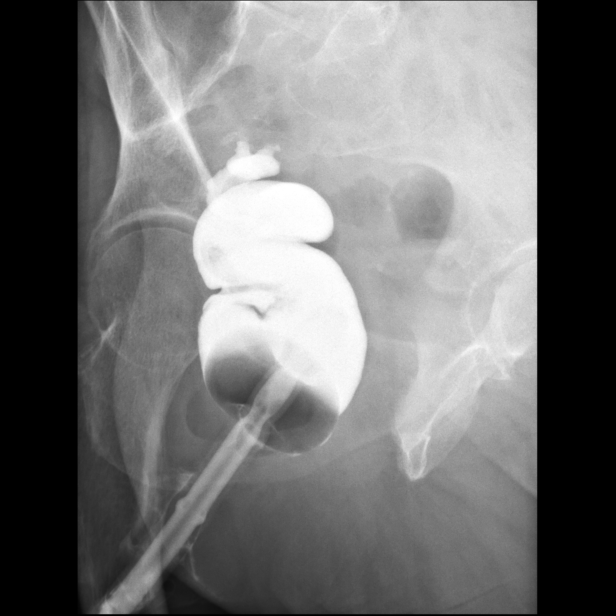
[im 6/6]
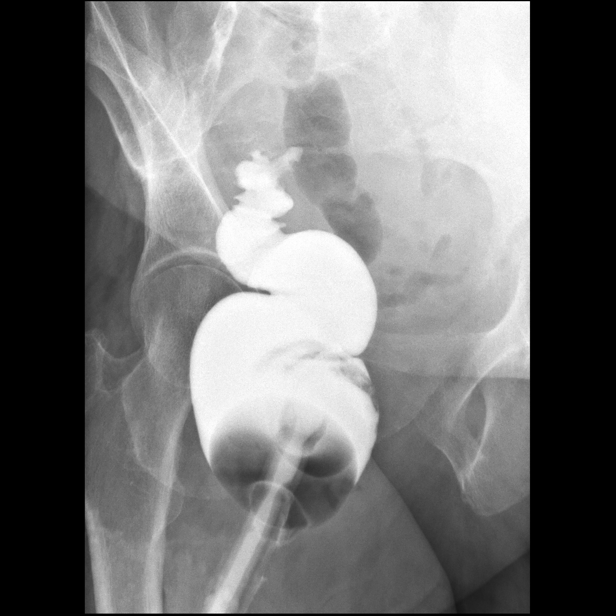

[Series 6: sequence · 1 of 6 frames shown (4 of 5)]
[frame 1/6]
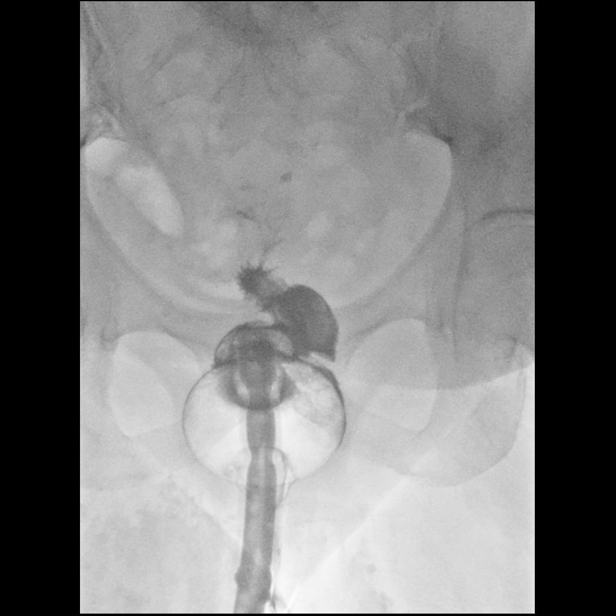

[Series 8: sequence · 2 of 31 frames shown (5 of 5)]
[frame 1/31]
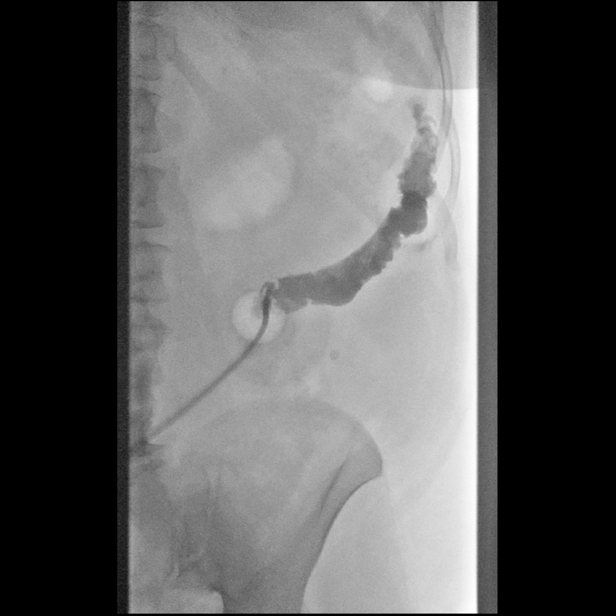
[frame 27/31]
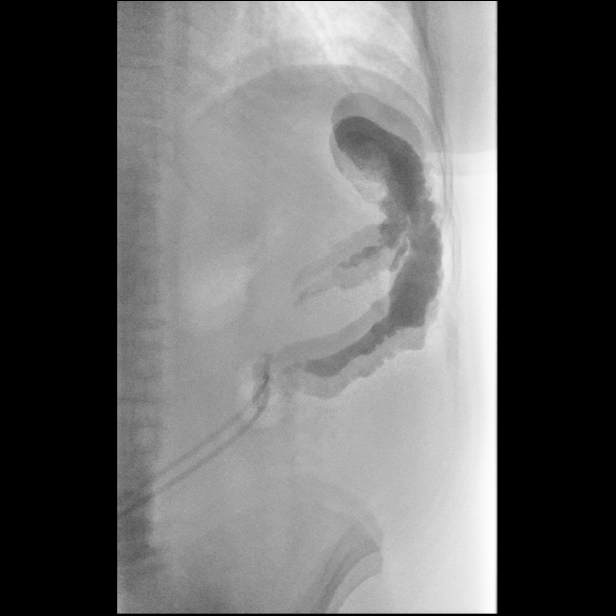

[Series 9: one shot · 0.15mm/px · 4 of 12 slices shown (3 of 3)]
[im 3/12]
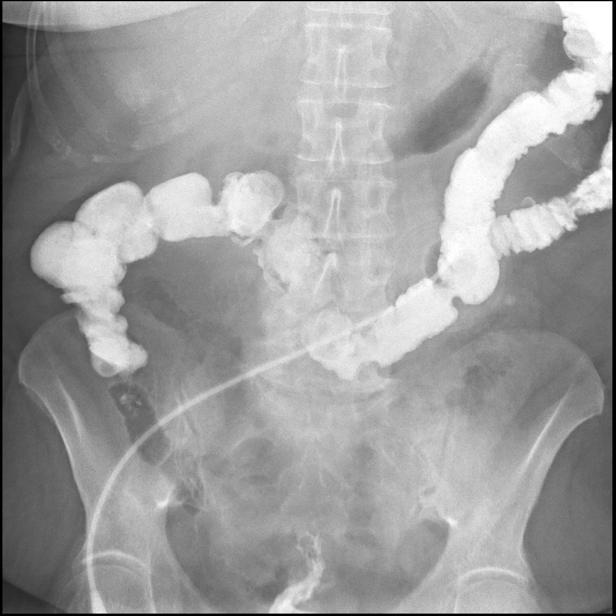
[im 5/12]
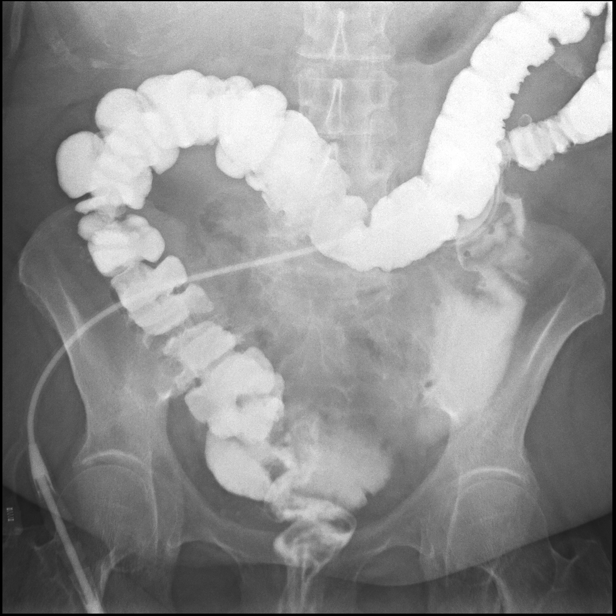
[im 8/12]
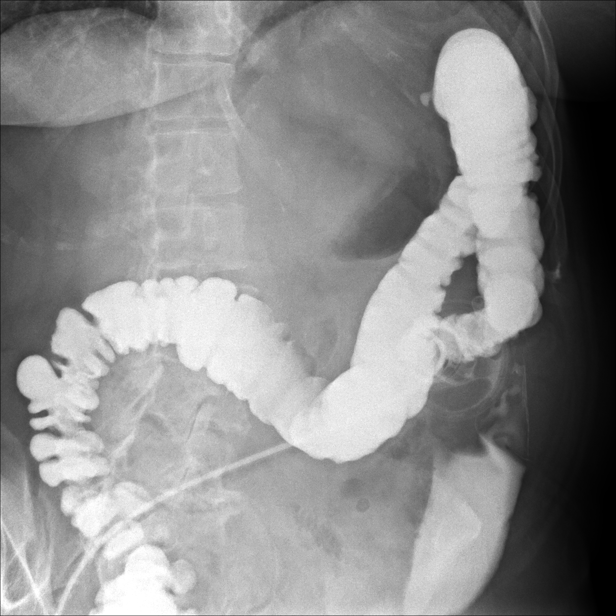
[im 12/12]
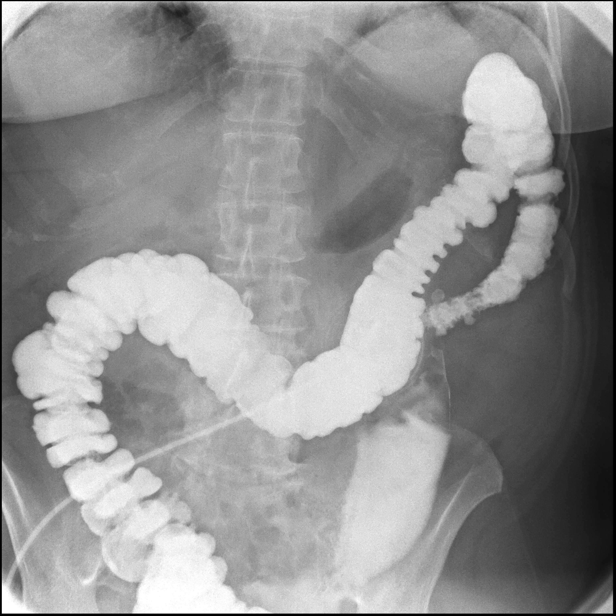

[14 of 24 positions shown; findings below may reference images not displayed]

FINDINGS: Normal appearance of the Hartmann's pouch with no significant
filling defects. No contrast leak or fistula.

Contrast instilled in retrograde fashion through the colostomy to
the level of the cecum without difficulty. A few scattered
diverticula are present in the remnant left colon. No evidence of
colonic strictures, significant filling defects, wall thickening or
fistula.
IMPRESSION: 1. Normal appearance of the Hartmann's pouch, with no leak or
fistula.
2. Minimal colonic diverticulosis in the remnant left colon.
Otherwise normal colostomy enema of the remnant colon, with no
strictures, significant filling defects or fistula.

## 2020-04-02 ENCOUNTER — Ambulatory Visit
Admission: RE | Admit: 2020-04-02 | Discharge: 2020-04-02 | Disposition: A | Discharge status: Home / Self Care | Payer: Medicare HMO | Source: Ambulatory Visit | Attending: Family Medicine | Admitting: Family Medicine

## 2020-04-02 ENCOUNTER — Other Ambulatory Visit: Payer: Self-pay

## 2020-04-02 DIAGNOSIS — Z1231 Encounter for screening mammogram for malignant neoplasm of breast: Secondary | ICD-10-CM | POA: Diagnosis not present

## 2020-05-01 ENCOUNTER — Encounter: Payer: Medicare HMO | Admitting: Family Medicine

## 2020-05-20 DIAGNOSIS — Z20822 Contact with and (suspected) exposure to covid-19: Secondary | ICD-10-CM | POA: Diagnosis not present

## 2020-05-23 ENCOUNTER — Other Ambulatory Visit: Payer: Self-pay

## 2020-05-24 ENCOUNTER — Other Ambulatory Visit: Payer: Self-pay

## 2020-05-24 ENCOUNTER — Ambulatory Visit (INDEPENDENT_AMBULATORY_CARE_PROVIDER_SITE_OTHER): Payer: Medicare HMO | Admitting: Family Medicine

## 2020-05-24 ENCOUNTER — Encounter: Payer: Self-pay | Admitting: Family Medicine

## 2020-05-24 VITALS — BP 120/70 | HR 87 | Temp 98.7°F | Ht 63.5 in | Wt 192.2 lb

## 2020-05-24 DIAGNOSIS — M5030 Other cervical disc degeneration, unspecified cervical region: Secondary | ICD-10-CM | POA: Diagnosis not present

## 2020-05-24 DIAGNOSIS — F1190 Opioid use, unspecified, uncomplicated: Secondary | ICD-10-CM

## 2020-05-24 DIAGNOSIS — R69 Illness, unspecified: Secondary | ICD-10-CM | POA: Diagnosis not present

## 2020-05-24 MED ORDER — HYDROCODONE-ACETAMINOPHEN 10-325 MG PO TABS: 1.0000 | ORAL_TABLET | Freq: Three times a day (TID) | ORAL | 0 refills | Status: DC | PRN

## 2020-05-24 MED ORDER — HYDROCODONE-ACETAMINOPHEN 10-325 MG PO TABS
1.0000 | ORAL_TABLET | Freq: Three times a day (TID) | ORAL | 0 refills | Status: DC | PRN
Start: 2020-06-20 — End: 2020-05-24

## 2020-05-24 MED ORDER — HYDROCHLOROTHIAZIDE 25 MG PO TABS: 25.0000 mg | ORAL_TABLET | Freq: Every day | ORAL | 3 refills | Status: DC

## 2020-05-24 NOTE — Progress Notes (Signed)
   Subjective:    Patient ID: [name], female    DOB: March 13, 1951, 70 y.o.   MRN: [id number]  HPI Here for pain management, she is doing well.  Indication for chronic opioid: neck pain Medication and dose: Norco 10-325 # pills per month: 90 Last UDS date: 09-20-19 Opioid Treatment Agreement signed (Y/N): 06-03-18 Opioid Treatment Agreement last reviewed with patient:  05-24-20 NCCSRS reviewed this encounter (include red flags): Yes    Review of Systems     Objective:   Physical Exam        Assessment & Plan:  Pain management, meds were refilled.  Alysia Penna, MD

## 2020-06-19 ENCOUNTER — Other Ambulatory Visit: Payer: Self-pay | Admitting: Family Medicine

## 2020-06-19 DIAGNOSIS — E2839 Other primary ovarian failure: Secondary | ICD-10-CM

## 2020-06-24 ENCOUNTER — Other Ambulatory Visit: Payer: Medicare HMO

## 2020-06-25 ENCOUNTER — Encounter: Payer: Self-pay | Admitting: Family Medicine

## 2020-06-25 ENCOUNTER — Other Ambulatory Visit: Payer: Self-pay

## 2020-06-25 ENCOUNTER — Ambulatory Visit (INDEPENDENT_AMBULATORY_CARE_PROVIDER_SITE_OTHER): Payer: Medicare HMO | Admitting: Family Medicine

## 2020-06-25 VITALS — BP 122/68 | HR 81 | Temp 98.4°F | Ht 63.5 in | Wt 192.6 lb

## 2020-06-25 DIAGNOSIS — G8929 Other chronic pain: Secondary | ICD-10-CM

## 2020-06-25 DIAGNOSIS — M545 Low back pain, unspecified: Secondary | ICD-10-CM | POA: Diagnosis not present

## 2020-06-25 DIAGNOSIS — M546 Pain in thoracic spine: Secondary | ICD-10-CM | POA: Diagnosis not present

## 2020-06-25 DIAGNOSIS — M9903 Segmental and somatic dysfunction of lumbar region: Secondary | ICD-10-CM | POA: Diagnosis not present

## 2020-06-25 DIAGNOSIS — M5441 Lumbago with sciatica, right side: Secondary | ICD-10-CM | POA: Diagnosis not present

## 2020-06-25 DIAGNOSIS — M5030 Other cervical disc degeneration, unspecified cervical region: Secondary | ICD-10-CM | POA: Diagnosis not present

## 2020-06-25 DIAGNOSIS — M62830 Muscle spasm of back: Secondary | ICD-10-CM | POA: Diagnosis not present

## 2020-06-25 DIAGNOSIS — M62838 Other muscle spasm: Secondary | ICD-10-CM | POA: Diagnosis not present

## 2020-06-25 DIAGNOSIS — M9902 Segmental and somatic dysfunction of thoracic region: Secondary | ICD-10-CM | POA: Diagnosis not present

## 2020-06-25 DIAGNOSIS — M9901 Segmental and somatic dysfunction of cervical region: Secondary | ICD-10-CM | POA: Diagnosis not present

## 2020-06-25 DIAGNOSIS — M542 Cervicalgia: Secondary | ICD-10-CM | POA: Diagnosis not present

## 2020-06-25 MED ORDER — CYCLOBENZAPRINE HCL 10 MG PO TABS: 10.0000 mg | ORAL_TABLET | Freq: Three times a day (TID) | ORAL | 2 refills | Status: DC | PRN

## 2020-06-25 NOTE — Progress Notes (Signed)
   Subjective:    Patient ID: Einar Gip Clason, female    DOB: Dec 04, 1950, 70 y.o.   MRN: [id number]  HPI Here for 6 months of worsening right sided low back pain which radiates down the right leg. No hx of trauma. She has has numbness, tingling, and weakness in the right leg. She takes Norco on a regular basis for neck pain. Heat has helped her back.    Review of Systems  Constitutional: Negative.   Respiratory: Negative.   Cardiovascular: Negative.   Musculoskeletal: Positive for back pain.       Objective:   Physical Exam Constitutional:      Comments: In pain   Cardiovascular:     Rate and Rhythm: Normal rate and regular rhythm.     Pulses: Normal pulses.     Heart sounds: Normal heart sounds.  Pulmonary:     Effort: Pulmonary effort is normal.     Breath sounds: Normal breath sounds.  Musculoskeletal:     Comments: She is tender over the lower back, especially on the right side. She is also tender over the right sciatic notch. ROM is full, but SLR is positive on the right.   Neurological:     Mental Status: She is alert.           Assessment & Plan:  Right sided low back pain with sciatica. She will use heat and Flexeril as needed. Set up a lumbar MRI soon.  Alysia Penna, MD

## 2020-06-27 DIAGNOSIS — M545 Low back pain, unspecified: Secondary | ICD-10-CM | POA: Diagnosis not present

## 2020-06-27 DIAGNOSIS — M546 Pain in thoracic spine: Secondary | ICD-10-CM | POA: Diagnosis not present

## 2020-06-27 DIAGNOSIS — M9903 Segmental and somatic dysfunction of lumbar region: Secondary | ICD-10-CM | POA: Diagnosis not present

## 2020-06-27 DIAGNOSIS — M9901 Segmental and somatic dysfunction of cervical region: Secondary | ICD-10-CM | POA: Diagnosis not present

## 2020-06-27 DIAGNOSIS — M62830 Muscle spasm of back: Secondary | ICD-10-CM | POA: Diagnosis not present

## 2020-06-27 DIAGNOSIS — M9902 Segmental and somatic dysfunction of thoracic region: Secondary | ICD-10-CM | POA: Diagnosis not present

## 2020-06-27 DIAGNOSIS — M542 Cervicalgia: Secondary | ICD-10-CM | POA: Diagnosis not present

## 2020-06-27 DIAGNOSIS — M62838 Other muscle spasm: Secondary | ICD-10-CM | POA: Diagnosis not present

## 2020-07-01 DIAGNOSIS — M545 Low back pain, unspecified: Secondary | ICD-10-CM | POA: Diagnosis not present

## 2020-07-01 DIAGNOSIS — M9901 Segmental and somatic dysfunction of cervical region: Secondary | ICD-10-CM | POA: Diagnosis not present

## 2020-07-01 DIAGNOSIS — M62830 Muscle spasm of back: Secondary | ICD-10-CM | POA: Diagnosis not present

## 2020-07-01 DIAGNOSIS — M542 Cervicalgia: Secondary | ICD-10-CM | POA: Diagnosis not present

## 2020-07-01 DIAGNOSIS — M62838 Other muscle spasm: Secondary | ICD-10-CM | POA: Diagnosis not present

## 2020-07-01 DIAGNOSIS — M9902 Segmental and somatic dysfunction of thoracic region: Secondary | ICD-10-CM | POA: Diagnosis not present

## 2020-07-01 DIAGNOSIS — M546 Pain in thoracic spine: Secondary | ICD-10-CM | POA: Diagnosis not present

## 2020-07-01 DIAGNOSIS — M9903 Segmental and somatic dysfunction of lumbar region: Secondary | ICD-10-CM | POA: Diagnosis not present

## 2020-07-03 DIAGNOSIS — M62838 Other muscle spasm: Secondary | ICD-10-CM | POA: Diagnosis not present

## 2020-07-03 DIAGNOSIS — M9903 Segmental and somatic dysfunction of lumbar region: Secondary | ICD-10-CM | POA: Diagnosis not present

## 2020-07-03 DIAGNOSIS — M62830 Muscle spasm of back: Secondary | ICD-10-CM | POA: Diagnosis not present

## 2020-07-03 DIAGNOSIS — M546 Pain in thoracic spine: Secondary | ICD-10-CM | POA: Diagnosis not present

## 2020-07-03 DIAGNOSIS — M9901 Segmental and somatic dysfunction of cervical region: Secondary | ICD-10-CM | POA: Diagnosis not present

## 2020-07-03 DIAGNOSIS — M545 Low back pain, unspecified: Secondary | ICD-10-CM | POA: Diagnosis not present

## 2020-07-03 DIAGNOSIS — M542 Cervicalgia: Secondary | ICD-10-CM | POA: Diagnosis not present

## 2020-07-03 DIAGNOSIS — M9902 Segmental and somatic dysfunction of thoracic region: Secondary | ICD-10-CM | POA: Diagnosis not present

## 2020-07-08 ENCOUNTER — Other Ambulatory Visit: Payer: Self-pay | Admitting: *Deleted

## 2020-07-08 NOTE — Patient Outreach (Addendum)
[facility]) Care Management  07/08/2020  [name] 1950/08/04 [id number]  RN Health Coach has made 5 attempts to outreach patient. RN has sent an unsuccessful outreach letter with no response back. RN will send a closure letter. Plan: Case Closure RN sent closure letter to PCP RN sent closure letter to Patient  [place] Management [phone]

## 2020-07-09 DIAGNOSIS — M9901 Segmental and somatic dysfunction of cervical region: Secondary | ICD-10-CM | POA: Diagnosis not present

## 2020-07-09 DIAGNOSIS — M542 Cervicalgia: Secondary | ICD-10-CM | POA: Diagnosis not present

## 2020-07-09 DIAGNOSIS — M545 Low back pain, unspecified: Secondary | ICD-10-CM | POA: Diagnosis not present

## 2020-07-09 DIAGNOSIS — M62838 Other muscle spasm: Secondary | ICD-10-CM | POA: Diagnosis not present

## 2020-07-09 DIAGNOSIS — M9903 Segmental and somatic dysfunction of lumbar region: Secondary | ICD-10-CM | POA: Diagnosis not present

## 2020-07-09 DIAGNOSIS — M62830 Muscle spasm of back: Secondary | ICD-10-CM | POA: Diagnosis not present

## 2020-07-10 DIAGNOSIS — M62838 Other muscle spasm: Secondary | ICD-10-CM | POA: Diagnosis not present

## 2020-07-10 DIAGNOSIS — M542 Cervicalgia: Secondary | ICD-10-CM | POA: Diagnosis not present

## 2020-07-10 DIAGNOSIS — M9903 Segmental and somatic dysfunction of lumbar region: Secondary | ICD-10-CM | POA: Diagnosis not present

## 2020-07-10 DIAGNOSIS — M9901 Segmental and somatic dysfunction of cervical region: Secondary | ICD-10-CM | POA: Diagnosis not present

## 2020-07-10 DIAGNOSIS — M545 Low back pain, unspecified: Secondary | ICD-10-CM | POA: Diagnosis not present

## 2020-07-10 DIAGNOSIS — M62830 Muscle spasm of back: Secondary | ICD-10-CM | POA: Diagnosis not present

## 2020-07-11 DIAGNOSIS — M9903 Segmental and somatic dysfunction of lumbar region: Secondary | ICD-10-CM | POA: Diagnosis not present

## 2020-07-11 DIAGNOSIS — M545 Low back pain, unspecified: Secondary | ICD-10-CM | POA: Diagnosis not present

## 2020-07-11 DIAGNOSIS — M62838 Other muscle spasm: Secondary | ICD-10-CM | POA: Diagnosis not present

## 2020-07-11 DIAGNOSIS — M9901 Segmental and somatic dysfunction of cervical region: Secondary | ICD-10-CM | POA: Diagnosis not present

## 2020-07-11 DIAGNOSIS — M62830 Muscle spasm of back: Secondary | ICD-10-CM | POA: Diagnosis not present

## 2020-07-11 DIAGNOSIS — M542 Cervicalgia: Secondary | ICD-10-CM | POA: Diagnosis not present

## 2020-07-13 ENCOUNTER — Other Ambulatory Visit: Payer: Medicare HMO

## 2020-07-15 DIAGNOSIS — M545 Low back pain, unspecified: Secondary | ICD-10-CM | POA: Diagnosis not present

## 2020-07-15 DIAGNOSIS — M62830 Muscle spasm of back: Secondary | ICD-10-CM | POA: Diagnosis not present

## 2020-07-15 DIAGNOSIS — M9901 Segmental and somatic dysfunction of cervical region: Secondary | ICD-10-CM | POA: Diagnosis not present

## 2020-07-15 DIAGNOSIS — M62838 Other muscle spasm: Secondary | ICD-10-CM | POA: Diagnosis not present

## 2020-07-15 DIAGNOSIS — M542 Cervicalgia: Secondary | ICD-10-CM | POA: Diagnosis not present

## 2020-07-15 DIAGNOSIS — M9903 Segmental and somatic dysfunction of lumbar region: Secondary | ICD-10-CM | POA: Diagnosis not present

## 2020-07-18 DIAGNOSIS — M9901 Segmental and somatic dysfunction of cervical region: Secondary | ICD-10-CM | POA: Diagnosis not present

## 2020-07-18 DIAGNOSIS — M62830 Muscle spasm of back: Secondary | ICD-10-CM | POA: Diagnosis not present

## 2020-07-18 DIAGNOSIS — M9902 Segmental and somatic dysfunction of thoracic region: Secondary | ICD-10-CM | POA: Diagnosis not present

## 2020-07-18 DIAGNOSIS — M542 Cervicalgia: Secondary | ICD-10-CM | POA: Diagnosis not present

## 2020-07-18 DIAGNOSIS — M9903 Segmental and somatic dysfunction of lumbar region: Secondary | ICD-10-CM | POA: Diagnosis not present

## 2020-07-18 DIAGNOSIS — M545 Low back pain, unspecified: Secondary | ICD-10-CM | POA: Diagnosis not present

## 2020-07-18 DIAGNOSIS — M62838 Other muscle spasm: Secondary | ICD-10-CM | POA: Diagnosis not present

## 2020-07-24 DIAGNOSIS — M9902 Segmental and somatic dysfunction of thoracic region: Secondary | ICD-10-CM | POA: Diagnosis not present

## 2020-07-24 DIAGNOSIS — M62838 Other muscle spasm: Secondary | ICD-10-CM | POA: Diagnosis not present

## 2020-07-24 DIAGNOSIS — M9901 Segmental and somatic dysfunction of cervical region: Secondary | ICD-10-CM | POA: Diagnosis not present

## 2020-07-24 DIAGNOSIS — M62830 Muscle spasm of back: Secondary | ICD-10-CM | POA: Diagnosis not present

## 2020-07-24 DIAGNOSIS — M542 Cervicalgia: Secondary | ICD-10-CM | POA: Diagnosis not present

## 2020-07-24 DIAGNOSIS — M9903 Segmental and somatic dysfunction of lumbar region: Secondary | ICD-10-CM | POA: Diagnosis not present

## 2020-07-24 DIAGNOSIS — M545 Low back pain, unspecified: Secondary | ICD-10-CM | POA: Diagnosis not present

## 2020-07-25 DIAGNOSIS — M9903 Segmental and somatic dysfunction of lumbar region: Secondary | ICD-10-CM | POA: Diagnosis not present

## 2020-07-25 DIAGNOSIS — M9901 Segmental and somatic dysfunction of cervical region: Secondary | ICD-10-CM | POA: Diagnosis not present

## 2020-07-25 DIAGNOSIS — M9902 Segmental and somatic dysfunction of thoracic region: Secondary | ICD-10-CM | POA: Diagnosis not present

## 2020-07-25 DIAGNOSIS — M62830 Muscle spasm of back: Secondary | ICD-10-CM | POA: Diagnosis not present

## 2020-07-25 DIAGNOSIS — M62838 Other muscle spasm: Secondary | ICD-10-CM | POA: Diagnosis not present

## 2020-07-25 DIAGNOSIS — M542 Cervicalgia: Secondary | ICD-10-CM | POA: Diagnosis not present

## 2020-07-25 DIAGNOSIS — M545 Low back pain, unspecified: Secondary | ICD-10-CM | POA: Diagnosis not present

## 2020-08-01 DIAGNOSIS — M9902 Segmental and somatic dysfunction of thoracic region: Secondary | ICD-10-CM | POA: Diagnosis not present

## 2020-08-01 DIAGNOSIS — M546 Pain in thoracic spine: Secondary | ICD-10-CM | POA: Diagnosis not present

## 2020-08-01 DIAGNOSIS — M545 Low back pain, unspecified: Secondary | ICD-10-CM | POA: Diagnosis not present

## 2020-08-01 DIAGNOSIS — M9901 Segmental and somatic dysfunction of cervical region: Secondary | ICD-10-CM | POA: Diagnosis not present

## 2020-08-01 DIAGNOSIS — M9903 Segmental and somatic dysfunction of lumbar region: Secondary | ICD-10-CM | POA: Diagnosis not present

## 2020-08-01 DIAGNOSIS — M542 Cervicalgia: Secondary | ICD-10-CM | POA: Diagnosis not present

## 2020-08-12 ENCOUNTER — Ambulatory Visit (HOSPITAL_COMMUNITY)
Admission: EM | Admit: 2020-08-12 | Discharge: 2020-08-12 | Disposition: A | Discharge status: Home / Self Care | Payer: Medicare HMO

## 2020-08-12 ENCOUNTER — Other Ambulatory Visit: Payer: Self-pay

## 2020-08-12 ENCOUNTER — Emergency Department (HOSPITAL_BASED_OUTPATIENT_CLINIC_OR_DEPARTMENT_OTHER)
Admission: EM | Admit: 2020-08-12 | Discharge: 2020-08-12 | Disposition: A | Discharge status: Home / Self Care | Payer: Medicare HMO | Attending: Emergency Medicine | Admitting: Emergency Medicine

## 2020-08-12 ENCOUNTER — Encounter (HOSPITAL_BASED_OUTPATIENT_CLINIC_OR_DEPARTMENT_OTHER): Payer: Self-pay | Admitting: *Deleted

## 2020-08-12 DIAGNOSIS — Z79899 Other long term (current) drug therapy: Secondary | ICD-10-CM | POA: Insufficient documentation

## 2020-08-12 DIAGNOSIS — R1900 Intra-abdominal and pelvic swelling, mass and lump, unspecified site: Secondary | ICD-10-CM | POA: Diagnosis not present

## 2020-08-12 DIAGNOSIS — Z87891 Personal history of nicotine dependence: Secondary | ICD-10-CM | POA: Insufficient documentation

## 2020-08-12 DIAGNOSIS — R222 Localized swelling, mass and lump, trunk: Secondary | ICD-10-CM

## 2020-08-12 DIAGNOSIS — I10 Essential (primary) hypertension: Secondary | ICD-10-CM | POA: Diagnosis not present

## 2020-08-12 NOTE — Discharge Instructions (Addendum)
Schedule an appointment with your surgeons for reevaluation of the prior colonoscopy site.  As we discussed this could be a small hernia or could be just scar tissue.  Return for development of abdominal pain nausea vomiting or redness around the scar site.

## 2020-08-12 NOTE — ED Triage Notes (Signed)
Patient had a colostomy reversal in December 07, 2019 and noted that she has a lump to her left lower quadrant a week and half ago.  Denies pain, fever.

## 2020-08-12 NOTE — ED Provider Notes (Signed)
[facility] EMERGENCY DEPT Provider Note   CSN: [id number] Arrival date & time: 08/12/20  1243     History Chief Complaint  Patient presents with  . Abdominal lump    [name] is a 70 y.o. female.  Patient status post colostomy takedown in July by [facility] surgery.  For the past few days patient has felt a lump in that area.  Not associated with abdominal pain nausea or vomiting.  No redness to the skin.  Patient seems to think that it is worse with standing up.        Past Medical History:  Diagnosis Date  . Arthritis   . Cataract    bilateral repair  . Dyspnea   . Hypertension   . Neuromuscular disorder ([place])    nerve pain in neck  . Neuropathy   . Urinary incontinence   . Wears contact lenses   . Wears dentures     Patient Active Problem List   Diagnosis Date Noted  . History of colostomy reversal 12/07/2019  . Large bowel obstruction ([place]) 04/13/2019  . Irritable bowel syndrome with both constipation and diarrhea 06/11/2017  . Tobacco abuse 05/03/2012  . HTN (hypertension) 12/09/2011  . INFLAMED SEBORRHEIC KERATOSIS 04/29/2010  . 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 EDEMA 01/01/2009  . CERUMEN IMPACTION 10/19/2008  . HEADACHE 10/19/2008  . URINARY INCONTINENCE 10/19/2008    Past Surgical History:  Procedure Laterality Date  . ABDOMINAL HYSTERECTOMY     oophorectomy  . APPENDECTOMY    . CERVICAL FUSION  2009   and plating  . COLECTOMY WITH COLOSTOMY CREATION/HARTMANN PROCEDURE N/A 04/14/2019   Procedure: SIGMOID COLECTOMY, RESECTION OF SMALL BOWEL,WITH END COLOSTOMY, PROCTOSCOPY RIGID;  Surgeon: Alphonsa Overall, MD;  Location: [facility];  Service: General;  Laterality: N/A;  . COLONOSCOPY  09/20/2014   per Dr. Hilarie Fredrickson, diverticulosis and hyperplastic polyps, repeat in 5 yrs   . COLOSTOMY TAKEDOWN N/A 12/07/2019   Procedure: LAPAROSCOPIC COLOSTOMY REVERSAL;  Surgeon: Alphonsa Overall, MD;  Location: [facility];  Service:  General;  Laterality: N/A;  . CYSTOSCOPY WITH STENT PLACEMENT Bilateral 04/14/2019   Procedure: CYSTOSCOPY WITH BILATERAL STENT PLACEMENT;  Surgeon: Robley Fries, MD;  Location: [facility];  Service: Urology;  Laterality: Bilateral;  . DILATION AND CURETTAGE OF UTERUS    . EAR CYST EXCISION Right 07/05/2014   Procedure: EXCISION FLEXOR SHEATH CYST WITH RELEASE A-1 PULLEY RIGHT SMALL FINGER;  Surgeon: Leanora Cover, MD;  Location: [facility];  Service: Orthopedics;  Laterality: Right;  . SPINE SURGERY    . STOMACH SURGERY    . TRIGGER FINGER RELEASE Right 07/05/2014   Procedure: RELEASE RIGHT SMALL FINGER/A-1 PULLEY;  Surgeon: Leanora Cover, MD;  Location: [facility];  Service: Orthopedics;  Laterality: Right;  . TUBOPLASTY / TUBOTUBAL ANASTOMOSIS       OB History   No obstetric history on file.     Family History  Problem Relation Age of Onset  . Arthritis Other   . Breast cancer Other   . Hypertension Other   . Breast cancer Mother   . Colon cancer Neg Hx   . Colon polyps Neg Hx   . Esophageal cancer Neg Hx   . Stomach cancer Neg Hx   . Rectal cancer Neg Hx     Social History   Tobacco Use  . Smoking status: Former Smoker    Packs/Victorian: 0.50    Types: Cigarettes    Quit  date: 06/02/2019    Years since quitting: 1.1  . Smokeless tobacco: Never Used  . Tobacco comment: 3 weeks ago  Vaping Use  . Vaping Use: Never used  Substance Use Topics  . Alcohol use: No    Alcohol/week: 0.0 standard drinks  . Drug use: Never    Home Medications Prior to Admission medications   Medication Sig Start Date End Date Taking? Authorizing Provider  HYDROcodone-acetaminophen (NORCO) 10-325 MG tablet Take 1 tablet by mouth every 8 (eight) hours as needed for moderate pain. 08/18/20 09/17/20 Yes Laurey Morale, MD  metoprolol succinate (TOPROL-XL) 100 MG 24 hr tablet Take 1 tablet (100 mg total) by mouth in the morning and at bedtime. 03/21/20  Yes Laurey Morale, MD   acetaminophen (TYLENOL) 650 MG CR tablet Take 650 mg by mouth every 8 (eight) hours as needed for pain.     [provider]  Ascorbic Acid (VITAMIN C PO) Take 1 tablet by mouth daily.    [provider]  cyclobenzaprine (FLEXERIL) 10 MG tablet Take 1 tablet (10 mg total) by mouth 3 (three) times daily as needed for muscle spasms. 06/25/20   Laurey Morale, MD  hydrochlorothiazide (HYDRODIURIL) 25 MG tablet Take 1 tablet (25 mg total) by mouth daily. 05/24/20   Laurey Morale, MD  oxybutynin (DITROPAN) 5 MG tablet Take 5 mg by mouth 2 (two) times daily. Patient not taking: No sig reported    [provider]    Allergies    Lisinopril, Penicillins, Egg [eggs or egg-derived products], Other, and Adhesive [tape]  Review of Systems   Review of Systems  Constitutional: Negative for chills and fever.  HENT: Negative for rhinorrhea and sore throat.   Eyes: Negative for visual disturbance.  Respiratory: Negative for cough and shortness of breath.   Cardiovascular: Negative for chest pain and leg swelling.  Gastrointestinal: Negative for abdominal pain, diarrhea, nausea and vomiting.  Genitourinary: Negative for dysuria.  Musculoskeletal: Negative for back pain and neck pain.  Skin: Negative for rash.  Neurological: Negative for dizziness, light-headedness and headaches.  Hematological: Does not bruise/bleed easily.  Psychiatric/Behavioral: Negative for confusion.    Physical Exam Updated Vital Signs BP (!) 152/104 (BP Location: Right Arm)   Pulse 91   Temp 98.3 F (36.8 C) (Oral)   Resp 16   Ht 1.626 m (5\' 4" )   Wt 87.5 kg   SpO2 99%   BMI 33.13 kg/m   Physical Exam Vitals and nursing note reviewed.  Constitutional:      General: She is not in acute distress.    Appearance: Normal appearance. She is well-developed.  HENT:     Head: Normocephalic and atraumatic.  Eyes:     Extraocular Movements: Extraocular movements intact.     Conjunctiva/sclera:  Conjunctivae normal.     Pupils: Pupils are equal, round, and reactive to light.  Cardiovascular:     Rate and Rhythm: Normal rate and regular rhythm.     Heart sounds: No murmur heard.   Pulmonary:     Effort: Pulmonary effort is normal. No respiratory distress.     Breath sounds: Normal breath sounds.  Abdominal:     Palpations: Abdomen is soft. There is no mass.     Tenderness: There is no abdominal tenderness.     Hernia: No hernia is present.     Comments: Colostomy skin incision well-healed.  No signs of infection.  No distinct evidence of an abdominal wall hernia.  Do not appreciate any defect.  But could be very small.  Can feel some hard scarring the area is nontender.  Musculoskeletal:     Cervical back: Neck supple.  Skin:    General: Skin is warm and dry.  Neurological:     Mental Status: She is alert and oriented to person, place, and time.     ED Results / Procedures / Treatments   Labs (all labs ordered are listed, but only abnormal results are displayed) Labs Reviewed - No data to display  EKG None  Radiology No results found.  Procedures Procedures   Medications Ordered in ED Medications - No data to display  ED Course  I have reviewed the triage vital signs and the nursing notes.  Pertinent labs & imaging results that were available during my care of the patient were reviewed by me and considered in my medical decision making (see chart for details).    MDM Rules/Calculators/A&P                         Possible abdominal wall hernia versus scar tissue at the colostomy takedown site.  Certainly no acute process to imply any incarcerated hernia or strangulated hernia.  Will have patient just follow back up with [facility] surgery.  Precautions provided.  Final Clinical Impression(s) / ED Diagnoses Final diagnoses:  Abdominal wall mass    Rx / DC Orders ED Discharge Orders    None       Fredia Sorrow, MD 08/12/20 1318

## 2020-08-20 ENCOUNTER — Encounter: Payer: Self-pay | Admitting: Surgery

## 2020-08-20 ENCOUNTER — Ambulatory Visit: Payer: Self-pay | Admitting: Surgery

## 2020-08-20 DIAGNOSIS — K56699 Other intestinal obstruction unspecified as to partial versus complete obstruction: Secondary | ICD-10-CM | POA: Insufficient documentation

## 2020-08-20 DIAGNOSIS — K432 Incisional hernia without obstruction or gangrene: Secondary | ICD-10-CM | POA: Diagnosis not present

## 2020-08-20 DIAGNOSIS — M542 Cervicalgia: Secondary | ICD-10-CM | POA: Insufficient documentation

## 2020-08-20 DIAGNOSIS — Z720 Tobacco use: Secondary | ICD-10-CM | POA: Diagnosis not present

## 2020-08-20 DIAGNOSIS — G8929 Other chronic pain: Secondary | ICD-10-CM | POA: Insufficient documentation

## 2020-08-20 DIAGNOSIS — F1120 Opioid dependence, uncomplicated: Secondary | ICD-10-CM | POA: Insufficient documentation

## 2020-08-20 DIAGNOSIS — Z8719 Personal history of other diseases of the digestive system: Secondary | ICD-10-CM | POA: Diagnosis not present

## 2020-08-20 DIAGNOSIS — E669 Obesity, unspecified: Secondary | ICD-10-CM | POA: Insufficient documentation

## 2020-08-20 DIAGNOSIS — M5430 Sciatica, unspecified side: Secondary | ICD-10-CM

## 2020-08-20 DIAGNOSIS — Z6830 Body mass index (BMI) 30.0-30.9, adult: Secondary | ICD-10-CM | POA: Diagnosis not present

## 2020-08-20 HISTORY — DX: Other intestinal obstruction unspecified as to partial versus complete obstruction: K56.699

## 2020-09-10 ENCOUNTER — Other Ambulatory Visit: Payer: Self-pay

## 2020-09-11 ENCOUNTER — Ambulatory Visit (INDEPENDENT_AMBULATORY_CARE_PROVIDER_SITE_OTHER): Payer: Medicare HMO | Admitting: Family Medicine

## 2020-09-11 ENCOUNTER — Encounter: Payer: Self-pay | Admitting: Family Medicine

## 2020-09-11 VITALS — BP 110/70 | HR 80 | Temp 98.6°F | Wt 195.0 lb

## 2020-09-11 DIAGNOSIS — M542 Cervicalgia: Secondary | ICD-10-CM | POA: Diagnosis not present

## 2020-09-11 DIAGNOSIS — G8929 Other chronic pain: Secondary | ICD-10-CM

## 2020-09-11 DIAGNOSIS — F1190 Opioid use, unspecified, uncomplicated: Secondary | ICD-10-CM

## 2020-09-11 DIAGNOSIS — R69 Illness, unspecified: Secondary | ICD-10-CM | POA: Diagnosis not present

## 2020-09-11 MED ORDER — HYDROCODONE-ACETAMINOPHEN 10-325 MG PO TABS: 1.0000 | ORAL_TABLET | Freq: Three times a day (TID) | ORAL | 0 refills | Status: DC | PRN

## 2020-09-11 NOTE — Progress Notes (Signed)
   Subjective:    Patient ID: [name], female    DOB: 11/22/1950, 70 y.o.   MRN: [id number]  HPI Here for pain management, she is doing well.    Review of Systems     Objective:   Physical Exam        Assessment & Plan:  Pain management. Indication for chronic opioid: neck pain Medication and dose: Norco 10-325 # pills per month: 90 Last UDS date: 09-20-19 Opioid Treatment Agreement signed (Y/N): 06-03-18 Opioid Treatment Agreement last reviewed with patient:  09-11-20 NCCSRS reviewed this encounter (include red flags): Yes Meds were refilled. Alysia Penna, MD

## 2020-09-13 LAB — DRUG MONITOR, PANEL 1, W/CONF, URINE
Amphetamines: NEGATIVE ng/mL (ref <500)
Barbiturates: NEGATIVE ng/mL (ref <300)
Benzodiazepines: NEGATIVE ng/mL (ref <100)
Cocaine Metabolite: NEGATIVE ng/mL (ref <150)
Codeine: NEGATIVE ng/mL (ref <50)
Creatinine: 183.2 mg/dL
Hydrocodone: 2312 ng/mL — ABNORMAL HIGH (ref <50)
Hydromorphone: 1383 ng/mL — ABNORMAL HIGH (ref <50)
Marijuana Metabolite: NEGATIVE ng/mL (ref <20)
Methadone Metabolite: NEGATIVE ng/mL (ref <100)
Morphine: NEGATIVE ng/mL (ref <50)
Norhydrocodone: 2811 ng/mL — ABNORMAL HIGH (ref <50)
Opiates: POSITIVE ng/mL — AB (ref <100)
Oxidant: NEGATIVE ug/mL
Oxycodone: NEGATIVE ng/mL (ref <100)
Phencyclidine: NEGATIVE ng/mL (ref <25)
pH: 5.7 (ref 4.5–9.0)

## 2020-09-13 LAB — DM TEMPLATE

## 2020-09-16 ENCOUNTER — Ambulatory Visit: Payer: Medicare HMO | Admitting: Family Medicine

## 2020-09-17 ENCOUNTER — Other Ambulatory Visit: Payer: Self-pay

## 2020-09-18 ENCOUNTER — Encounter: Payer: Medicare HMO | Admitting: Family Medicine

## 2020-09-23 ENCOUNTER — Telehealth: Payer: Self-pay | Admitting: Family Medicine

## 2020-09-23 ENCOUNTER — Encounter: Payer: Self-pay | Admitting: Family Medicine

## 2020-09-23 NOTE — Telephone Encounter (Signed)
Patient needs to reschedule the physical she made online.  She scheduled it as a 15 minute office visit.

## 2020-09-27 ENCOUNTER — Ambulatory Visit: Payer: Medicare HMO | Admitting: Family Medicine

## 2020-09-27 ENCOUNTER — Ambulatory Visit (INDEPENDENT_AMBULATORY_CARE_PROVIDER_SITE_OTHER): Payer: Medicare HMO | Admitting: Family Medicine

## 2020-09-27 ENCOUNTER — Encounter: Payer: Self-pay | Admitting: Family Medicine

## 2020-09-27 ENCOUNTER — Other Ambulatory Visit: Payer: Self-pay | Admitting: Family Medicine

## 2020-09-27 ENCOUNTER — Other Ambulatory Visit: Payer: Self-pay

## 2020-09-27 VITALS — BP 120/78 | HR 75 | Temp 98.2°F | Ht 64.0 in | Wt 196.0 lb

## 2020-09-27 DIAGNOSIS — Z0000 Encounter for general adult medical examination without abnormal findings: Secondary | ICD-10-CM | POA: Diagnosis not present

## 2020-09-27 LAB — CBC WITH DIFFERENTIAL/PLATELET
Basophils Absolute: 0 10*3/uL (ref 0.0–0.1)
Basophils Relative: 0.3 % (ref 0.0–3.0)
Eosinophils Absolute: 0.2 10*3/uL (ref 0.0–0.7)
Eosinophils Relative: 1.7 % (ref 0.0–5.0)
HCT: 39.4 % (ref 36.0–46.0)
Hemoglobin: 12.8 g/dL (ref 12.0–15.0)
Lymphocytes Relative: 23.6 % (ref 12.0–46.0)
Lymphs Abs: 2.9 10*3/uL (ref 0.7–4.0)
MCHC: 32.5 g/dL (ref 30.0–36.0)
MCV: 80.8 fl (ref 78.0–100.0)
Monocytes Absolute: 0.8 10*3/uL (ref 0.1–1.0)
Monocytes Relative: 6.6 % (ref 3.0–12.0)
Neutro Abs: 8.2 10*3/uL — ABNORMAL HIGH (ref 1.4–7.7)
Neutrophils Relative %: 67.8 % (ref 43.0–77.0)
Platelets: 316 10*3/uL (ref 150.0–400.0)
RBC: 4.88 Mil/uL (ref 3.87–5.11)
RDW: 15 % (ref 11.5–15.5)
WBC: 12.1 10*3/uL — ABNORMAL HIGH (ref 4.0–10.5)

## 2020-09-27 LAB — HEPATIC FUNCTION PANEL
ALT: 13 U/L (ref 0–35)
AST: 13 U/L (ref 0–37)
Albumin: 3.8 g/dL (ref 3.5–5.2)
Alkaline Phosphatase: 102 U/L (ref 39–117)
Bilirubin, Direct: 0.1 mg/dL (ref 0.0–0.3)
Total Bilirubin: 0.4 mg/dL (ref 0.2–1.2)
Total Protein: 7.5 g/dL (ref 6.0–8.3)

## 2020-09-27 LAB — LIPID PANEL
Cholesterol: 145 mg/dL (ref 0–200)
HDL: 45.2 mg/dL (ref >39.00)
LDL Cholesterol: 84 mg/dL (ref 0–99)
NonHDL: 99.47
Total CHOL/HDL Ratio: 3
Triglycerides: 75 mg/dL (ref 0.0–149.0)
VLDL: 15 mg/dL (ref 0.0–40.0)

## 2020-09-27 LAB — BASIC METABOLIC PANEL
BUN: 15 mg/dL (ref 6–23)
CO2: 26 mEq/L (ref 19–32)
Calcium: 9 mg/dL (ref 8.4–10.5)
Chloride: 102 mEq/L (ref 96–112)
Creatinine, Ser: 0.73 mg/dL (ref 0.40–1.20)
GFR: 84.54 mL/min (ref >60.00)
Glucose, Bld: 103 mg/dL — ABNORMAL HIGH (ref 70–99)
Potassium: 4 mEq/L (ref 3.5–5.1)
Sodium: 137 mEq/L (ref 135–145)

## 2020-09-27 LAB — HEMOGLOBIN A1C: Hgb A1c MFr Bld: 6.6 % — ABNORMAL HIGH (ref 4.6–6.5)

## 2020-09-27 LAB — T4, FREE: Free T4: 1.12 ng/dL (ref 0.60–1.60)

## 2020-09-27 LAB — T3, FREE: T3, Free: 3 pg/mL (ref 2.3–4.2)

## 2020-09-27 LAB — TSH: TSH: 1.14 u[IU]/mL (ref 0.35–4.50)

## 2020-09-27 MED ORDER — SOLIFENACIN SUCCINATE 5 MG PO TABS: 5.0000 mg | ORAL_TABLET | Freq: Every day | ORAL | 3 refills | Status: DC

## 2020-09-27 NOTE — Telephone Encounter (Signed)
See message from pharmacy.

## 2020-09-27 NOTE — Progress Notes (Signed)
   Subjective:    Patient ID: [name], female    DOB: 02/13/1951, 70 y.o.   MRN: [id number]  HPI Here for a well exam. She feels good in general. She notes that she has to wear an adult diaper due to some urine leakage. She tried Oxybutynin with some success, but she stopped it due to constipation. She has a laparoscopic ventral hernia repair coming up next month.   Review of Systems  Constitutional: Negative.   HENT: Negative.   Eyes: Negative.   Respiratory: Negative.   Cardiovascular: Negative.   Gastrointestinal: Negative.   Genitourinary: Negative for decreased urine volume, difficulty urinating, dyspareunia, dysuria, enuresis, flank pain, frequency, hematuria, pelvic pain and urgency.  Musculoskeletal: Negative.   Skin: Negative.   Neurological: Negative.   Psychiatric/Behavioral: Negative.        Objective:   Physical Exam Constitutional:      General: She is not in acute distress.    Appearance: She is well-developed. She is obese.  HENT:     Head: Normocephalic and atraumatic.     Right Ear: External ear normal.     Left Ear: External ear normal.     Nose: Nose normal.     Mouth/Throat:     Pharynx: No oropharyngeal exudate.  Eyes:     General: No scleral icterus.    Conjunctiva/sclera: Conjunctivae normal.     Pupils: Pupils are equal, round, and reactive to light.  Neck:     Thyroid: No thyromegaly.     Vascular: No JVD.  Cardiovascular:     Rate and Rhythm: Normal rate and regular rhythm.     Heart sounds: Normal heart sounds. No murmur heard. No friction rub. No gallop.   Pulmonary:     Effort: Pulmonary effort is normal. No respiratory distress.     Breath sounds: Normal breath sounds. No wheezing or rales.  Chest:     Chest wall: No tenderness.  Abdominal:     General: Bowel sounds are normal. There is no distension.     Palpations: Abdomen is soft. There is no mass.     Tenderness: There is no abdominal tenderness. There is no guarding or  rebound.  Musculoskeletal:        General: No tenderness. Normal range of motion.     Cervical back: Normal range of motion and neck supple.  Lymphadenopathy:     Cervical: No cervical adenopathy.  Skin:    General: Skin is warm and dry.     Findings: No erythema or rash.  Neurological:     Mental Status: She is alert and oriented to person, place, and time.     Cranial Nerves: No cranial nerve deficit.     Motor: No abnormal muscle tone.     Coordination: Coordination normal.     Deep Tendon Reflexes: Reflexes are normal and symmetric. Reflexes normal.  Psychiatric:        Behavior: Behavior normal.        Thought Content: Thought content normal.        Judgment: Judgment normal.           Assessment & Plan:  Well exam. We discussed diet and exercise. Get fasting labs. For the urinary incontinence, she will try Vesicare 5 mg daily.  Alysia Penna, MD

## 2020-09-30 ENCOUNTER — Encounter: Payer: Self-pay | Admitting: Family Medicine

## 2020-10-01 ENCOUNTER — Encounter: Payer: Self-pay | Admitting: Family Medicine

## 2020-10-01 MED ORDER — OXYBUTYNIN CHLORIDE ER 10 MG PO TB24: 10.0000 mg | ORAL_TABLET | Freq: Every day | ORAL | 3 refills | Status: DC

## 2020-10-01 NOTE — Telephone Encounter (Signed)
I cancelled the Vesicare and sent in Oxybutynin instead

## 2020-10-02 NOTE — Telephone Encounter (Signed)
I understand. Cancel the Oxybutynin, and call in Tolterodine LA 4 mg daily , #30 with 5 rf

## 2020-10-03 ENCOUNTER — Other Ambulatory Visit: Payer: Self-pay

## 2020-10-03 MED ORDER — TOLTERODINE TARTRATE ER 4 MG PO CP24: 4.0000 mg | ORAL_CAPSULE | Freq: Every day | ORAL | 5 refills | Status: DC

## 2020-10-03 NOTE — Telephone Encounter (Signed)
This encounter has been resolved 

## 2020-10-03 NOTE — Telephone Encounter (Signed)
Rx for Oxybutynin was d/c per Dr Sarajane Jews, new prescription for Detrol LA 4 mg was sent to pharmacy, pt was notified

## 2020-10-09 ENCOUNTER — Ambulatory Visit: Payer: Self-pay | Admitting: Surgery

## 2020-10-09 NOTE — H&P (View-Only) (Signed)
[name] DOB: 1950-09-04 Married / Language: English / Race: White Female  Patient Care Team: Laurey Morale, MD as PCP - Huston Foley, MD as Consulting Physician (General Surgery)    ` Patient sent for surgical consultation at the request of Dr Rogene Houston  Chief Complaint: abdominal wall lump. Question of incisional hernia. ` ` The patient is an obese woman that had: Restriction requiring urgent [facility] resectionin December 2020.pathology benign consistent with diverticular stricture. An colonoscopy that did not show any tumors or concern. Underwent laparoscopic colostomy takedown last summer 2021. Recovered relatively well. This was done by Dr. with our group who has since retired. Patient felt a nontender abdomen went to the emergency room. She was concerned about possible hernia. ER physician was not convinced there was anything there but offered surgical follow-up to see.   patient comes today with her twin. Patient feels like there is been worsening bulging for the past 3 weeks. She's not had any more diverticulitis since her surgery last year. Moves her bowels once or twice a [place]. She does struggle with chronic neck and sciatic pain. On chronic narcotics. She can walk about 20 minutes if she has a walker card but can get spine and neck pain but slow her down. No exertional chest pain or shortness of breath. She does smoke less than a pack a [place]. No diabetes. No history of heart attack or stroke. Does not recall any wound infections or abscesses. Had underwent colonoscopy last year. Has some mild stress urinary incontinence been no history of UTIs or skin infections.   (Review of systems as stated in this history (HPI) or in the review of systems. Otherwise all other 12 point ROS are negative) ` ` ###########################################`  This patient encounter took 35 minutes today to perform the following: obtain history, perform exam,  review outside records, interpret tests & imaging, counsel the patient on their diagnosis; and, document this encounter, including findings & plan in the electronic health record (EHR).   Problem List/Past Medical Adin Hector, MD; 08/20/2020 11:33 AM) NUMBNESS IN RIGHT LEG (R20.0)  PREOP - [facility] - ENCOUNTER FOR PREOPERATIVE EXAMINATION FOR GENERAL SURGICAL PROCEDURE (Z01.818)  POSTOPERATIVE STATE [phone])  COLON OBSTRUCTION (K56.609)  Sigmoid colectomy, end ostomy, and SB resection on 04/14/2019 for benign stricture - [facility] Colonoscopy by Dr. Hilarie Fredrickson - 09/05/2019 Laparoscopic reversal of her colostomy on 12/07/2019. INCISIONAL HERNIA, WITHOUT OBSTRUCTION OR GANGRENE (K43.2)  HISTORY OF DIVERTICULITIS (Z87.19)  CHRONIC PAIN (G89.29)  TOBACCO ABUSE (Z72.0)  BODY MASS INDEX [BMI] 30.0-30.9, ADULT (Z68.30)   Past Surgical History Adin Hector, MD; 08/20/2020 11:33 AM) Appendectomy  Cataract Surgery  Bilateral. Colon Polyp Removal - Colonoscopy  Colon Removal - Partial  Hysterectomy (not due to cancer) - Partial  Oral Surgery  Spinal Surgery - Neck   Diagnostic Studies History Adin Hector, MD; 08/20/2020 11:33 AM) Colonoscopy  1-5 years ago Mammogram  1-3 years ago Pap Smear  >5 years ago  Allergies Adin Hector, MD; 08/20/2020 11:33 AM) Penicillins  Lisinopril *ANTIHYPERTENSIVES*   Medication History Adin Hector, MD; 08/20/2020 11:33 AM) Medications Reconciled hydroCHLOROthiazide (25MG  Tablet, Oral) Active. HYDROcodone-Acetaminophen (10-325MG  Tablet, Oral) Active. Metoprolol Succinate ER (100MG  Tablet ER 24HR, Oral) Active.  Social History Adin Hector, MD; 08/20/2020 11:33 AM) Alcohol use  Occasional alcohol use. Caffeine use  Coffee. No drug use  Tobacco use  Former smoker.  Family History Adin Hector, MD; 08/20/2020 11:33 AM) Arthritis  Mother. Breast Cancer  Mother, Sister. Hypertension  Mother. Migraine Headache   Daughter. Thyroid problems  Sister.  Pregnancy / Birth History Adin Hector, MD; 08/20/2020 11:33 AM) Age at menarche  89 years. Age of menopause  48-50 Gravida  3 Irregular periods  Length (months) of breastfeeding  7-12 Maternal age  67-20 Para  1  Other Problems Adin Hector, MD; 08/20/2020 11:33 AM) High blood pressure  Oophorectomy  Left.  Vitals (Alisha Spillers CMA; 08/20/2020 10:24 AM) 08/20/2020 10:23 AM Weight: 195.2 lb Height: 64in Body Surface Area: 1.94 m Body Mass Index: 33.51 kg/m  Pulse: 73 (Regular)  BP: 122/70(Sitting, Left Arm, Standard)       Physical Exam Adin Hector MD; 08/20/2020 11:32 AM) General Mental Status-Alert. General Appearance-Not in acute distress, Not Sickly. Orientation-Oriented X3. Hydration-Well hydrated. Voice-Normal.  Integumentary Global Assessment Upon inspection and palpation of skin surfaces of the - Axillae: non-tender, no inflammation or ulceration, no drainage. and Distribution of scalp and body hair is normal. General Characteristics Temperature - normal warmth is noted.  Head and Neck Head-normocephalic, atraumatic with no lesions or palpable masses. Face Global Assessment - atraumatic, no absence of expression. Neck Global Assessment - no abnormal movements, no bruit auscultated on the right, no bruit auscultated on the left, no decreased range of motion, non-tender. Trachea-midline. Thyroid Gland Characteristics - non-tender.  Eye Eyeball - Left-Extraocular movements intact, No Nystagmus - Left. Eyeball - Right-Extraocular movements intact, No Nystagmus - Right. Cornea - Left-No Hazy - Left. Cornea - Right-No Hazy - Right. Sclera/Conjunctiva - Left-No scleral icterus, No Discharge - Left. Sclera/Conjunctiva - Right-No scleral icterus, No Discharge - Right. Pupil - Left-Direct reaction to light normal. Pupil - Right-Direct reaction to light  normal.  ENMT Ears Pinna - Left - no drainage observed, no generalized tenderness observed. Pinna - Right - no drainage observed, no generalized tenderness observed. Nose and Sinuses External Inspection of the Nose - no destructive lesion observed. Inspection of the nares - Left - quiet respiration. Inspection of the nares - Right - quiet respiration. Mouth and Throat Lips - Upper Lip - no fissures observed, no pallor noted. Lower Lip - no fissures observed, no pallor noted. Nasopharynx - no discharge present. Oral Cavity/Oropharynx - Tongue - no dryness observed. Oral Mucosa - no cyanosis observed. Hypopharynx - no evidence of airway distress observed.  Chest and Lung Exam Inspection Movements - Normal and Symmetrical. Accessory muscles - No use of accessory muscles in breathing. Palpation Palpation of the chest reveals - Non-tender. Auscultation Breath sounds - Normal and Clear.  Cardiovascular Auscultation Rhythm - Regular. Murmurs & Other Heart Sounds - Auscultation of the heart reveals - No Murmurs and No Systolic Clicks.  Abdomen Inspection Inspection of the abdomen reveals - No Visible peristalsis and No Abnormal pulsations. Umbilicus - No Bleeding, No Urine drainage. Palpation/Percussion Palpation and Percussion of the abdomen reveal - Soft, Non Tender, No Rebound tenderness, No Rigidity (guarding) and No Cutaneous hyperesthesia. Note: Abdomen soft. Apple body habitus.  bulging at site of left upper quadrant colostomy with 7x7cm subcutaneous mass reducible and gurgling consistent with incisional hernia at old colostomy site. Suspicious for colon/SB within it. Does reduce down easily but sensitive. No midline or umbilical hernia.   Female Genitourinary Sexual Maturity Tanner 5 - Adult hair pattern. Note: No vaginal bleeding nor discharge   Peripheral Vascular Upper Extremity Inspection - Left - No Cyanotic nailbeds - Left, Not Ischemic. Inspection - Right - No  Cyanotic nailbeds - Right, Not Ischemic.  Neurologic Neurologic evaluation reveals -normal attention span and ability to concentrate, able to name objects and repeat phrases. Appropriate fund of knowledge , normal sensation and normal coordination. Mental Status Affect - not angry, not paranoid. Cranial Nerves-Normal Bilaterally. Gait-Normal.  Neuropsychiatric Mental status exam performed with findings of-able to articulate well with normal speech/language, rate, volume and coherence, thought content normal with ability to perform basic computations and apply abstract reasoning and no evidence of hallucinations, delusions, obsessions or homicidal/suicidal ideation.  Musculoskeletal Global Assessment Spine, Ribs and Pelvis - no instability, subluxation or laxity. Right Upper Extremity - no instability, subluxation or laxity.  Lymphatic Head & Neck  General Head & Neck Lymphatics: Bilateral - Description - No Localized lymphadenopathy. Axillary  General Axillary Region: Bilateral - Description - No Localized lymphadenopathy. Femoral & Inguinal  Generalized Femoral & Inguinal Lymphatics: Left - Description - No Localized lymphadenopathy. Right - Description - No Localized lymphadenopathy.    Assessment & Plan Adin Hector MD; 08/20/2020 11:07 AM) Fatima Blank HERNIA, WITHOUT OBSTRUCTION OR GANGRENE (K43.2) Impression: Reducible hernia at left abdomen at site of old colostomy from emergency Hartmann resection for diverticular colon obstruction and colostomy takedown in 2021.. Some gurgling raises suspicion that it is colon or small bowel within it.  Standard of care is hernia repair. Labs I's fusions with underlay repair with mesh. Given her obesity, would likely use a larger sheet.  Her performance status is okay. I think because she survived major emergency surgery and colon surgery last year; I do not think any more aggressive cardiac clearance is needed.  The biggest  sticking point is that she smokes. I said in no uncertain terms she has to quit smoking before I will repair her hernia since the risk of infection and hernia recurrence in smokers is unacceptably high. She agrees. Her twin is motivated to help her sister quit as well. PREOP - [place] - ENCOUNTER FOR PREOPERATIVE EXAMINATION FOR GENERAL SURGICAL PROCEDURE (Z01.818) Current Plans You are being scheduled for surgery- Our schedulers will call you.  You should hear from our office's scheduling department within 5 working days about the location, date, and time of surgery. We try to make accommodations for patient's preferences in scheduling surgery, but sometimes the OR schedule or the surgeon's schedule prevents [place] from making those accommodations.  If you have not heard from our office [phone]) in 5 working days, call the office and ask for your surgeon's nurse.  If you have other questions about your diagnosis, plan, or surgery, call the office and ask for your surgeon's nurse.  Written instructions provided [facility] Consent - Hernia Repair - Ventral/Incisional/Umbilical (Armonte Tortorella): discussed with patient and provided information. Pt Education - [facility] Hernia Post-Op HCI (Morine Kohlman): discussed with patient and provided information. Pt Education - Pamphlet Given - Laparoscopic Hernia Repair: discussed with patient and provided information. Pt Education - [facility] Mesh education: discussed with patient and provided information. TOBACCO ABUSE (Z72.0) Impression: STOP SMOKING! I strongly recommend she quit smoking I will not do her hernia repair since the risks of infection and recurrence are acceptable be high. She is motivated to quit. I think if she is off tobacco for 3 weeks, risks of surgery should be minimized. Patient's sister is motivated to help her twin quit as well  We talked to the patient about the dangers of smoking. We stressed that tobacco use dramatically increases the risk of peri-operative  complications such as infection, tissue necrosis leaving to problems with incision/wound and organ healing, hernia, chronic pain,  heart attack, stroke, DVT, pulmonary embolism, and death. We noted there are programs in our community to help stop smoking. Information was available. Current Plans Pt Education - [facility] STOP SMOKING! BODY MASS INDEX [BMI] 30.0-30.9, ADULT (Z68.30) Impression: her obesity increases hernia recurrence rate. Would plan larger she diminished case. Hopefully can be outpatient surgery but we will see. HISTORY OF DIVERTICULITIS (Z87.19) Impression: history of colon obstruction requiring emergent Hartmann resection for diverticular stricture. Doing well otherwise. Colonoscopy without any other concerns.  High-fiber diet to minimize future diverticulosis issues CHRONIC PAIN (G89.29) Impression: patient with chronic pain due to chronic neck issues and more recently sciatica involving her left side. She tells me left today although she had had complaints along her right lateral thigh and Dr. Sarajane Jews had noted concerns of sciatica on her right side.  Nonetheless, she will need increased pain medicine preoperatively as ventral hernia repair with hurt. Neck sure that her primary care physician, Dr. Sarajane Jews, as aware since he helps administer her chronic pain regimen.  Adin Hector, MD, FACS, MASCRS  Esophageal, Gastrointestinal & Colorectal Surgery Robotic and Minimally Invasive Surgery [facility] [address] [place], [place] [address] [phone] Fax [phone] Main/Paging  CONTACT INFORMATION: Weekday (9AM-5PM) concerns: Call [facility] main office at [phone] Weeknight (5PM-9AM) or Weekend/Holiday concerns: Check [email or web address] for General Surgery [facility] coverage (Please, do not use SecureChat as it is not reliable communication to operating surgeons for immediate patient care)

## 2020-10-09 NOTE — H&P (Signed)
[name] DOB: 08-18-1950 Married / Language: English / Race: White Female  Patient Care Team: Laurey Morale, MD as PCP - Huston Foley, MD as Consulting Physician (General Surgery)    ` Patient sent for surgical consultation at the request of Dr Rogene Houston  Chief Complaint: abdominal wall lump. Question of incisional hernia. ` ` The patient is an obese woman that had: Restriction requiring urgent [facility] resectionin December 2020.pathology benign consistent with diverticular stricture. An colonoscopy that did not show any tumors or concern. Underwent laparoscopic colostomy takedown last summer 2021. Recovered relatively well. This was done by Dr. with our group who has since retired. Patient felt a nontender abdomen went to the emergency room. She was concerned about possible hernia. ER physician was not convinced there was anything there but offered surgical follow-up to see.   patient comes today with her twin. Patient feels like there is been worsening bulging for the past 3 weeks. She's not had any more diverticulitis since her surgery last year. Moves her bowels once or twice a [place]. She does struggle with chronic neck and sciatic pain. On chronic narcotics. She can walk about 20 minutes if she has a walker card but can get spine and neck pain but slow her down. No exertional chest pain or shortness of breath. She does smoke less than a pack a [place]. No diabetes. No history of heart attack or stroke. Does not recall any wound infections or abscesses. Had underwent colonoscopy last year. Has some mild stress urinary incontinence been no history of UTIs or skin infections.   (Review of systems as stated in this history (HPI) or in the review of systems. Otherwise all other 12 point ROS are negative) ` ` ###########################################`  This patient encounter took 35 minutes today to perform the following: obtain history, perform exam,  review outside records, interpret tests & imaging, counsel the patient on their diagnosis; and, document this encounter, including findings & plan in the electronic health record (EHR).   Problem List/Past Medical Adin Hector, MD; 08/20/2020 11:33 AM) NUMBNESS IN RIGHT LEG (R20.0)  PREOP - [facility] - ENCOUNTER FOR PREOPERATIVE EXAMINATION FOR GENERAL SURGICAL PROCEDURE (Z01.818)  POSTOPERATIVE STATE [phone])  COLON OBSTRUCTION (K56.609)  Sigmoid colectomy, end ostomy, and SB resection on 04/14/2019 for benign stricture - [facility] Colonoscopy by Dr. Hilarie Fredrickson - 09/05/2019 Laparoscopic reversal of her colostomy on 12/07/2019. INCISIONAL HERNIA, WITHOUT OBSTRUCTION OR GANGRENE (K43.2)  HISTORY OF DIVERTICULITIS (Z87.19)  CHRONIC PAIN (G89.29)  TOBACCO ABUSE (Z72.0)  BODY MASS INDEX [BMI] 30.0-30.9, ADULT (Z68.30)   Past Surgical History Adin Hector, MD; 08/20/2020 11:33 AM) Appendectomy  Cataract Surgery  Bilateral. Colon Polyp Removal - Colonoscopy  Colon Removal - Partial  Hysterectomy (not due to cancer) - Partial  Oral Surgery  Spinal Surgery - Neck   Diagnostic Studies History Adin Hector, MD; 08/20/2020 11:33 AM) Colonoscopy  1-5 years ago Mammogram  1-3 years ago Pap Smear  >5 years ago  Allergies Adin Hector, MD; 08/20/2020 11:33 AM) Penicillins  Lisinopril *ANTIHYPERTENSIVES*   Medication History Adin Hector, MD; 08/20/2020 11:33 AM) Medications Reconciled hydroCHLOROthiazide (25MG  Tablet, Oral) Active. HYDROcodone-Acetaminophen (10-325MG  Tablet, Oral) Active. Metoprolol Succinate ER (100MG  Tablet ER 24HR, Oral) Active.  Social History Adin Hector, MD; 08/20/2020 11:33 AM) Alcohol use  Occasional alcohol use. Caffeine use  Coffee. No drug use  Tobacco use  Former smoker.  Family History Adin Hector, MD; 08/20/2020 11:33 AM) Arthritis  Mother. Breast Cancer  Mother, Sister. Hypertension  Mother. Migraine Headache   Daughter. Thyroid problems  Sister.  Pregnancy / Birth History Adin Hector, MD; 08/20/2020 11:33 AM) Age at menarche  67 years. Age of menopause  44-50 Gravida  3 Irregular periods  Length (months) of breastfeeding  7-12 Maternal age  15-20 Para  1  Other Problems Adin Hector, MD; 08/20/2020 11:33 AM) High blood pressure  Oophorectomy  Left.  Vitals (Alisha Spillers CMA; 08/20/2020 10:24 AM) 08/20/2020 10:23 AM Weight: 195.2 lb Height: 64in Body Surface Area: 1.94 m Body Mass Index: 33.51 kg/m  Pulse: 73 (Regular)  BP: 122/70(Sitting, Left Arm, Standard)       Physical Exam Adin Hector MD; 08/20/2020 11:32 AM) General Mental Status-Alert. General Appearance-Not in acute distress, Not Sickly. Orientation-Oriented X3. Hydration-Well hydrated. Voice-Normal.  Integumentary Global Assessment Upon inspection and palpation of skin surfaces of the - Axillae: non-tender, no inflammation or ulceration, no drainage. and Distribution of scalp and body hair is normal. General Characteristics Temperature - normal warmth is noted.  Head and Neck Head-normocephalic, atraumatic with no lesions or palpable masses. Face Global Assessment - atraumatic, no absence of expression. Neck Global Assessment - no abnormal movements, no bruit auscultated on the right, no bruit auscultated on the left, no decreased range of motion, non-tender. Trachea-midline. Thyroid Gland Characteristics - non-tender.  Eye Eyeball - Left-Extraocular movements intact, No Nystagmus - Left. Eyeball - Right-Extraocular movements intact, No Nystagmus - Right. Cornea - Left-No Hazy - Left. Cornea - Right-No Hazy - Right. Sclera/Conjunctiva - Left-No scleral icterus, No Discharge - Left. Sclera/Conjunctiva - Right-No scleral icterus, No Discharge - Right. Pupil - Left-Direct reaction to light normal. Pupil - Right-Direct reaction to light  normal.  ENMT Ears Pinna - Left - no drainage observed, no generalized tenderness observed. Pinna - Right - no drainage observed, no generalized tenderness observed. Nose and Sinuses External Inspection of the Nose - no destructive lesion observed. Inspection of the nares - Left - quiet respiration. Inspection of the nares - Right - quiet respiration. Mouth and Throat Lips - Upper Lip - no fissures observed, no pallor noted. Lower Lip - no fissures observed, no pallor noted. Nasopharynx - no discharge present. Oral Cavity/Oropharynx - Tongue - no dryness observed. Oral Mucosa - no cyanosis observed. Hypopharynx - no evidence of airway distress observed.  Chest and Lung Exam Inspection Movements - Normal and Symmetrical. Accessory muscles - No use of accessory muscles in breathing. Palpation Palpation of the chest reveals - Non-tender. Auscultation Breath sounds - Normal and Clear.  Cardiovascular Auscultation Rhythm - Regular. Murmurs & Other Heart Sounds - Auscultation of the heart reveals - No Murmurs and No Systolic Clicks.  Abdomen Inspection Inspection of the abdomen reveals - No Visible peristalsis and No Abnormal pulsations. Umbilicus - No Bleeding, No Urine drainage. Palpation/Percussion Palpation and Percussion of the abdomen reveal - Soft, Non Tender, No Rebound tenderness, No Rigidity (guarding) and No Cutaneous hyperesthesia. Note: Abdomen soft. Apple body habitus.  bulging at site of left upper quadrant colostomy with 7x7cm subcutaneous mass reducible and gurgling consistent with incisional hernia at old colostomy site. Suspicious for colon/SB within it. Does reduce down easily but sensitive. No midline or umbilical hernia.   Female Genitourinary Sexual Maturity Tanner 5 - Adult hair pattern. Note: No vaginal bleeding nor discharge   Peripheral Vascular Upper Extremity Inspection - Left - No Cyanotic nailbeds - Left, Not Ischemic. Inspection - Right - No  Cyanotic nailbeds - Right, Not Ischemic.  Neurologic Neurologic evaluation reveals -normal attention span and ability to concentrate, able to name objects and repeat phrases. Appropriate fund of knowledge , normal sensation and normal coordination. Mental Status Affect - not angry, not paranoid. Cranial Nerves-Normal Bilaterally. Gait-Normal.  Neuropsychiatric Mental status exam performed with findings of-able to articulate well with normal speech/language, rate, volume and coherence, thought content normal with ability to perform basic computations and apply abstract reasoning and no evidence of hallucinations, delusions, obsessions or homicidal/suicidal ideation.  Musculoskeletal Global Assessment Spine, Ribs and Pelvis - no instability, subluxation or laxity. Right Upper Extremity - no instability, subluxation or laxity.  Lymphatic Head & Neck  General Head & Neck Lymphatics: Bilateral - Description - No Localized lymphadenopathy. Axillary  General Axillary Region: Bilateral - Description - No Localized lymphadenopathy. Femoral & Inguinal  Generalized Femoral & Inguinal Lymphatics: Left - Description - No Localized lymphadenopathy. Right - Description - No Localized lymphadenopathy.    Assessment & Plan Adin Hector MD; 08/20/2020 11:07 AM) Fatima Blank HERNIA, WITHOUT OBSTRUCTION OR GANGRENE (K43.2) Impression: Reducible hernia at left abdomen at site of old colostomy from emergency Hartmann resection for diverticular colon obstruction and colostomy takedown in 2021.. Some gurgling raises suspicion that it is colon or small bowel within it.  Standard of care is hernia repair. Labs I's fusions with underlay repair with mesh. Given her obesity, would likely use a larger sheet.  Her performance status is okay. I think because she survived major emergency surgery and colon surgery last year; I do not think any more aggressive cardiac clearance is needed.  The biggest  sticking point is that she smokes. I said in no uncertain terms she has to quit smoking before I will repair her hernia since the risk of infection and hernia recurrence in smokers is unacceptably high. She agrees. Her twin is motivated to help her sister quit as well. PREOP - [place] - ENCOUNTER FOR PREOPERATIVE EXAMINATION FOR GENERAL SURGICAL PROCEDURE (Z01.818) Current Plans You are being scheduled for surgery- Our schedulers will call you.  You should hear from our office's scheduling department within 5 working days about the location, date, and time of surgery. We try to make accommodations for patient's preferences in scheduling surgery, but sometimes the OR schedule or the surgeon's schedule prevents [place] from making those accommodations.  If you have not heard from our office [phone]) in 5 working days, call the office and ask for your surgeon's nurse.  If you have other questions about your diagnosis, plan, or surgery, call the office and ask for your surgeon's nurse.  Written instructions provided [facility] Consent - Hernia Repair - Ventral/Incisional/Umbilical (Shabreka Coulon): discussed with patient and provided information. Pt Education - [facility] Hernia Post-Op HCI (Justine Cossin): discussed with patient and provided information. Pt Education - Pamphlet Given - Laparoscopic Hernia Repair: discussed with patient and provided information. Pt Education - [facility] Mesh education: discussed with patient and provided information. TOBACCO ABUSE (Z72.0) Impression: STOP SMOKING! I strongly recommend she quit smoking I will not do her hernia repair since the risks of infection and recurrence are acceptable be high. She is motivated to quit. I think if she is off tobacco for 3 weeks, risks of surgery should be minimized. Patient's sister is motivated to help her twin quit as well  We talked to the patient about the dangers of smoking. We stressed that tobacco use dramatically increases the risk of peri-operative  complications such as infection, tissue necrosis leaving to problems with incision/wound and organ healing, hernia, chronic pain,  heart attack, stroke, DVT, pulmonary embolism, and death. We noted there are programs in our community to help stop smoking. Information was available. Current Plans Pt Education - [facility] STOP SMOKING! BODY MASS INDEX [BMI] 30.0-30.9, ADULT (Z68.30) Impression: her obesity increases hernia recurrence rate. Would plan larger she diminished case. Hopefully can be outpatient surgery but we will see. HISTORY OF DIVERTICULITIS (Z87.19) Impression: history of colon obstruction requiring emergent Hartmann resection for diverticular stricture. Doing well otherwise. Colonoscopy without any other concerns.  High-fiber diet to minimize future diverticulosis issues CHRONIC PAIN (G89.29) Impression: patient with chronic pain due to chronic neck issues and more recently sciatica involving her left side. She tells me left today although she had had complaints along her right lateral thigh and Dr. Sarajane Jews had noted concerns of sciatica on her right side.  Nonetheless, she will need increased pain medicine preoperatively as ventral hernia repair with hurt. Neck sure that her primary care physician, Dr. Sarajane Jews, as aware since he helps administer her chronic pain regimen.  Adin Hector, MD, FACS, MASCRS  Esophageal, Gastrointestinal & Colorectal Surgery Robotic and Minimally Invasive Surgery [facility] [address], [place],  [place] [address] [phone] Fax [phone] Main/Paging  CONTACT INFORMATION: Weekday (9AM-5PM) concerns: Call [facility] main office at [phone] Weeknight (5PM-9AM) or Weekend/Holiday concerns: Check [email or web address] for General Surgery [facility] coverage (Please, do not use SecureChat as it is not reliable communication to operating surgeons for immediate patient care)

## 2020-10-18 NOTE — Pre-Procedure Instructions (Signed)
Surgical Instructions    Your procedure is scheduled on Friday, June 17th.  Report to [facility] Main Entrance "A" at 11:30 A.M., then check in with the Admitting office.  Call this number if you have problems the morning of surgery:  [phone]   If you have any questions prior to your surgery date call [phone]: Open Monday-Friday 8am-4pm    Remember:  Do not eat after midnight the night before your surgery  You may drink clear liquids until 10:30 a.m. the morning of your surgery.   Clear liquids allowed are: Water, Non-Citrus Juices (without pulp), Carbonated Beverages, Clear Tea, Black Coffee Only, and Gatorade.  Patient Instructions  The [place] of surgery (if you do NOT have diabetes):  Drink ONE (2) Pre-Surgery Clear Ensure by bedtime on Thursday night. Drink (1) Pre-Surgery Clear Ensure by  10:30 am the morning of surgery   This drinks were given to you during your hospital  pre-op appointment visit. Nothing else to drink after completing the  Pre-Surgery Clear Ensure.         If you have questions, please contact your surgeon's office.     Take these medicines the morning of surgery with A SIP OF WATER  metoprolol succinate (TOPROL-XL)  tolterodine (DETROL LA)    Take these as needed: cyclobenzaprine (FLEXERIL) HYDROcodone-acetaminophen (NORCO)  As of today, STOP taking any Aspirin (unless otherwise instructed by your surgeon) Aleve, Naproxen, Ibuprofen, Motrin, Advil, Goody's, BC's, all herbal medications, fish oil, and all vitamins.                     Do NOT Smoke (Tobacco/Vaping) or drink Alcohol 24 hours prior to your procedure.  If you use a CPAP at night, you may bring all equipment for your overnight stay.   Contacts, glasses, piercing's, hearing aid's, dentures or partials may not be worn into surgery, please bring cases for these belongings.    For patients admitted to the hospital, discharge time will be determined by your treatment team.    Patients discharged the [place] of surgery will not be allowed to drive home, and someone needs to stay with them for 24 hours.    Special instructions:   [place]- Preparing For Surgery  Before surgery, you can play an important role. Because skin is not sterile, your skin needs to be as free of germs as possible. You can reduce the number of germs on your skin by washing with CHG (chlorahexidine gluconate) Soap before surgery.  CHG is an antiseptic cleaner which kills germs and bonds with the skin to continue killing germs even after washing.    Oral Hygiene is also important to reduce your risk of infection.  Remember - BRUSH YOUR TEETH THE MORNING OF SURGERY WITH YOUR REGULAR TOOTHPASTE  Please do not use if you have an allergy to CHG or antibacterial soaps. If your skin becomes reddened/irritated stop using the CHG.  Do not shave (including legs and underarms) for at least 48 hours prior to first CHG shower. It is OK to shave your face.  Please follow these instructions carefully.   Shower the NIGHT BEFORE SURGERY and the MORNING OF SURGERY  If you chose to wash your hair, wash your hair first as usual with your normal shampoo.  After you shampoo, rinse your hair and body thoroughly to remove the shampoo.  Use CHG Soap as you would any other liquid soap. You can apply CHG directly to the skin and wash gently with  a scrungie or a clean washcloth.   Apply the CHG Soap to your body ONLY FROM THE NECK DOWN.  Do not use on open wounds or open sores. Avoid contact with your eyes, ears, mouth and genitals (private parts). Wash Face and genitals (private parts)  with your normal soap.   Wash thoroughly, paying special attention to the area where your surgery will be performed.  Thoroughly rinse your body with warm water from the neck down.  DO NOT shower/wash with your normal soap after using and rinsing off the CHG Soap.  Pat yourself dry with a CLEAN TOWEL.  Wear CLEAN PAJAMAS to  bed the night before surgery  Place CLEAN SHEETS on your bed the night before your surgery  DO NOT SLEEP WITH PETS.   [place] of Surgery: Shower with CHG soap. Do not wear jewelry, make up, or nail polish Do not wear lotions, powders, perfumes, or deodorant. Do not shave 48 hours prior to surgery.  Do not bring valuables to the hospital. [facility] is not responsible for any belongings or valuables. Wear Clean/Comfortable clothing the morning of surgery Remember to brush your teeth WITH YOUR REGULAR TOOTHPASTE.   Please read over the following fact sheets that you were given.

## 2020-10-21 ENCOUNTER — Encounter (HOSPITAL_COMMUNITY)
Admission: RE | Admit: 2020-10-21 | Discharge: 2020-10-21 | Disposition: A | Discharge status: Home / Self Care | Payer: Medicare HMO | Source: Ambulatory Visit | Attending: Surgery | Admitting: Surgery

## 2020-10-21 ENCOUNTER — Other Ambulatory Visit: Payer: Self-pay

## 2020-10-21 ENCOUNTER — Encounter (HOSPITAL_COMMUNITY): Payer: Self-pay | Admitting: Surgery

## 2020-10-21 DIAGNOSIS — Z01818 Encounter for other preprocedural examination: Secondary | ICD-10-CM | POA: Diagnosis not present

## 2020-10-21 DIAGNOSIS — Z20822 Contact with and (suspected) exposure to covid-19: Secondary | ICD-10-CM | POA: Insufficient documentation

## 2020-10-21 LAB — SARS CORONAVIRUS 2 (TAT 6-24 HRS): SARS Coronavirus 2: NEGATIVE

## 2020-10-21 NOTE — Progress Notes (Signed)
PCP - Alysia Penna  Cardiologist -   PPM/ICD -denies Device Orders -  Rep Notified -   Chest x-ray - none EKG - 10/21/20 Stress Test - no ECHO - no Cardiac Cath - no  Sleep Study - no CPAP - no  Fasting Blood Sugar - n/a Checks Blood Sugar _____ times a [place]  Blood Thinner Instructions:n/a Aspirin Instructions:n/a  ERAS Protcol - yes PRE-SURGERY Ensure or G2- Ensure- two the night before;one the morning of by 1030  COVID TEST- 10/21/20   Anesthesia review: no  Patient denies shortness of breath, fever, cough and chest pain at PAT appointment   All instructions explained to the patient, with a verbal understanding of the material. Patient agrees to go over the instructions while at home for a better understanding. Patient also instructed to self quarantine after being tested for COVID-19. The opportunity to ask questions was provided.

## 2020-10-22 ENCOUNTER — Other Ambulatory Visit (HOSPITAL_COMMUNITY): Payer: Medicare HMO

## 2020-10-24 ENCOUNTER — Telehealth: Payer: Self-pay | Admitting: Family Medicine

## 2020-10-24 ENCOUNTER — Encounter: Payer: Self-pay | Admitting: Family Medicine

## 2020-10-24 ENCOUNTER — Telehealth: Payer: Self-pay

## 2020-10-24 MED ORDER — HYDROCODONE-ACETAMINOPHEN 10-325 MG PO TABS: 1.0000 | ORAL_TABLET | Freq: Three times a day (TID) | ORAL | 0 refills | Status: AC | PRN

## 2020-10-24 NOTE — Telephone Encounter (Signed)
Patient is calling and wanted to see if provider could send a new prescription for  HYDROcodone-acetaminophen (NORCO) 10-325 MG tablet to [facility] [address] because her [facility] pharmacy it out. CB is [phone]

## 2020-10-24 NOTE — Telephone Encounter (Signed)
This was done (see the other message)

## 2020-10-24 NOTE — Telephone Encounter (Signed)
Pt is aware that Rx was sent to the pharmacy as rquested

## 2020-10-24 NOTE — Telephone Encounter (Signed)
Spoke with pt state that her pharmacy does not have any Hydrocodone in stock, advised pt that PCP needs to send a new Rx to [facility] on [address] since they have Rx in stock. Pt request to be notified once this is sent,I  will call pt pharmacy to cancel previous Rx for Hydrocodone.

## 2020-10-24 NOTE — Telephone Encounter (Signed)
Spoke with pt advised that new Rx for Hydrocodone was sent to [facility] [address] (one month supply). Spoke with kyana  from pt previous pharmacy advised to cancel Hydrocodone Rx since they do not have any in stock per Dr Sarajane Jews

## 2020-10-24 NOTE — Telephone Encounter (Signed)
I sent in a [facility] supply [facility] the [facility]

## 2020-10-25 ENCOUNTER — Encounter (HOSPITAL_COMMUNITY)
Admission: AD | Disposition: A | Discharge status: Home / Self Care | Payer: Self-pay | Source: Home / Self Care | Attending: Surgery

## 2020-10-25 ENCOUNTER — Observation Stay (HOSPITAL_COMMUNITY)
Admission: AD | Admit: 2020-10-25 | Discharge: 2020-10-26 | Disposition: A | Discharge status: Home / Self Care | Payer: Medicare HMO | Attending: Surgery | Admitting: Surgery

## 2020-10-25 ENCOUNTER — Other Ambulatory Visit: Payer: Self-pay

## 2020-10-25 ENCOUNTER — Encounter (HOSPITAL_COMMUNITY): Payer: Self-pay | Admitting: Surgery

## 2020-10-25 ENCOUNTER — Ambulatory Visit (HOSPITAL_COMMUNITY): Payer: Medicare HMO | Admitting: Anesthesiology

## 2020-10-25 DIAGNOSIS — F17210 Nicotine dependence, cigarettes, uncomplicated: Secondary | ICD-10-CM | POA: Diagnosis present

## 2020-10-25 DIAGNOSIS — G8929 Other chronic pain: Secondary | ICD-10-CM | POA: Diagnosis not present

## 2020-10-25 DIAGNOSIS — K430 Incisional hernia with obstruction, without gangrene: Secondary | ICD-10-CM | POA: Diagnosis not present

## 2020-10-25 DIAGNOSIS — Z79899 Other long term (current) drug therapy: Secondary | ICD-10-CM | POA: Diagnosis not present

## 2020-10-25 DIAGNOSIS — Z6839 Body mass index (BMI) 39.0-39.9, adult: Secondary | ICD-10-CM | POA: Diagnosis not present

## 2020-10-25 DIAGNOSIS — Z8249 Family history of ischemic heart disease and other diseases of the circulatory system: Secondary | ICD-10-CM

## 2020-10-25 DIAGNOSIS — M5430 Sciatica, unspecified side: Secondary | ICD-10-CM | POA: Diagnosis present

## 2020-10-25 DIAGNOSIS — Z8261 Family history of arthritis: Secondary | ICD-10-CM | POA: Diagnosis not present

## 2020-10-25 DIAGNOSIS — Z880 Allergy status to penicillin: Secondary | ICD-10-CM

## 2020-10-25 DIAGNOSIS — K580 Irritable bowel syndrome with diarrhea: Secondary | ICD-10-CM | POA: Insufficient documentation

## 2020-10-25 DIAGNOSIS — I10 Essential (primary) hypertension: Secondary | ICD-10-CM | POA: Diagnosis not present

## 2020-10-25 DIAGNOSIS — Z803 Family history of malignant neoplasm of breast: Secondary | ICD-10-CM

## 2020-10-25 DIAGNOSIS — Z86010 Personal history of colonic polyps: Secondary | ICD-10-CM

## 2020-10-25 DIAGNOSIS — Z888 Allergy status to other drugs, medicaments and biological substances status: Secondary | ICD-10-CM | POA: Diagnosis not present

## 2020-10-25 DIAGNOSIS — M542 Cervicalgia: Secondary | ICD-10-CM | POA: Diagnosis not present

## 2020-10-25 DIAGNOSIS — D485 Neoplasm of uncertain behavior of skin: Secondary | ICD-10-CM | POA: Insufficient documentation

## 2020-10-25 DIAGNOSIS — Z91018 Allergy to other foods: Secondary | ICD-10-CM

## 2020-10-25 DIAGNOSIS — Z981 Arthrodesis status: Secondary | ICD-10-CM

## 2020-10-25 DIAGNOSIS — E669 Obesity, unspecified: Secondary | ICD-10-CM | POA: Diagnosis not present

## 2020-10-25 DIAGNOSIS — Z9841 Cataract extraction status, right eye: Secondary | ICD-10-CM | POA: Diagnosis not present

## 2020-10-25 DIAGNOSIS — K581 Irritable bowel syndrome with constipation: Secondary | ICD-10-CM | POA: Insufficient documentation

## 2020-10-25 DIAGNOSIS — Z8719 Personal history of other diseases of the digestive system: Secondary | ICD-10-CM

## 2020-10-25 DIAGNOSIS — Z87891 Personal history of nicotine dependence: Secondary | ICD-10-CM | POA: Diagnosis not present

## 2020-10-25 DIAGNOSIS — Z90711 Acquired absence of uterus with remaining cervical stump: Secondary | ICD-10-CM | POA: Diagnosis not present

## 2020-10-25 DIAGNOSIS — K469 Unspecified abdominal hernia without obstruction or gangrene: Secondary | ICD-10-CM | POA: Diagnosis not present

## 2020-10-25 DIAGNOSIS — K432 Incisional hernia without obstruction or gangrene: Secondary | ICD-10-CM | POA: Diagnosis not present

## 2020-10-25 DIAGNOSIS — Z91012 Allergy to eggs: Secondary | ICD-10-CM

## 2020-10-25 DIAGNOSIS — Z9842 Cataract extraction status, left eye: Secondary | ICD-10-CM | POA: Diagnosis not present

## 2020-10-25 DIAGNOSIS — N393 Stress incontinence (female) (male): Secondary | ICD-10-CM | POA: Diagnosis present

## 2020-10-25 DIAGNOSIS — K582 Mixed irritable bowel syndrome: Secondary | ICD-10-CM | POA: Diagnosis present

## 2020-10-25 DIAGNOSIS — F1120 Opioid dependence, uncomplicated: Secondary | ICD-10-CM | POA: Diagnosis present

## 2020-10-25 DIAGNOSIS — K436 Other and unspecified ventral hernia with obstruction, without gangrene: Secondary | ICD-10-CM | POA: Diagnosis present

## 2020-10-25 DIAGNOSIS — Z6833 Body mass index (BMI) 33.0-33.9, adult: Secondary | ICD-10-CM | POA: Diagnosis not present

## 2020-10-25 DIAGNOSIS — M5432 Sciatica, left side: Secondary | ICD-10-CM | POA: Diagnosis present

## 2020-10-25 DIAGNOSIS — L219 Seborrheic dermatitis, unspecified: Secondary | ICD-10-CM | POA: Insufficient documentation

## 2020-10-25 DIAGNOSIS — K439 Ventral hernia without obstruction or gangrene: Secondary | ICD-10-CM | POA: Diagnosis present

## 2020-10-25 DIAGNOSIS — M792 Neuralgia and neuritis, unspecified: Secondary | ICD-10-CM | POA: Diagnosis not present

## 2020-10-25 DIAGNOSIS — R69 Illness, unspecified: Secondary | ICD-10-CM | POA: Diagnosis not present

## 2020-10-25 HISTORY — PX: LYSIS OF ADHESION: SHX5961

## 2020-10-25 HISTORY — PX: VENTRAL HERNIA REPAIR: SHX424

## 2020-10-25 HISTORY — PX: INSERTION OF MESH: SHX5868

## 2020-10-25 SURGERY — REPAIR, HERNIA, VENTRAL, LAPAROSCOPIC
Anesthesia: General | Site: Abdomen

## 2020-10-25 MED ORDER — CHLORHEXIDINE GLUCONATE CLOTH 2 % EX PADS: 6.0000 | MEDICATED_PAD | Freq: Once | CUTANEOUS | Status: DC

## 2020-10-25 MED ORDER — MIDAZOLAM HCL 2 MG/2ML IJ SOLN
INTRAMUSCULAR | Status: AC
  Filled 2020-10-25: qty 2

## 2020-10-25 MED ORDER — ACETAMINOPHEN 500 MG PO TABS: 1000.0000 mg | ORAL_TABLET | ORAL | Status: AC

## 2020-10-25 MED ORDER — LACTATED RINGERS IV BOLUS: 1000.0000 mL | Freq: Three times a day (TID) | INTRAVENOUS | Status: DC | PRN

## 2020-10-25 MED ORDER — CLINDAMYCIN PHOSPHATE 900 MG/50ML IV SOLN
INTRAVENOUS | Status: AC
  Filled 2020-10-25: qty 50

## 2020-10-25 MED ORDER — HYDROMORPHONE HCL 1 MG/ML IJ SOLN
0.2500 mg | INTRAMUSCULAR | Status: DC | PRN
  Administered 2020-10-25 (×2): 0.5 mg via INTRAVENOUS

## 2020-10-25 MED ORDER — ENOXAPARIN SODIUM 40 MG/0.4ML IJ SOSY
40.0000 mg | PREFILLED_SYRINGE | INTRAMUSCULAR | Status: DC
  Filled 2020-10-25: qty 0.4

## 2020-10-25 MED ORDER — ENSURE PRE-SURGERY PO LIQD: 592.0000 mL | Freq: Once | ORAL | Status: DC

## 2020-10-25 MED ORDER — MIDAZOLAM HCL 2 MG/2ML IJ SOLN
INTRAMUSCULAR | Status: DC | PRN
  Administered 2020-10-25: 2 mg via INTRAVENOUS

## 2020-10-25 MED ORDER — METOPROLOL TARTRATE 12.5 MG HALF TABLET: 12.5000 mg | ORAL_TABLET | Freq: Two times a day (BID) | ORAL | Status: DC | PRN

## 2020-10-25 MED ORDER — OXYCODONE HCL 5 MG PO TABS: 5.0000 mg | ORAL_TABLET | Freq: Once | ORAL | Status: DC | PRN

## 2020-10-25 MED ORDER — LIDOCAINE 2% (20 MG/ML) 5 ML SYRINGE
INTRAMUSCULAR | Status: DC | PRN
  Administered 2020-10-25: 80 mg via INTRAVENOUS

## 2020-10-25 MED ORDER — BISACODYL 10 MG RE SUPP: 10.0000 mg | Freq: Every day | RECTAL | Status: DC | PRN

## 2020-10-25 MED ORDER — LIP MEDEX EX OINT
1.0000 "application " | TOPICAL_OINTMENT | Freq: Two times a day (BID) | CUTANEOUS | Status: DC
  Administered 2020-10-25 – 2020-10-26 (×2): 1 via TOPICAL
  Filled 2020-10-25: qty 7

## 2020-10-25 MED ORDER — CHLORHEXIDINE GLUCONATE 0.12 % MT SOLN
OROMUCOSAL | Status: AC
  Administered 2020-10-25: 15 mL via OROMUCOSAL
  Filled 2020-10-25: qty 15

## 2020-10-25 MED ORDER — GABAPENTIN 300 MG PO CAPS: 300.0000 mg | ORAL_CAPSULE | Freq: Two times a day (BID) | ORAL | 1 refills | Status: DC

## 2020-10-25 MED ORDER — SODIUM CHLORIDE 0.9% FLUSH
3.0000 mL | Freq: Two times a day (BID) | INTRAVENOUS | Status: DC
  Administered 2020-10-25: 3 mL via INTRAVENOUS

## 2020-10-25 MED ORDER — MEPERIDINE HCL 25 MG/ML IJ SOLN: 6.2500 mg | INTRAMUSCULAR | Status: DC | PRN

## 2020-10-25 MED ORDER — MAGNESIUM HYDROXIDE 400 MG/5ML PO SUSP: 30.0000 mL | Freq: Every day | ORAL | Status: DC | PRN

## 2020-10-25 MED ORDER — SODIUM CHLORIDE 0.9% FLUSH: 3.0000 mL | INTRAVENOUS | Status: DC | PRN

## 2020-10-25 MED ORDER — PHENYLEPHRINE HCL-NACL 10-0.9 MG/250ML-% IV SOLN: INTRAVENOUS | Status: DC | PRN

## 2020-10-25 MED ORDER — PROPOFOL 10 MG/ML IV BOLUS
INTRAVENOUS | Status: DC | PRN
  Administered 2020-10-25: 150 mg via INTRAVENOUS

## 2020-10-25 MED ORDER — DIPHENHYDRAMINE HCL 12.5 MG/5ML PO ELIX: 12.5000 mg | ORAL_SOLUTION | Freq: Four times a day (QID) | ORAL | Status: DC | PRN

## 2020-10-25 MED ORDER — LACTATED RINGERS IV SOLN: INTRAVENOUS | Status: DC

## 2020-10-25 MED ORDER — PHENYLEPHRINE 40 MCG/ML (10ML) SYRINGE FOR IV PUSH (FOR BLOOD PRESSURE SUPPORT)
PREFILLED_SYRINGE | INTRAVENOUS | Status: DC | PRN
  Administered 2020-10-25: 120 ug via INTRAVENOUS
  Administered 2020-10-25: 80 ug via INTRAVENOUS

## 2020-10-25 MED ORDER — ORAL CARE MOUTH RINSE: 15.0000 mL | Freq: Once | OROMUCOSAL | Status: AC

## 2020-10-25 MED ORDER — ENSURE PRE-SURGERY PO LIQD: 296.0000 mL | Freq: Once | ORAL | Status: DC

## 2020-10-25 MED ORDER — PHENYLEPHRINE HCL-NACL 10-0.9 MG/250ML-% IV SOLN
INTRAVENOUS | Status: DC | PRN
  Administered 2020-10-25: 50 ug/min via INTRAVENOUS

## 2020-10-25 MED ORDER — BUPIVACAINE-EPINEPHRINE (PF) 0.25% -1:200000 IJ SOLN
INTRAMUSCULAR | Status: DC | PRN
  Administered 2020-10-25: 60 mL

## 2020-10-25 MED ORDER — GABAPENTIN 300 MG PO CAPS
ORAL_CAPSULE | ORAL | Status: AC
  Administered 2020-10-25: 300 mg via ORAL
  Filled 2020-10-25: qty 1

## 2020-10-25 MED ORDER — HYDROMORPHONE HCL 1 MG/ML IJ SOLN
INTRAMUSCULAR | Status: AC
  Filled 2020-10-25: qty 1

## 2020-10-25 MED ORDER — BUPIVACAINE LIPOSOME 1.3 % IJ SUSP
20.0000 mL | Freq: Once | INTRAMUSCULAR | Status: DC
  Filled 2020-10-25: qty 20

## 2020-10-25 MED ORDER — SIMETHICONE 80 MG PO CHEW
40.0000 mg | CHEWABLE_TABLET | Freq: Four times a day (QID) | ORAL | Status: DC | PRN
Start: 2020-10-25 — End: 2020-10-26

## 2020-10-25 MED ORDER — GABAPENTIN 100 MG PO CAPS
200.0000 mg | ORAL_CAPSULE | Freq: Three times a day (TID) | ORAL | Status: DC
  Administered 2020-10-25 – 2020-10-26 (×2): 200 mg via ORAL
  Filled 2020-10-25 (×2): qty 2

## 2020-10-25 MED ORDER — METOPROLOL TARTRATE 12.5 MG HALF TABLET
12.5000 mg | ORAL_TABLET | Freq: Two times a day (BID) | ORAL | Status: DC
  Administered 2020-10-25 – 2020-10-26 (×2): 12.5 mg via ORAL
  Filled 2020-10-25 (×2): qty 1

## 2020-10-25 MED ORDER — MIDAZOLAM HCL 2 MG/2ML IJ SOLN: 0.5000 mg | Freq: Once | INTRAMUSCULAR | Status: DC | PRN

## 2020-10-25 MED ORDER — ADULT MULTIVITAMIN W/MINERALS CH
1.0000 | ORAL_TABLET | Freq: Every day | ORAL | Status: DC
  Administered 2020-10-26: 1 via ORAL
  Filled 2020-10-25: qty 1

## 2020-10-25 MED ORDER — POLYETHYLENE GLYCOL 3350 17 G PO PACK
17.0000 g | PACK | Freq: Two times a day (BID) | ORAL | Status: DC
  Administered 2020-10-25 – 2020-10-26 (×2): 17 g via ORAL
  Filled 2020-10-25 (×2): qty 1

## 2020-10-25 MED ORDER — ONDANSETRON HCL 4 MG/2ML IJ SOLN
INTRAMUSCULAR | Status: AC
  Filled 2020-10-25: qty 2

## 2020-10-25 MED ORDER — ONDANSETRON HCL 4 MG/2ML IJ SOLN
INTRAMUSCULAR | Status: DC | PRN
  Administered 2020-10-25: 4 mg via INTRAVENOUS

## 2020-10-25 MED ORDER — METOPROLOL TARTRATE 5 MG/5ML IV SOLN: 5.0000 mg | Freq: Four times a day (QID) | INTRAVENOUS | Status: DC | PRN

## 2020-10-25 MED ORDER — 0.9 % SODIUM CHLORIDE (POUR BTL) OPTIME
TOPICAL | Status: DC | PRN
  Administered 2020-10-25: 1000 mL

## 2020-10-25 MED ORDER — HYDROCODONE-ACETAMINOPHEN 10-325 MG PO TABS
1.0000 | ORAL_TABLET | Freq: Three times a day (TID) | ORAL | Status: DC | PRN
  Administered 2020-10-25 – 2020-10-26 (×2): 1 via ORAL
  Filled 2020-10-25 (×2): qty 1

## 2020-10-25 MED ORDER — PROPOFOL 10 MG/ML IV BOLUS
INTRAVENOUS | Status: AC
  Filled 2020-10-25: qty 20

## 2020-10-25 MED ORDER — CHLORHEXIDINE GLUCONATE 0.12 % MT SOLN: 15.0000 mL | Freq: Once | OROMUCOSAL | Status: AC

## 2020-10-25 MED ORDER — ACETAMINOPHEN 500 MG PO TABS
ORAL_TABLET | ORAL | Status: AC
  Administered 2020-10-25: 1000 mg via ORAL
  Filled 2020-10-25: qty 2

## 2020-10-25 MED ORDER — CLINDAMYCIN PHOSPHATE 900 MG/50ML IV SOLN
900.0000 mg | INTRAVENOUS | Status: AC
  Administered 2020-10-25: 900 mg via INTRAVENOUS

## 2020-10-25 MED ORDER — ROCURONIUM BROMIDE 10 MG/ML (PF) SYRINGE
PREFILLED_SYRINGE | INTRAVENOUS | Status: DC | PRN
  Administered 2020-10-25: 90 mg via INTRAVENOUS

## 2020-10-25 MED ORDER — GABAPENTIN 300 MG PO CAPS: 300.0000 mg | ORAL_CAPSULE | ORAL | Status: AC

## 2020-10-25 MED ORDER — EPHEDRINE SULFATE-NACL 50-0.9 MG/10ML-% IV SOSY
PREFILLED_SYRINGE | INTRAVENOUS | Status: DC | PRN
  Administered 2020-10-25: 5 mg via INTRAVENOUS

## 2020-10-25 MED ORDER — CYCLOBENZAPRINE HCL 10 MG PO TABS: 10.0000 mg | ORAL_TABLET | Freq: Three times a day (TID) | ORAL | Status: DC | PRN

## 2020-10-25 MED ORDER — FENTANYL CITRATE (PF) 250 MCG/5ML IJ SOLN
INTRAMUSCULAR | Status: DC | PRN
  Administered 2020-10-25: 100 ug via INTRAVENOUS
  Administered 2020-10-25: 50 ug via INTRAVENOUS

## 2020-10-25 MED ORDER — SODIUM CHLORIDE 0.9 % IV SOLN: 250.0000 mL | INTRAVENOUS | Status: DC | PRN

## 2020-10-25 MED ORDER — DEXAMETHASONE SODIUM PHOSPHATE 10 MG/ML IJ SOLN
INTRAMUSCULAR | Status: DC | PRN
  Administered 2020-10-25: 5 mg via INTRAVENOUS

## 2020-10-25 MED ORDER — HYDROCHLOROTHIAZIDE 25 MG PO TABS
25.0000 mg | ORAL_TABLET | Freq: Every day | ORAL | Status: DC
  Administered 2020-10-26: 25 mg via ORAL
  Filled 2020-10-25: qty 1

## 2020-10-25 MED ORDER — FESOTERODINE FUMARATE ER 8 MG PO TB24
8.0000 mg | ORAL_TABLET | Freq: Every day | ORAL | Status: DC
  Administered 2020-10-26: 8 mg via ORAL
  Filled 2020-10-25: qty 1

## 2020-10-25 MED ORDER — PHENYLEPHRINE 40 MCG/ML (10ML) SYRINGE FOR IV PUSH (FOR BLOOD PRESSURE SUPPORT)
PREFILLED_SYRINGE | INTRAVENOUS | Status: AC
  Filled 2020-10-25: qty 10

## 2020-10-25 MED ORDER — GENTAMICIN SULFATE 40 MG/ML IJ SOLN
5.0000 mg/kg | INTRAVENOUS | Status: AC
  Administered 2020-10-25: 440 mg via INTRAVENOUS
  Filled 2020-10-25: qty 11

## 2020-10-25 MED ORDER — FENTANYL CITRATE (PF) 250 MCG/5ML IJ SOLN
INTRAMUSCULAR | Status: AC
  Filled 2020-10-25: qty 5

## 2020-10-25 MED ORDER — PROMETHAZINE HCL 25 MG/ML IJ SOLN: 6.2500 mg | INTRAMUSCULAR | Status: DC | PRN

## 2020-10-25 MED ORDER — SUGAMMADEX SODIUM 200 MG/2ML IV SOLN
INTRAVENOUS | Status: DC | PRN
  Administered 2020-10-25: 200 mg via INTRAVENOUS

## 2020-10-25 MED ORDER — SCOPOLAMINE 1 MG/3DAYS TD PT72
1.0000 | MEDICATED_PATCH | TRANSDERMAL | Status: DC
  Administered 2020-10-25: 1.5 mg via TRANSDERMAL
  Filled 2020-10-25: qty 1

## 2020-10-25 MED ORDER — DIPHENHYDRAMINE HCL 50 MG/ML IJ SOLN: 12.5000 mg | Freq: Four times a day (QID) | INTRAMUSCULAR | Status: DC | PRN

## 2020-10-25 MED ORDER — BUPIVACAINE LIPOSOME 1.3 % IJ SUSP
INTRAMUSCULAR | Status: DC | PRN
  Administered 2020-10-25: 20 mL

## 2020-10-25 MED ORDER — OXYCODONE HCL 5 MG/5ML PO SOLN: 5.0000 mg | Freq: Once | ORAL | Status: DC | PRN

## 2020-10-25 MED ORDER — MAGIC MOUTHWASH
15.0000 mL | Freq: Four times a day (QID) | ORAL | Status: DC | PRN
  Filled 2020-10-25: qty 15

## 2020-10-25 MED ORDER — ACETAMINOPHEN 500 MG PO TABS: 1000.0000 mg | ORAL_TABLET | Freq: Once | ORAL | Status: DC

## 2020-10-25 SURGICAL SUPPLY — 61 items
ADH SKN CLS APL DERMABOND .7 (GAUZE/BANDAGES/DRESSINGS) ×1
APL PRP STRL LF DISP 70% ISPRP (MISCELLANEOUS) ×1
APPLIER CLIP LOGIC TI 5 (MISCELLANEOUS) IMPLANT
APR CLP MED LRG 33X5 (MISCELLANEOUS)
BINDER ABDOMINAL 12 ML 46-62 (SOFTGOODS) IMPLANT
BLADE CLIPPER SURG (BLADE) IMPLANT
CANISTER SUCT 3000ML PPV (MISCELLANEOUS) ×2 IMPLANT
CHLORAPREP W/TINT 26 (MISCELLANEOUS) ×2 IMPLANT
CLSR STERI-STRIP ANTIMIC 1/2X4 (GAUZE/BANDAGES/DRESSINGS) ×1 IMPLANT
CNTNR URN SCR LID CUP LEK RST (MISCELLANEOUS) IMPLANT
CONT SPEC 4OZ STRL OR WHT (MISCELLANEOUS) ×2
COVER SURGICAL LIGHT HANDLE (MISCELLANEOUS) ×2 IMPLANT
COVER WAND RF STERILE (DRAPES) ×2 IMPLANT
DERMABOND ADVANCED (GAUZE/BANDAGES/DRESSINGS) ×1
DERMABOND ADVANCED .7 DNX12 (GAUZE/BANDAGES/DRESSINGS) IMPLANT
DEVICE SECURE STRAP 25 ABSORB (INSTRUMENTS) ×1 IMPLANT
DEVICE TROCAR PUNCTURE CLOSURE (ENDOMECHANICALS) ×2 IMPLANT
DRSG TEGADERM 2-3/8X2-3/4 SM (GAUZE/BANDAGES/DRESSINGS) ×6 IMPLANT
DRSG TEGADERM 4X4.75 (GAUZE/BANDAGES/DRESSINGS) ×2 IMPLANT
ELECT REM PT RETURN 9FT ADLT (ELECTROSURGICAL) ×2 IMPLANT
ELECTRODE REM PT RTRN 9FT ADLT (ELECTROSURGICAL) ×1 IMPLANT
GAUZE SPONGE 2X2 8PLY STRL LF (GAUZE/BANDAGES/DRESSINGS) ×1 IMPLANT
GLOVE ECLIPSE 8.0 STRL XLNG CF (GLOVE) ×2 IMPLANT
GLOVE INDICATOR 8.0 STRL GRN (GLOVE) ×2 IMPLANT
GOWN STRL REUS W/ TWL LRG LVL3 (GOWN DISPOSABLE) ×2 IMPLANT
GOWN STRL REUS W/ TWL XL LVL3 (GOWN DISPOSABLE) ×1 IMPLANT
GOWN STRL REUS W/TWL LRG LVL3 (GOWN DISPOSABLE) ×4
GOWN STRL REUS W/TWL XL LVL3 (GOWN DISPOSABLE) ×2
KIT BASIN OR (CUSTOM PROCEDURE TRAY) ×2 IMPLANT
KIT TURNOVER KIT B (KITS) ×2 IMPLANT
MARKER SKIN DUAL TIP RULER LAB (MISCELLANEOUS) ×2 IMPLANT
MESH VENTRALIGHT ST 10X13IN (Mesh General) ×1 IMPLANT
NDL INSUFFLATION 14GA 120MM (NEEDLE) IMPLANT
NDL SPNL 22GX3.5 QUINCKE BK (NEEDLE) ×1 IMPLANT
NEEDLE 22X1 1/2 (OR ONLY) (NEEDLE) ×2 IMPLANT
NEEDLE INSUFFLATION 14GA 120MM (NEEDLE) IMPLANT
NEEDLE SPNL 22GX3.5 QUINCKE BK (NEEDLE) ×2 IMPLANT
NS IRRIG 1000ML POUR BTL (IV SOLUTION) ×4 IMPLANT
PAD ARMBOARD 7.5X6 YLW CONV (MISCELLANEOUS) ×4 IMPLANT
SCISSORS LAP 5X35 DISP (ENDOMECHANICALS) ×2 IMPLANT
SET IRRIG TUBING LAPAROSCOPIC (IRRIGATION / IRRIGATOR) ×2 IMPLANT
SET TUBE SMOKE EVAC HIGH FLOW (TUBING) ×2 IMPLANT
SHEARS HARMONIC ACE PLUS 36CM (ENDOMECHANICALS) IMPLANT
SLEEVE ENDOPATH XCEL 5M (ENDOMECHANICALS) IMPLANT
SPONGE GAUZE 2X2 STER 10/PKG (GAUZE/BANDAGES/DRESSINGS) ×1
STRIP CLOSURE SKIN 1/2X4 (GAUZE/BANDAGES/DRESSINGS) ×4 IMPLANT
SUT MNCRL AB 4-0 PS2 18 (SUTURE) ×2 IMPLANT
SUT PDS AB 1 CT  36 (SUTURE) ×6
SUT PDS AB 1 CT 36 (SUTURE) ×2 IMPLANT
SUT PROLENE 1 CT (SUTURE) ×11 IMPLANT
SUT VIC AB 3-0 SH 18 (SUTURE) ×1 IMPLANT
SUT VICRYL 0 UR6 27IN ABS (SUTURE) IMPLANT
TOWEL GREEN STERILE (TOWEL DISPOSABLE) ×2 IMPLANT
TOWEL GREEN STERILE FF (TOWEL DISPOSABLE) ×2 IMPLANT
TRAY FOLEY W/BAG SLVR 16FR (SET/KITS/TRAYS/PACK)
TRAY FOLEY W/BAG SLVR 16FR ST (SET/KITS/TRAYS/PACK) IMPLANT
TRAY LAPAROSCOPIC MC (CUSTOM PROCEDURE TRAY) ×2 IMPLANT
TROCAR ADV FIXATION 5X100MM (TROCAR) ×4 IMPLANT
TROCAR XCEL NON-BLD 11X100MML (ENDOMECHANICALS) IMPLANT
TROCAR XCEL NON-BLD 5MMX100MML (ENDOMECHANICALS) ×2 IMPLANT
WATER STERILE IRR 1000ML POUR (IV SOLUTION) ×2 IMPLANT

## 2020-10-25 NOTE — Anesthesia Procedure Notes (Signed)
Procedure Name: Intubation Date/Time: 10/25/2020 4:40 PM Performed by: Rande Brunt, CRNA Pre-anesthesia Checklist: Patient identified, Emergency Drugs available, Suction available and Patient being monitored Patient Re-evaluated:Patient Re-evaluated prior to induction Oxygen Delivery Method: Circle System Utilized Preoxygenation: Pre-oxygenation with 100% oxygen Induction Type: IV induction Ventilation: Mask ventilation without difficulty and Oral airway inserted - appropriate to patient size Laryngoscope Size: Mac and 3 Grade View: Grade I Tube type: Oral Tube size: 7.0 mm Number of attempts: 1 Airway Equipment and Method: Stylet and Oral airway Placement Confirmation: ETT inserted through vocal cords under direct vision, positive ETCO2 and breath sounds checked- equal and bilateral Secured at: 22 cm Tube secured with: Tape Dental Injury: Teeth and Oropharynx as per pre-operative assessment

## 2020-10-25 NOTE — Op Note (Signed)
10/25/2020  PATIENT:  [name]  70 y.o. female  Patient Care Team: Laurey Morale, MD as PCP - Huston Foley, MD as Consulting Physician (General Surgery)  PRE-OPERATIVE DIAGNOSIS:  VENTRAL INCISIONAL ABDOMINAL WALL HERNIA  POST-OPERATIVE DIAGNOSIS:  VENTRAL INCISIONAL INCARCERATED ABDOMINAL WALL HERNIAS  PROCEDURE:   LAPAROSCOPIC REPAIR OF  INCISIONAL INCARCERATED ABDOMINAL WALL HERNIA WITH MESH  TAP BLOCK - BILATERAL  SURGEON:  Adin Hector, MD  ASSISTANT: .Lucas Mallow, MD, PGY 6 [employer]  ANESTHESIA:     General  Regional TRANSVERSUS ABDOMINIS PLANE (TAP) nerve block for perioperative & postoperative pain control provided with liposomal bupivacaine (Experel) mixed with 0.25% bupivacaine as a Bilateral TAP block x 47mL each side at the level of the transverse abdominis & preperitoneal spaces along the flank at the anterior axillary line, from subcostal ridge to iliac crest under laparoscopic guidance   EBL:  Total I/O In: 661 [I.V.:500; IV Piggyback:161] Out: 110 [Other:100; Blood:10]  Per anesthesia record  Delay start of Pharmacological VTE agent (>24hrs) due to surgical blood loss or risk of bleeding:  no  DRAINS: none   SPECIMEN:  No Specimen  DISPOSITION OF SPECIMEN:  N/A  COUNTS:  YES  PLAN OF CARE: Admit for overnight observation  PATIENT DISPOSITION:  PACU - hemodynamically stable.  INDICATION: Pleasant patient has developed a ventral wall abdominal hernia. Recommendation was made for surgical repair  The anatomy & physiology of the abdominal wall was discussed. The pathophysiology of hernias was discussed. Natural history risks without surgery including progeressive enlargement, pain, incarceration & strangulation was discussed. Contributors to complications such as smoking, obesity, diabetes, prior surgery, etc were discussed.  I feel the risks of no intervention will lead to serious problems that outweigh the operative risks;  therefore, I recommended surgery to reduce and repair the hernia. I explained laparoscopic techniques with possible need for an open approach. I noted the probable use of mesh to patch and/or buttress the hernia repair.  Risks such as bleeding, infection, abscess, need for further treatment, heart attack, death, and other risks were discussed. I noted a good likelihood this will help address the problem. Goals of post-operative recovery were discussed as well. Possibility that this will not correct all symptoms was explained. I stressed the importance of low-impact activity, aggressive pain control, avoiding constipation, & not pushing through pain to minimize risk of post-operative chronic pain or injury. Possibility of reherniation especially with smoking, obesity, diabetes, immunosuppression, and other health conditions was discussed. We will work to minimize complications.   An educational handout further explaining the pathology & treatment options was given as well. Questions were answered. The patient expresses understanding & wishes to proceed with surgery.   OR FINDINGS: Swiss cheese type hernias.  Largest one at old colostomy site in left upper quadrant.  Also periumbilical incision.  18 x 6 cm region.  Primarily vertical  Type of repair: Laparoscopic underlay repair.  Primary repair of largest hernia at old colostomy   Placement of mesh: Centrally intraperitoneal with edges tucked into RECTRORECTUS & preperitoneal space  Name of mesh: Bard Ventralight dual sided (polypropylene / Seprafilm)  Size of mesh: 33x27cm  Orientation: Vertical  Mesh overlap:  5-7cm   DESCRIPTION:   Informed consent was confirmed. The patient underwent general anaesthesia without difficulty. The patient was positioned appropriately. VTE prevention in place. The patient's abdomen was clipped, prepped, & draped in a sterile fashion. Surgical timeout confirmed our plan.  The patient was positioned in reverse  Trendelenburg. Abdominal entry was gained using optical entry technique in the left upper abdomen. Entry was clean. I induced carbon dioxide insufflation. Camera inspection revealed no injury. Extra ports were carefully placed under direct laparoscopic visualization.   I could see hernias on the parietal peritoneum under the abdominal wall.  We did laparoscopic lysis of adhesions to expose the entire anterior abdominal wall.  I primarily used focused sharp dissection.  I freed off the falciform ligament and central peritoneum to expose the retrorectus fascia   I made sure hemostasis was good.  I mapped out the region using a needle passer.   To ensure that I would have at least 5 cm radial coverage outside of the hernia defect, I chose a 33x27cm dual sided mesh.  I placed #1 Prolene stitches around its edge on the lower 3/4 about every 5 cm = 9 total.  I rolled the mesh & placed into the peritoneal cavity through the largest hernia defect.  I unrolled the mesh and positioned it appropriately.  I secured the mesh to cover up the hernia defect using a laparoscopic suture passer to pass the tails of the Prolene through the abdominal wall & tagged them with clamps for good transfascial suturing.  I started out in four corners to make sure I had the mesh centered under the hernia defect appropriately, and then proceeded to work in quadrants.    We evacuated CO2 & desufflated the abdomen.  I tied the fascial stitches down. I closed the fascial defect that I placed the mesh through using #1 PDS interrupted transverse stitches primarily.  I reinsufflated the abdomen. The mesh provided at least circumferential coverage around the entire region of hernia defects.  We secured the mesh centrally with an additional trans fascial stitch in & out the mesh using #1 prolene under laparoscopic visualization.  The upper portion of the mesh actually went above the costal ridge to wean it up passing some extra #1 Prolene sutures  in and out the mesh and also tacking up the falciform ligament so that we can provide good overlap on the complex hernia as, especially the one that was resting in the left upper quadrant at the old colostomy site.  We use this to help bring the falciform ligament back up as well I tacked the edges & central part of the mesh to the peritoneum/posterior rectus fascia with SecureStrap absorbable tacks.   I did reinspection. Hemostasis was good. Mesh laid well. I completed a broad field block of local anesthesia at fascial stitch sites & fascial closure areas.    Capnoperitoneum was evacuated. Ports were removed. The skin was closed with Monocryl at the port sites and Steri-Strips on the fascial stitch puncture sites.  Patient is being extubated to go to the recovery room.  I discussed operative findings, updated the patient's status, discussed probable steps to recovery, and gave postoperative recommendations to the patient's spouse, Otis Spratling.  .  Recommendations were made.  Questions were answered.  He expressed understanding & appreciation.  Adin Hector, M.D., F.A.C.S. Gastrointestinal and Minimally Invasive Surgery [facility] [address], [place],  [address] [phone] Main / Paging  10/25/2020 6:48 PM

## 2020-10-25 NOTE — Transfer of Care (Signed)
Immediate Anesthesia Transfer of Care Note  Patient: [name]  Procedure(s) Performed: LAPAROSCOPIC VENTRAL WALL HERNIA REPAIR (Abdomen) LYSIS OF ADHESION (Abdomen) INSERTION OF MESH (Abdomen)  Patient Location: PACU  Anesthesia Type:General  Level of Consciousness: awake, alert  and oriented  Airway & Oxygen Therapy: Patient Spontanous Breathing and Patient connected to face mask oxygen  Post-op Assessment: Report given to RN and Post -op Vital signs reviewed and stable  Post vital signs: Reviewed and stable  Last Vitals:  Vitals Value Taken Time  BP 106/57 10/25/20 1900  Temp    Pulse 86 10/25/20 1901  Resp 26 10/25/20 1901  SpO2 99 % 10/25/20 1901  Vitals shown include unvalidated device data.  Last Pain:  Vitals:   10/25/20 1212  TempSrc:   PainSc: 0-No pain         Complications: No notable events documented.

## 2020-10-25 NOTE — Anesthesia Preprocedure Evaluation (Addendum)
Anesthesia Evaluation  Patient identified by MRN, date of birth, ID band Patient awake    Reviewed: Allergy & Precautions, NPO status , Patient's Chart, lab work & pertinent test results, reviewed documented beta blocker date and time   History of Anesthesia Complications Negative for: history of anesthetic complications  Airway Mallampati: I  TM Distance: >3 FB Neck ROM: Full    Dental  (+) Edentulous Upper, Edentulous Lower   Pulmonary former smoker,  10/21/2020 SARS coronavirus NEG   breath sounds clear to auscultation       Cardiovascular hypertension, Pt. on medications and Pt. on home beta blockers (-) angina Rhythm:Regular Rate:Normal     Neuro/Psych  Headaches,    GI/Hepatic negative GI ROS, Neg liver ROS,   Endo/Other  Morbid obesity  Renal/GU negative Renal ROS     Musculoskeletal  (+) Arthritis ,   Abdominal (+) + obese,   Peds  Hematology negative hematology ROS (+)   Anesthesia Other Findings   Reproductive/Obstetrics                            Anesthesia Physical Anesthesia Plan  ASA: 3  Anesthesia Plan: General   Post-op Pain Management:    Induction: Intravenous  PONV Risk Score and Plan: 3 and Ondansetron, Dexamethasone, Treatment may vary due to age or medical condition and Scopolamine patch - Pre-op  Airway Management Planned: Oral ETT  Additional Equipment: None  Intra-op Plan:   Post-operative Plan: Extubation in OR  Informed Consent: I have reviewed the patients History and Physical, chart, labs and discussed the procedure including the risks, benefits and alternatives for the proposed anesthesia with the patient or authorized representative who has indicated his/her understanding and acceptance.       Plan Discussed with: CRNA and Surgeon  Anesthesia Plan Comments:        Anesthesia Quick Evaluation

## 2020-10-25 NOTE — Interval H&P Note (Signed)
History and Physical Interval Note:  10/25/2020 3:11 PM  [name]  has presented today for surgery, with the diagnosis of [place].  The various methods of treatment have been discussed with the patient and family. After consideration of risks, benefits and other options for treatment, the patient has consented to  Procedure(s): [place] (N/A) LYSIS OF ADHESION (N/A) as a surgical intervention.  The patient's history has been reviewed, patient examined, no change in status, stable for surgery.  I have reviewed the patient's chart and labs.  Questions were answered to the patient's satisfaction.    I have re-reviewed the the patient's records, history, medications, and allergies.  I have re-examined the patient.  I again discussed intraoperative plans and goals of post-operative recovery.  The patient agrees to proceed.  [name]  Oct 19, 1950 [id number]  Patient Care Team: Laurey Morale, MD as PCP - Huston Foley, MD as Consulting Physician (General Surgery)  Patient Active Problem List   Diagnosis Date Noted   Incisional hernia 08/20/2020   Obesity (BMI 30-39.9) 08/20/2020   Sciatic neuralgia 08/20/2020   Chronic neck pain 08/20/2020   Chronic narcotic dependence ([place]) 08/20/2020   History of colonic diverticulitis 08/20/2020   Irritable bowel syndrome with both constipation and diarrhea 06/11/2017   Tobacco abuse 05/03/2012   HTN (hypertension) 12/09/2011   INFLAMED SEBORRHEIC KERATOSIS 04/29/2010   DEGENERATIVE [place] DISEASE, CERVICAL SPINE 04/29/2010   EDEMA 01/01/2009   CERUMEN IMPACTION 10/19/2008   HEADACHE 10/19/2008   URINARY INCONTINENCE 10/19/2008    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/20/2020   Dyspnea    History of colostomy reversal 12/07/2019   Hypertension     Large bowel obstruction ([place]) 04/13/2019   Neuromuscular disorder ([place])    nerve pain in neck   Neuropathy    Urinary incontinence    Wears contact lenses    Wears dentures     Past Surgical History:  Procedure Laterality Date   ABDOMINAL HYSTERECTOMY     oophorectomy   APPENDECTOMY     CERVICAL FUSION  2009   and plating   COLECTOMY WITH COLOSTOMY CREATION/HARTMANN PROCEDURE N/A 04/14/2019   Procedure: SIGMOID COLECTOMY, RESECTION OF SMALL BOWEL,WITH END COLOSTOMY, PROCTOSCOPY RIGID;  Surgeon: Alphonsa Overall, MD;  Location: [facility];  Service: General;  Laterality: N/A;   COLONOSCOPY  08/29/2019   per Dr. Hilarie Fredrickson, diverticulosis and one benign polyp, repeat in 10  yrs    COLOSTOMY TAKEDOWN N/A 12/07/2019   Procedure: LAPAROSCOPIC COLOSTOMY REVERSAL;  Surgeon: Alphonsa Overall, MD;  Location: [facility];  Service: General;  Laterality: N/A;   CYSTOSCOPY WITH STENT PLACEMENT Bilateral 04/14/2019   Procedure: CYSTOSCOPY WITH BILATERAL STENT PLACEMENT;  Surgeon: Robley Fries, MD;  Location: [facility];  Service: Urology;  Laterality: Bilateral;   DILATION AND CURETTAGE OF UTERUS     EAR CYST EXCISION Right 07/05/2014   Procedure: EXCISION FLEXOR SHEATH CYST WITH RELEASE A-1 PULLEY RIGHT SMALL FINGER;  Surgeon: Leanora Cover, MD;  [facility];  Service: Orthopedics;  Laterality: Right;   EYE SURGERY Bilateral    cataract surgery   SPINE SURGERY     STOMACH SURGERY     TRIGGER FINGER RELEASE Right 07/05/2014   Procedure: RELEASE RIGHT SMALL FINGER/A-1 PULLEY;  Surgeon: Leanora Cover, MD;  Location: [facility];  Service: Orthopedics;  Laterality: Right;   TUBOPLASTY / TUBOTUBAL ANASTOMOSIS      Social History   Socioeconomic History   Marital status: Married    Spouse name: Not on file   Number of children: Not on file   Years of education: Not on file   Highest education level: Not on file  Occupational History   Not on file  Tobacco Use   Smoking  status: Former    Packs/[place]agner: 0.50    Pack years: 0.00    Types: Cigarettes    Quit date: 06/02/2019    Years since quitting: 1.4   Smokeless tobacco: Never   Tobacco comments:    3 weeks ago  Vaping Use   Vaping Use: Never used  Substance and Sexual Activity   Alcohol use: No    Alcohol/week: 0.0 standard drinks   Drug use: Never   Sexual activity: Not on file  Other Topics Concern   Not on file  Social History Narrative   Not on file   Social Determinants of Health   Financial Resource Strain: Low Risk    Difficulty of Paying Living Expenses: Not hard at all  Food Insecurity: No Food Insecurity   Worried About Charity fundraiser in the Last Year: Never true   [place] in the Last Year: Never true  Transportation Needs: No Transportation Needs   Lack of Transportation (Medical): No   Lack of Transportation (Non-Medical): No  Physical Activity: Sufficiently Active   Days of Exercise per Week: 7 days   Minutes of Exercise per Session: 40 min  Stress: Not on file  Social Connections: Moderately Integrated   Frequency of Communication with Friends and Family: More than three times a week   Frequency of Social Gatherings with Friends and Family: Twice a week   Attends Religious Services: More than 4 times per year   Active Member of [employer] or Organizations: No   Attends Music therapist: Never   Marital Status: Married  Human resources officer Violence: Not At Risk   Fear of Current or Ex-Partner: No   Emotionally Abused: No   Physically Abused: No   Sexually Abused: No    Family History  Problem Relation Age of Onset   Arthritis Other    Breast cancer Other    Hypertension Other    Breast cancer Mother    Colon cancer Neg Hx    Colon polyps Neg Hx    Esophageal cancer Neg Hx    Stomach cancer Neg Hx    Rectal cancer Neg Hx     Medications Prior to Admission  Medication Sig Dispense Refill Last Dose   Ascorbic Acid (VITAMIN C PO) Take 1,000 mg by  mouth daily. Ester C   Past Week   cyclobenzaprine (FLEXERIL) 10 MG tablet Take 1 tablet (10 mg total) by mouth 3 (three) times daily as needed for muscle spasms. 90 tablet 2 Past Week   hydrochlorothiazide (HYDRODIURIL) 25 MG tablet Take 1 tablet (25 mg total) by mouth daily. 90 tablet 3 10/24/2020   HYDROcodone-acetaminophen (NORCO) 10-325 MG tablet Take 1 tablet by mouth every 8 (eight) hours as needed for moderate pain. 90 tablet 0 10/25/2020 at 0600   metoprolol succinate (TOPROL-XL) 100 MG 24 hr tablet Take 1 tablet (100 mg total) by mouth in the morning and at bedtime. 180 tablet 3 10/25/2020 at 0600   Multiple Vitamins-Minerals (MULTIVITAMIN WITH MINERALS) tablet Take 1 tablet by  mouth daily.   Past Week   tolterodine (DETROL LA) 4 MG 24 hr capsule Take 1 capsule (4 mg total) by mouth daily. 30 capsule 5 10/25/2020 at 0600    Current Facility-Administered Medications  Medication Dose Route Frequency Provider Last Rate Last Admin   acetaminophen (TYLENOL) tablet 1,000 mg  1,000 mg Oral Once Annye Asa, MD       bupivacaine liposome (EXPAREL) 1.3 % injection 266 mg  20 mL Infiltration Once Michael Boston, MD       Chlorhexidine Gluconate Cloth 2 % PADS 6 each  6 each Topical Once Michael Boston, MD       And   Chlorhexidine Gluconate Cloth 2 % PADS 6 each  6 each Topical Once Michael Boston, MD       clindamycin (CLEOCIN) 900 MG/50ML IVPB            clindamycin (CLEOCIN) IVPB 900 mg  900 mg Intravenous On Call to OR Michael Boston, MD       And   gentamicin (GARAMYCIN) 440 mg in dextrose 5 % 100 mL IVPB  5 mg/kg Intravenous On Call to OR Michael Boston, MD       [START ON 10/26/2020] feeding supplement (ENSURE PRE-SURGERY) liquid 296 mL  296 mL Oral Once Michael Boston, MD       feeding supplement (ENSURE PRE-SURGERY) liquid 592 mL  592 mL Oral Once Michael Boston, MD       lactated ringers infusion   Intravenous Continuous Nolon Nations, MD 10 mL/hr at 10/25/20 1214 New Bag at 10/25/20 1214    scopolamine (TRANSDERM-SCOP) 1 MG/3DAYS 1.5 mg  1 patch Transdermal Q72H Annye Asa, MD         Allergies  Allergen Reactions   Lisinopril Anaphylaxis and Swelling    Tongue swelling   Penicillins Anaphylaxis and Hives    Reaction: teen years   Egg [Eggs Or Egg-Derived Products] Nausea Only   Other     Strawberry-hives   Adhesive [Tape] Rash    BP (!) 149/70   Pulse 88   Temp 98.1 F (36.7 C) (Oral)   Resp 18   Ht 5\' 4"  (1.626 m)   Wt 88.8 kg   SpO2 100%   BMI 33.61 kg/m   Labs: No results found for this or any previous visit (from the past 48 hour(s)).  Imaging / Studies: No results found.   Adin Hector, M.D., F.A.C.S. Gastrointestinal and Minimally Invasive Surgery [facility] [address] [place], [place] [address] [phone] Main / Paging  10/25/2020 3:11 PM    Adin Hector

## 2020-10-25 NOTE — Anesthesia Postprocedure Evaluation (Signed)
Anesthesia Post Note  Patient: [name]  Procedure(s) Performed: LAPAROSCOPIC VENTRAL WALL HERNIA REPAIR (Abdomen) LYSIS OF ADHESION (Abdomen) INSERTION OF MESH (Abdomen)     Patient location during evaluation: PACU Anesthesia Type: General Level of consciousness: awake and alert Pain management: pain level controlled Vital Signs Assessment: post-procedure vital signs reviewed and stable Respiratory status: spontaneous breathing, nonlabored ventilation, respiratory function stable and patient connected to nasal cannula oxygen Cardiovascular status: blood pressure returned to baseline and stable Postop Assessment: no apparent nausea or vomiting Anesthetic complications: no   No notable events documented.  Last Vitals:  Vitals:   10/25/20 1915 10/25/20 1930  BP: (!) 103/57 (!) 100/58  Pulse: 86 85  Resp: 20 20  Temp:    SpO2: 93% 94%    Last Pain:  Vitals:   10/25/20 1915  TempSrc:   PainSc: [place]

## 2020-10-25 NOTE — Discharge Instructions (Signed)
HERNIA REPAIR: POST OP INSTRUCTIONS  ######################################################################  EAT Gradually transition to a high fiber diet with a fiber supplement over the next few weeks after discharge.  Start with a pureed / full liquid diet (see below)  WALK Walk an hour a Yandell.  Control your pain to do that.    CONTROL PAIN Control pain so that you can walk, sleep, tolerate sneezing/coughing, and go up/down stairs.  HAVE A BOWEL MOVEMENT DAILY Keep your bowels regular to avoid problems.  OK to try a laxative to override constipation.  OK to use an antidairrheal to slow down diarrhea.  Call if not better after 2 tries  CALL IF YOU HAVE PROBLEMS/CONCERNS Call if you are still struggling despite following these instructions. Call if you have concerns not answered by these instructions  ######################################################################    DIET: Follow a light bland diet & liquids the first 24 hours after arrival home, such as soup, liquids, starches, etc.  Be sure to drink plenty of fluids.  Quickly advance to a usual solid diet within a few days.  Avoid fast food or heavy meals as your are more likely to get nauseated or have irregular bowels.  A low-fat, high-fiber diet for the rest of your life is ideal.   Take your usually prescribed home medications unless otherwise directed.  PAIN CONTROL: Pain is best controlled by a usual combination of three different methods TOGETHER: Ice/Heat Over the counter pain medication Prescription pain medication Most patients will experience some swelling and bruising around the hernia(s) such as the bellybutton, groins, or old incisions.  Ice packs or heating pads (30-60 minutes up to 6 times a Savarino) will help. Use ice for the first few days to help decrease swelling and bruising, then switch to heat to help relax tight/sore spots and speed recovery.  Some people prefer to use ice alone, heat alone, alternating  between ice & heat.  Experiment to what works for you.  Swelling and bruising can take several weeks to resolve.   It is helpful to take an over-the-counter pain medication regularly for the first few weeks.  Choose one of the following that works best for you: Naproxen (Aleve, etc)  Two 220mg tabs twice a Eberwein Ibuprofen (Advil, etc) Three 200mg tabs four times a Duan (every meal & bedtime) Acetaminophen (Tylenol, etc) 325-650mg four times a Mesta (every meal & bedtime) A  prescription for pain medication should be given to you upon discharge.  Take your pain medication as prescribed.  If you are having problems/concerns with the prescription medicine (does not control pain, nausea, vomiting, rash, itching, etc), please call us [phone] to see if we need to switch you to a different pain medicine that will work better for you and/or control your side effect better. If you need a refill on your pain medication, please contact your pharmacy.  They will contact our office to request authorization. Prescriptions will not be filled after 5 pm or on week-ends.  Avoid getting constipated.  Between the surgery and the pain medications, it is common to experience some constipation.  Increasing fluid intake and taking a fiber supplement (such as Metamucil, Citrucel, FiberCon, MiraLax, etc) 1-2 times a Daher regularly will usually help prevent this problem from occurring.  A mild laxative (prune juice, Milk of Magnesia, MiraLax, etc) should be taken according to package directions if there are no bowel movements after 48 hours.    Wash / shower every Nakata.  You may shower over the dressings   as they are waterproof.    Remove your waterproof bandages, skin tapes, and other bandages 3 days after surgery. You may replace a dressing/Band-Aid to cover the incision for comfort if you wish. You may leave the incisions open to air.  You may replace a dressing/Band-Aid to cover an incision for comfort if you wish.  Continue  to shower over incision(s) after the dressing is off.  ACTIVITIES as tolerated:   You may resume regular (light) daily activities beginning the next Burack--such as daily self-care, walking, climbing stairs--gradually increasing activities as tolerated.  Control your pain so that you can walk an hour a Riso.  If you can walk 30 minutes without difficulty, it is safe to try more intense activity such as jogging, treadmill, bicycling, low-impact aerobics, swimming, etc. Save the most intensive and strenuous activity for last such as sit-ups, heavy lifting, contact sports, etc  Refrain from any heavy lifting or straining until you are off narcotics for pain control.   DO NOT PUSH THROUGH PAIN.  Let pain be your guide: If it hurts to do something, don't do it.  Pain is your body warning you to avoid that activity for another week until the pain goes down. You may drive when you are no longer taking prescription pain medication, you can comfortably wear a seatbelt, and you can safely maneuver your car and apply brakes. You may have sexual intercourse when it is comfortable.   FOLLOW UP in our office Please call [facility] at [phone] to set up an appointment to see your surgeon in the office for a follow-up appointment approximately 2-3 weeks after your surgery. Make sure that you call for this appointment the Troy you arrive home to insure a convenient appointment time.  9.  If you have disability of FMLA / Family leave forms, please bring the forms to the office for processing.  (do not give to your surgeon).  WHEN TO CALL US [phone]: Poor pain control Reactions / problems with new medications (rash/itching, nausea, etc)  Fever over 101.5 F (38.5 C) Inability to urinate Nausea and/or vomiting Worsening swelling or bruising Continued bleeding from incision. Increased pain, redness, or drainage from the incision   The clinic staff is available to answer your questions during regular business  hours (8:30am-5pm).  Please don't hesitate to call and ask to speak to one of our nurses for clinical concerns.   If you have a medical emergency, go to the nearest emergency room or call 911.  A surgeon from [facility] is always on call at the hospitals in [place]  [facility] [address], [address], [place], [place]  [address] ?  [address] [address], [place], [place]   [address] MAIN: [phone] ? TOLL FREE: [phone] ? FAX: [phone] [email or web address]  

## 2020-10-26 DIAGNOSIS — K430 Incisional hernia with obstruction, without gangrene: Secondary | ICD-10-CM | POA: Diagnosis not present

## 2020-10-26 LAB — MAGNESIUM: Magnesium: 1.7 mg/dL (ref 1.7–2.4)

## 2020-10-26 NOTE — Plan of Care (Signed)
Problem: Education: Goal: Knowledge of General Education information will improve Description: Including pain rating scale, medication(s)/side effects and non-pharmacologic comfort measures Outcome: Adequate for Discharge   Problem: Health Behavior/Discharge Planning: Goal: Ability to manage health-related needs will improve Outcome: Adequate for Discharge   Problem: Clinical Measurements: Goal: Ability to maintain clinical measurements within normal limits will improve Outcome: Adequate for Discharge Goal: Will remain free from infection Outcome: Adequate for Discharge Goal: Diagnostic test results will improve Outcome: Adequate for Discharge Goal: Respiratory complications will improve Outcome: Adequate for Discharge Goal: Cardiovascular complication will be avoided Outcome: Adequate for Discharge   Problem: Activity: Goal: Risk for activity intolerance will decrease Outcome: Adequate for Discharge   Problem: Nutrition: Goal: Adequate nutrition will be maintained Outcome: Adequate for Discharge   Problem: Coping: Goal: Level of anxiety will decrease Outcome: Adequate for Discharge   Problem: Elimination: Goal: Will not experience complications related to bowel motility Outcome: Adequate for Discharge Goal: Will not experience complications related to urinary retention Outcome: Adequate for Discharge   Problem: Pain Managment: Goal: General experience of comfort will improve Outcome: Adequate for Discharge   Problem: Safety: Goal: Ability to remain free from injury will improve Outcome: Adequate for Discharge   Problem: Skin Integrity: Goal: Risk for impaired skin integrity will decrease Outcome: Adequate for Discharge   Problem: Education: Goal: Knowledge of General Education information will improve Description: Including pain rating scale, medication(s)/side effects and non-pharmacologic comfort measures Outcome: Adequate for Discharge   Problem: Health  Behavior/Discharge Planning: Goal: Ability to manage health-related needs will improve Outcome: Adequate for Discharge   Problem: Clinical Measurements: Goal: Ability to maintain clinical measurements within normal limits will improve Outcome: Adequate for Discharge Goal: Will remain free from infection Outcome: Adequate for Discharge Goal: Diagnostic test results will improve Outcome: Adequate for Discharge Goal: Respiratory complications will improve Outcome: Adequate for Discharge Goal: Cardiovascular complication will be avoided Outcome: Adequate for Discharge   Problem: Activity: Goal: Risk for activity intolerance will decrease Outcome: Adequate for Discharge   Problem: Nutrition: Goal: Adequate nutrition will be maintained Outcome: Adequate for Discharge   Problem: Coping: Goal: Level of anxiety will decrease Outcome: Adequate for Discharge   Problem: Elimination: Goal: Will not experience complications related to bowel motility Outcome: Adequate for Discharge Goal: Will not experience complications related to urinary retention Outcome: Adequate for Discharge   Problem: Pain Managment: Goal: General experience of comfort will improve Outcome: Adequate for Discharge   Problem: Safety: Goal: Ability to remain free from injury will improve Outcome: Adequate for Discharge   Problem: Skin Integrity: Goal: Risk for impaired skin integrity will decrease Outcome: Adequate for Discharge   Problem: Education: Goal: Knowledge of General Education information will improve Description: Including pain rating scale, medication(s)/side effects and non-pharmacologic comfort measures Outcome: Adequate for Discharge   Problem: Health Behavior/Discharge Planning: Goal: Ability to manage health-related needs will improve Outcome: Adequate for Discharge   Problem: Clinical Measurements: Goal: Ability to maintain clinical measurements within normal limits will  improve Outcome: Adequate for Discharge Goal: Will remain free from infection Outcome: Adequate for Discharge Goal: Diagnostic test results will improve Outcome: Adequate for Discharge Goal: Respiratory complications will improve Outcome: Adequate for Discharge Goal: Cardiovascular complication will be avoided Outcome: Adequate for Discharge   Problem: Activity: Goal: Risk for activity intolerance will decrease Outcome: Adequate for Discharge   Problem: Nutrition: Goal: Adequate nutrition will be maintained Outcome: Adequate for Discharge   Problem: Coping: Goal: Level of anxiety will decrease Outcome: Adequate for  Discharge   Problem: Elimination: Goal: Will not experience complications related to bowel motility Outcome: Adequate for Discharge Goal: Will not experience complications related to urinary retention Outcome: Adequate for Discharge   Problem: Pain Managment: Goal: General experience of comfort will improve Outcome: Adequate for Discharge   Problem: Safety: Goal: Ability to remain free from injury will improve Outcome: Adequate for Discharge   Problem: Skin Integrity: Goal: Risk for impaired skin integrity will decrease Outcome: Adequate for Discharge

## 2020-10-26 NOTE — Discharge Summary (Signed)
Physician Discharge Summary  Patient ID: [name] MRN: [id number] DOB/AGE: 68-May-1952 68 y.o.  PCP: Laurey Morale, MD  Admit date: 10/25/2020 Discharge date: 10/26/2020  Admission Diagnoses:  prior ostomy site hernia  Discharge Diagnoses:  same  Principal Problem:   Incisional hernia Active Problems:   HTN (hypertension)   Irritable bowel syndrome with both constipation and diarrhea   Obesity (BMI 30-39.9)   Sciatic neuralgia   Chronic neck pain   Chronic narcotic dependence (HCC)   History of colonic diverticulitis   Incarcerated incisional hernia   Ventral hernia   Surgery:  lap repair of incisional hernia  Discharged Condition: improved  Hospital Course:   had surgery by Dr. Johney Maine on Friday and did well overnight.  Ready for discharge on Saturday morning.   Consults: none  Significant Diagnostic Studies: none    Discharge Exam: Blood pressure (!) 105/54, pulse 76, temperature 98.7 F (37.1 C), temperature source Oral, resp. rate 18, height 5\' 4"  (1.626 m), weight 88.8 kg, SpO2 95 %. Incisions covered with tagaderm.  Disposition: Discharge disposition: 01-Home or Self Care       Discharge Instructions     Call MD for:   Complete by: As directed    FEVER > 101.5 F  (temperatures < 101.5 F are not significant)   Call MD for:  extreme fatigue   Complete by: As directed    Call MD for:  persistant dizziness or light-headedness   Complete by: As directed    Call MD for:  persistant nausea and vomiting   Complete by: As directed    Call MD for:  redness, tenderness, or signs of infection (pain, swelling, redness, odor or green/yellow discharge around incision site)   Complete by: As directed    Call MD for:  redness, tenderness, or signs of infection (pain, swelling, redness, odor or green/yellow discharge around incision site)   Complete by: As directed    Call MD for:  severe uncontrolled pain   Complete by: As directed    Diet - low sodium heart  healthy   Complete by: As directed    Start with a bland diet such as soups, liquids, starchy foods, low fat foods, etc. the first few days at home. Gradually advance to a solid, low-fat, high fiber diet by the end of the first week at home.   Add a fiber supplement to your diet (Metamucil, etc) If you feel full, bloated, or constipated, stay on a full liquid or pureed/blenderized diet for a few days until you feel better and are no longer constipated.   Diet - low sodium heart healthy   Complete by: As directed    Discharge instructions   Complete by: As directed    See Discharge Instructions If you are not getting better after two weeks or are noticing you are getting worse, contact our office [phone] for further advice.  We may need to adjust your medications, re-evaluate you in the office, send you to the emergency room, or see what other things we can do to help. The clinic staff is available to answer your questions during regular business hours (8:30am-5pm).  Please don't hesitate to call and ask to speak to one of our nurses for clinical concerns.    A surgeon from [facility] is always on call at the hospitals 24 hours/Dodgen If you have a medical emergency, go to the nearest emergency room or call 911.   Discharge wound care:  Complete by: As directed    It is good for closed incisions and even open wounds to be washed every Gaona.  Shower every Mcreynolds.  Short baths are fine.  Wash the incisions and wounds clean with soap & water.    You may leave closed incisions open to air if it is dry.   You may cover the incision with clean gauze & replace it after your daily shower for comfort.  STERISTRIPS:  You have skin tapes called Steristrips on your incisions.  Leave them in place, and they will fall off on their own like a scab in 1-3 weeks.  You may trim any edges that curl up with clean scissors.  You may remove them in the shower in a week.    TEGADERM:  You have clear  gauze band-aid dressings over your closed incision(s).  Remove the dressings 3 days after surgery.   Driving Restrictions   Complete by: As directed    You may drive when: - you are no longer taking narcotic prescription pain medication - you can comfortably wear a seatbelt - you can safely make sudden turns/stops without pain.   Increase activity slowly   Complete by: As directed    Start light daily activities --- self-care, walking, climbing stairs- beginning the Ahles after surgery.  Gradually increase activities as tolerated.  Control your pain to be active.  Stop when you are tired.  Ideally, walk several times a Bellotti, eventually an hour a Schuler.   Most people are back to most Maready-to-Bahner activities in a few weeks.  It takes 4-6 weeks to get back to unrestricted, intense activity. If you can walk 30 minutes without difficulty, it is safe to try more intense activity such as jogging, treadmill, bicycling, low-impact aerobics, swimming, etc. Save the most intensive and strenuous activity for last (Usually 4-8 weeks after surgery) such as sit-ups, heavy lifting, contact sports, etc.  Refrain from any intense heavy lifting or straining until you are off narcotics for pain control.  You will have off days, but things should improve week-by-week. DO NOT PUSH THROUGH PAIN.  Let pain be your guide: If it hurts to do something, don't do it.   Increase activity slowly   Complete by: As directed    Lifting restrictions   Complete by: As directed    If you can walk 30 minutes without difficulty, it is safe to try more intense activity such as jogging, treadmill, bicycling, low-impact aerobics, swimming, etc. Save the most intensive and strenuous activity for last (Usually 4-8 weeks after surgery) such as sit-ups, heavy lifting, contact sports, etc.   Refrain from any intense heavy lifting or straining until you are off narcotics for pain control.  You will have off days, but things should improve  week-by-week. DO NOT PUSH THROUGH PAIN.  Let pain be your guide: If it hurts to do something, don't do it.  Pain is your body warning you to avoid that activity for another week until the pain goes down.   May shower / Bathe   Complete by: As directed    May walk up steps   Complete by: As directed    Remove dressing in 72 hours   Complete by: As directed    Make sure all dressings are removed by the third Titsworth after surgery.  Leave incisions open to air.  OK to cover incisions with gauze or bandages as desired   Sexual Activity Restrictions   Complete by: As directed  You may have sexual intercourse when it is comfortable. If it hurts to do something, stop.      Allergies as of 10/26/2020       Reactions   Lisinopril Anaphylaxis, Swelling   Tongue swelling   Penicillins Anaphylaxis, Hives   Reaction: teen years   Egg [eggs Or Egg-derived Products] Nausea Only   Other    Strawberry-hives   Adhesive [tape] Rash        Medication List     TAKE these medications    cyclobenzaprine 10 MG tablet Commonly known as: FLEXERIL Take 1 tablet (10 mg total) by mouth 3 (three) times daily as needed for muscle spasms.   gabapentin 300 MG capsule Commonly known as: NEURONTIN Take 1 capsule (300 mg total) by mouth 2 (two) times daily. Increase to 4x/Holan as needed   hydrochlorothiazide 25 MG tablet Commonly known as: HYDRODIURIL Take 1 tablet (25 mg total) by mouth daily.   HYDROcodone-acetaminophen 10-325 MG tablet Commonly known as: NORCO Take 1 tablet by mouth every 8 (eight) hours as needed for moderate pain.   metoprolol succinate 100 MG 24 hr tablet Commonly known as: TOPROL-XL Take 1 tablet (100 mg total) by mouth in the morning and at bedtime.   multivitamin with minerals tablet Take 1 tablet by mouth daily.   tolterodine 4 MG 24 hr capsule Commonly known as: Detrol LA Take 1 capsule (4 mg total) by mouth daily.   VITAMIN C PO Take 1,000 mg by mouth daily. Ester  C               Discharge Care Instructions  (From admission, onward)           Start     Ordered   10/25/20 0000  Discharge wound care:       Comments: It is good for closed incisions and even open wounds to be washed every Lichter.  Shower every Stengel.  Short baths are fine.  Wash the incisions and wounds clean with soap & water.    You may leave closed incisions open to air if it is dry.   You may cover the incision with clean gauze & replace it after your daily shower for comfort.  STERISTRIPS:  You have skin tapes called Steristrips on your incisions.  Leave them in place, and they will fall off on their own like a scab in 1-3 weeks.  You may trim any edges that curl up with clean scissors.  You may remove them in the shower in a week.    TEGADERM:  You have clear gauze band-aid dressings over your closed incision(s).  Remove the dressings 3 days after surgery.   10/25/20 1540            Follow-up Information     Michael Boston, MD Follow up in 3 week(s).   Specialties: General Surgery, Colon and Rectal Surgery Why: To follow up after your operation Contact information: [place] [address] [phone]                 Signed: Pedro Earls 10/26/2020, 8:32 AM

## 2020-10-28 ENCOUNTER — Encounter (HOSPITAL_COMMUNITY): Payer: Self-pay | Admitting: Surgery

## 2020-10-28 NOTE — Addendum Note (Signed)
Addendum  created 10/28/20 1246 by Wilburn Cornelia, CRNA   Intraprocedure Event edited

## 2020-10-29 NOTE — Addendum Note (Signed)
Addendum  created 10/29/20 1410 by Audry Pili, MD   Attestation recorded in [place], [place] filed

## 2020-11-01 ENCOUNTER — Encounter (HOSPITAL_COMMUNITY): Payer: Self-pay | Admitting: Surgery

## 2020-11-04 ENCOUNTER — Encounter (HOSPITAL_COMMUNITY): Payer: Self-pay | Admitting: Surgery

## 2020-11-04 NOTE — OR Nursing (Signed)
ITM number was manually entered for the [employer] management system for implantable devices.  Late entry by Audree Bane, RN.

## 2020-11-13 ENCOUNTER — Other Ambulatory Visit: Payer: Self-pay

## 2020-11-14 ENCOUNTER — Encounter: Payer: Self-pay | Admitting: Family Medicine

## 2020-11-14 ENCOUNTER — Ambulatory Visit (INDEPENDENT_AMBULATORY_CARE_PROVIDER_SITE_OTHER): Payer: Medicare HMO | Admitting: Family Medicine

## 2020-11-14 VITALS — BP 124/70 | HR 72 | Temp 98.1°F | Wt 197.0 lb

## 2020-11-14 DIAGNOSIS — H6123 Impacted cerumen, bilateral: Secondary | ICD-10-CM | POA: Diagnosis not present

## 2020-11-14 NOTE — Progress Notes (Signed)
   Subjective:    Patient ID: [name], female    DOB: Jan 14, 1951, 70 y.o.   MRN: [id number]  HPI Here for muffled sounds in both ears. This started a week ago. There is no pain.    Review of Systems  Constitutional: Negative.   HENT:  Positive for hearing loss. Negative for congestion, ear discharge, ear pain, postnasal drip, sinus pressure and sore throat.   Eyes: Negative.   Respiratory: Negative.        Objective:   Physical Exam Constitutional:      General: She is not in acute distress.    Appearance: Normal appearance.  HENT:     Right Ear: There is impacted cerumen.     Left Ear: There is impacted cerumen.     Nose: Nose normal.     Mouth/Throat:     Pharynx: Oropharynx is clear.  Eyes:     Conjunctiva/sclera: Conjunctivae normal.  Cardiovascular:     Rate and Rhythm: Normal rate and regular rhythm.     Pulses: Normal pulses.     Heart sounds: Normal heart sounds.  Pulmonary:     Effort: Pulmonary effort is normal.     Breath sounds: Normal breath sounds.  Musculoskeletal:     Cervical back: Neck supple.  Neurological:     Mental Status: She is alert.          Assessment & Plan:  Cerumen impactions. We obtained informed consent to irrigate both ear canals with water. This was done successfully and she tolerated the procedure well. Afterwards she could hear normally. We spent 35 minutes performing this procedure.  Alysia Penna, MD

## 2020-11-21 ENCOUNTER — Other Ambulatory Visit: Payer: Medicare HMO

## 2020-11-22 ENCOUNTER — Other Ambulatory Visit: Payer: Self-pay | Admitting: Family Medicine

## 2020-11-22 ENCOUNTER — Encounter: Payer: Self-pay | Admitting: Family Medicine

## 2020-11-22 ENCOUNTER — Other Ambulatory Visit: Payer: Medicare HMO

## 2020-11-22 ENCOUNTER — Telehealth (INDEPENDENT_AMBULATORY_CARE_PROVIDER_SITE_OTHER): Payer: Medicare HMO | Admitting: Family Medicine

## 2020-11-22 VITALS — Wt 198.0 lb

## 2020-11-22 DIAGNOSIS — E2839 Other primary ovarian failure: Secondary | ICD-10-CM

## 2020-11-22 DIAGNOSIS — U071 COVID-19: Secondary | ICD-10-CM | POA: Diagnosis not present

## 2020-11-22 MED ORDER — NIRMATRELVIR/RITONAVIR (PAXLOVID)TABLET: 3.0000 | ORAL_TABLET | Freq: Two times a day (BID) | ORAL | 0 refills | Status: AC

## 2020-11-22 NOTE — Progress Notes (Signed)
Subjective:    Patient ID: [name], female    DOB: Mar 21, 1951, 70 y.o.   MRN: [id number]  HPI Virtual Visit via Video Note  I connected with the patient on 11/22/20 at  4:15 PM EDT by a video enabled telemedicine application and verified that I am speaking with the correct person using two identifiers.  Location patient: home Location provider:work or home office Persons participating in the virtual visit: patient, provider  I discussed the limitations of evaluation and management by telemedicine and the availability of in person appointments. The patient expressed understanding and agreed to proceed.   HPI: Here for a Covid-19 infection. 3 days ago she developed a fever, sneezing ,and a dry cough. No chest pain or SOB. No body aches or NVD. Drinking fluids. She tested positive for the Covid virus this morning.    ROS: See pertinent positives and negatives per HPI.  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/20/2020   Dyspnea    History of colostomy reversal 12/07/2019   Hypertension    Large bowel obstruction ([place]) 04/13/2019   Neuromuscular disorder ([place])    nerve pain in neck   Neuropathy    Urinary incontinence    Wears contact lenses    Wears dentures     Past Surgical History:  Procedure Laterality Date   ABDOMINAL HYSTERECTOMY     oophorectomy   APPENDECTOMY     CERVICAL FUSION  2009   and plating   COLECTOMY WITH COLOSTOMY CREATION/HARTMANN PROCEDURE N/A 04/14/2019   Procedure: SIGMOID COLECTOMY, RESECTION OF SMALL BOWEL,WITH END COLOSTOMY, PROCTOSCOPY RIGID;  Surgeon: Alphonsa Overall, MD;  Location: [facility];  Service: General;  Laterality: N/A;   COLONOSCOPY  08/29/2019   per Dr. Hilarie Fredrickson, diverticulosis and one benign polyp, repeat in 10  yrs    COLOSTOMY TAKEDOWN N/A 12/07/2019   Procedure: LAPAROSCOPIC COLOSTOMY REVERSAL;  Surgeon:  Alphonsa Overall, MD;  Location: [facility];  Service: General;  Laterality: N/A;   CYSTOSCOPY WITH STENT PLACEMENT Bilateral 04/14/2019   Procedure: CYSTOSCOPY WITH BILATERAL STENT PLACEMENT;  Surgeon: Robley Fries, MD;  Location: [facility];  Service: Urology;  Laterality: Bilateral;   DILATION AND CURETTAGE OF UTERUS     EAR CYST EXCISION Right 07/05/2014   Procedure: EXCISION FLEXOR SHEATH CYST WITH RELEASE A-1 PULLEY RIGHT SMALL FINGER;  Surgeon: Leanora Cover, MD;  Location: [facility];  Service: Orthopedics;  Laterality: Right;   EYE SURGERY Bilateral    cataract surgery   INSERTION OF MESH N/A 10/25/2020   Procedure: INSERTION OF MESH;  Surgeon: Michael Boston, MD;  Location: [facility];  Service: General;  Laterality: N/A;   LYSIS OF ADHESION N/A 10/25/2020   Procedure: LYSIS OF ADHESION;  Surgeon: Michael Boston, MD;  Location: [place];  Service: General;  Laterality: N/A;   SPINE SURGERY     STOMACH SURGERY     TRIGGER FINGER RELEASE Right 07/05/2014   Procedure: RELEASE RIGHT SMALL FINGER/A-1 PULLEY;  Surgeon: Leanora Cover, MD;  Location: [place];  Service: Orthopedics;  Laterality: Right;   TUBOPLASTY / TUBOTUBAL ANASTOMOSIS     VENTRAL HERNIA REPAIR N/A 10/25/2020   Procedure: LAPAROSCOPIC VENTRAL WALL HERNIA REPAIR;  Surgeon: Michael Boston, MD;  Location: [facility];  Service: General;  Laterality: N/A;    Family History  Problem Relation Age of Onset  Arthritis Other    Breast cancer Other    Hypertension Other    Breast cancer Mother    Colon cancer Neg Hx    Colon polyps Neg Hx    Esophageal cancer Neg Hx    Stomach cancer Neg Hx    Rectal cancer Neg Hx      Current Outpatient Medications:    Ascorbic Acid (VITAMIN C PO), Take 1,000 mg by mouth daily. Ester C, Disp: , Rfl:    cyclobenzaprine (FLEXERIL) 10 MG tablet, Take 1 tablet (10 mg total) by mouth 3 (three) times daily as needed for muscle spasms., Disp: 90 tablet, Rfl: 2   gabapentin (NEURONTIN)  300 MG capsule, Take 1 capsule (300 mg total) by mouth 2 (two) times daily. Increase to 4x/[place] as needed, Disp: 60 capsule, Rfl: 1   hydrochlorothiazide (HYDRODIURIL) 25 MG tablet, Take 1 tablet (25 mg total) by mouth daily., Disp: 90 tablet, Rfl: 3   HYDROcodone-acetaminophen (NORCO) 10-325 MG tablet, Take 1 tablet by mouth every 8 (eight) hours as needed for moderate pain., Disp: 90 tablet, Rfl: 0   metoprolol succinate (TOPROL-XL) 100 MG 24 hr tablet, Take 1 tablet (100 mg total) by mouth in the morning and at bedtime., Disp: 180 tablet, Rfl: 3   Multiple Vitamins-Minerals (MULTIVITAMIN WITH MINERALS) tablet, Take 1 tablet by mouth daily., Disp: , Rfl:    nirmatrelvir/ritonavir EUA (PAXLOVID) TABS, Take 3 tablets by mouth 2 (two) times daily for 5 days. (Take nirmatrelvir 150 mg two tablets twice daily for 5 days and ritonavir 100 mg one tablet twice daily for 5 days) Patient GFR is 84, Disp: 30 tablet, Rfl: 0   tolterodine (DETROL LA) 4 MG 24 hr capsule, Take 1 capsule (4 mg total) by mouth daily., Disp: 30 capsule, Rfl: 5  EXAM:  VITALS per patient if applicable:  GENERAL: alert, oriented, appears well and in no acute distress  HEENT: atraumatic, conjunttiva clear, no obvious abnormalities on inspection of external nose and ears  NECK: normal movements of the head and neck  LUNGS: on inspection no signs of respiratory distress, breathing rate appears normal, no obvious gross SOB, gasping or wheezing  CV: no obvious cyanosis  MS: moves all visible extremities without noticeable abnormality  PSYCH/NEURO: pleasant and cooperative, no obvious depression or anxiety, speech and thought processing grossly intact  ASSESSMENT AND PLAN: Covid-19 infection. Treat with 5 days of Paxlovid. She will quarantine for 5 days. Alysia Penna, MD  Discussed the following assessment and plan:  No diagnosis found.     I discussed the assessment and treatment plan with the patient. The patient was  provided an opportunity to ask questions and all were answered. The patient agreed with the plan and demonstrated an understanding of the instructions.   The patient was advised to call back or seek an in-person evaluation if the symptoms worsen or if the condition fails to improve as anticipated.      Review of Systems     Objective:   Physical Exam        Assessment & Plan:

## 2020-11-22 NOTE — Telephone Encounter (Signed)
Set up a virtual OV for today

## 2020-12-03 DIAGNOSIS — N3281 Overactive bladder: Secondary | ICD-10-CM | POA: Diagnosis not present

## 2020-12-03 DIAGNOSIS — Z933 Colostomy status: Secondary | ICD-10-CM | POA: Diagnosis not present

## 2020-12-03 DIAGNOSIS — I10 Essential (primary) hypertension: Secondary | ICD-10-CM | POA: Diagnosis not present

## 2020-12-03 DIAGNOSIS — Z8249 Family history of ischemic heart disease and other diseases of the circulatory system: Secondary | ICD-10-CM | POA: Diagnosis not present

## 2020-12-03 DIAGNOSIS — Z833 Family history of diabetes mellitus: Secondary | ICD-10-CM | POA: Diagnosis not present

## 2020-12-17 ENCOUNTER — Other Ambulatory Visit: Payer: Self-pay

## 2020-12-18 ENCOUNTER — Encounter: Payer: Self-pay | Admitting: Family Medicine

## 2020-12-18 ENCOUNTER — Ambulatory Visit (INDEPENDENT_AMBULATORY_CARE_PROVIDER_SITE_OTHER): Payer: Medicare HMO | Admitting: Family Medicine

## 2020-12-18 VITALS — BP 126/78 | HR 78 | Temp 99.1°F | Wt 192.0 lb

## 2020-12-18 DIAGNOSIS — F1190 Opioid use, unspecified, uncomplicated: Secondary | ICD-10-CM | POA: Diagnosis not present

## 2020-12-18 DIAGNOSIS — R69 Illness, unspecified: Secondary | ICD-10-CM | POA: Diagnosis not present

## 2020-12-18 DIAGNOSIS — M5030 Other cervical disc degeneration, unspecified cervical region: Secondary | ICD-10-CM

## 2020-12-18 MED ORDER — HYDROCODONE-ACETAMINOPHEN 10-325 MG PO TABS: 1.0000 | ORAL_TABLET | Freq: Four times a day (QID) | ORAL | 0 refills | Status: DC | PRN

## 2020-12-18 MED ORDER — HYDROCODONE-ACETAMINOPHEN 10-325 MG PO TABS: 1.0000 | ORAL_TABLET | Freq: Four times a day (QID) | ORAL | 0 refills | Status: AC | PRN

## 2020-12-18 NOTE — Progress Notes (Signed)
   Subjective:    Patient ID: [name], female    DOB: Jan 18, 1951, 70 y.o.   MRN: [id number]  HPI Here for pain management. Her neck pain is about the same, but sometimes she has to take extra pain medication and the she runs short.    Review of Systems  Constitutional: Negative.   Musculoskeletal:  Positive for neck pain.      Objective:   Physical Exam Constitutional:      Appearance: Normal appearance.  Neurological:     Mental Status: She is alert.          Assessment & Plan:  Pain management. Indication for chronic opioid: neck pain Medication and dose: Norco 10-325  # pills per month: 90 Last UDS date: 09-11-20 Opioid Treatment Agreement signed (Y/N): 06-03-18 Opioid Treatment Agreement last reviewed with patient:  12-18-20 [facility] reviewed this encounter (include red flags): Yes We agreed to increase the Norco to 120 pills a month.  Alysia Penna, MD

## 2020-12-26 ENCOUNTER — Telehealth: Payer: Self-pay | Admitting: Family Medicine

## 2020-12-26 NOTE — Telephone Encounter (Signed)
Left message for patient to call back and schedule Medicare Annual Wellness Visit (AWV) either virtually or in office. Left  my Herbie Drape number [phone]   Last AWV I 02/06/20 ; please schedule at anytime with [facility] Nurse Health Advisor 1 or 2   This should be a 45 minute visit.

## 2021-03-06 ENCOUNTER — Telehealth: Payer: Self-pay | Admitting: Family Medicine

## 2021-03-06 DIAGNOSIS — M25561 Pain in right knee: Secondary | ICD-10-CM | POA: Diagnosis not present

## 2021-03-06 NOTE — Telephone Encounter (Signed)
Left message for patient to call back and schedule Medicare Annual Wellness Visit (AWV) either virtually or in office. Left  my [name] number [phone]   Last AWV I 02/06/20  please schedule at anytime with [facility] Nurse Health Advisor 1 or 2   This should be a 45 minute visit.

## 2021-03-11 ENCOUNTER — Ambulatory Visit: Payer: Medicare HMO

## 2021-03-14 ENCOUNTER — Ambulatory Visit (INDEPENDENT_AMBULATORY_CARE_PROVIDER_SITE_OTHER): Payer: Medicare HMO

## 2021-03-14 DIAGNOSIS — Z0000 Encounter for general adult medical examination without abnormal findings: Secondary | ICD-10-CM

## 2021-03-14 DIAGNOSIS — Z1231 Encounter for screening mammogram for malignant neoplasm of breast: Secondary | ICD-10-CM

## 2021-03-14 NOTE — Patient Instructions (Signed)
[name] , Thank you for taking time to come for your Medicare Wellness Visit. I appreciate your ongoing commitment to your health goals. Please review the following plan we discussed and let me know if I can assist you in the future.   Screening recommendations/referrals: Colonoscopy: 08/29/19  due 2031 Mammogram: 04/02/2020 getting scheduled referral submitted  Bone Density: 05/08/21  scheduled  Recommended yearly ophthalmology/optometry visit for glaucoma screening and checkup Recommended yearly dental visit for hygiene and checkup  Vaccinations: Influenza vaccine: will obtain egg free vaccine  Pneumococcal vaccine: completed  Tdap vaccine: 07/24/2014 Shingles vaccine: will consider     Advanced directives: none   Conditions/risks identified: none   Next appointment: 03/20/2021  0945  Dr. Sarajane Jews   Preventive Care 23 Years and Older, Female Preventive care refers to lifestyle choices and visits with your health care provider that can promote health and wellness. What does preventive care include? A yearly physical exam. This is also called an annual well check. Dental exams once or twice a year. Routine eye exams. Ask your health care provider how often you should have your eyes checked. Personal lifestyle choices, including: Daily care of your teeth and gums. Regular physical activity. Eating a healthy diet. Avoiding tobacco and drug use. Limiting alcohol use. Practicing safe sex. Taking low-dose aspirin every [place]. Taking vitamin and mineral supplements as recommended by your health care provider. What happens during an annual well check? The services and screenings done by your health care provider during your annual well check will depend on your age, overall health, lifestyle risk factors, and family history of disease. Counseling  Your health care provider may ask you questions about your: Alcohol use. Tobacco use. Drug use. Emotional well-being. Home and relationship  well-being. Sexual activity. Eating habits. History of falls. Memory and ability to understand (cognition). Work and work Statistician. Reproductive health. Screening  You may have the following tests or measurements: Height, weight, and BMI. Blood pressure. Lipid and cholesterol levels. These may be checked every 5 years, or more frequently if you are over 85 years old. Skin check. Lung cancer screening. You may have this screening every year starting at age 20 if you have a 30-pack-year history of smoking and currently smoke or have quit within the past 15 years. Fecal occult blood test (FOBT) of the stool. You may have this test every year starting at age 69. Flexible sigmoidoscopy or colonoscopy. You may have a sigmoidoscopy every 5 years or a colonoscopy every 10 years starting at age 49. Hepatitis C blood test. Hepatitis B blood test. Sexually transmitted disease (STD) testing. Diabetes screening. This is done by checking your blood sugar (glucose) after you have not eaten for a while (fasting). You may have this done every 1-3 years. Bone density scan. This is done to screen for osteoporosis. You may have this done starting at age 61. Mammogram. This may be done every 1-2 years. Talk to your health care provider about how often you should have regular mammograms. Talk with your health care provider about your test results, treatment options, and if necessary, the need for more tests. Vaccines  Your health care provider may recommend certain vaccines, such as: Influenza vaccine. This is recommended every year. Tetanus, diphtheria, and acellular pertussis (Tdap, Td) vaccine. You may need a Td booster every 10 years. Zoster vaccine. You may need this after age 26. Pneumococcal 13-valent conjugate (PCV13) vaccine. One dose is recommended after age 12. Pneumococcal polysaccharide (PPSV23) vaccine. One dose is recommended  after age 33. Talk to your health care provider about which  screenings and vaccines you need and how often you need them. This information is not intended to replace advice given to you by your health care provider. Make sure you discuss any questions you have with your health care provider. Document Released: 05/24/2015 Document Revised: 01/15/2016 Document Reviewed: 02/26/2015 Elsevier Interactive Patient Education  2017 [employer] Prevention in the Home Falls can cause injuries. They can happen to people of all ages. There are many things you can do to make your home safe and to help prevent falls. What can I do on the outside of my home? Regularly fix the edges of walkways and driveways and fix any cracks. Remove anything that might make you trip as you walk through a door, such as a raised step or threshold. Trim any bushes or trees on the path to your home. Use bright outdoor lighting. Clear any walking paths of anything that might make someone trip, such as rocks or tools. Regularly check to see if handrails are loose or broken. Make sure that both sides of any steps have handrails. Any raised decks and porches should have guardrails on the edges. Have any leaves, snow, or ice cleared regularly. Use sand or salt on walking paths during winter. Clean up any spills in your garage right away. This includes oil or grease spills. What can I do in the bathroom? Use night lights. Install grab bars by the toilet and in the tub and shower. Do not use towel bars as grab bars. Use non-skid mats or decals in the tub or shower. If you need to sit down in the shower, use a plastic, non-slip stool. Keep the floor dry. Clean up any water that spills on the floor as soon as it happens. Remove soap buildup in the tub or shower regularly. Attach bath mats securely with double-sided non-slip rug tape. Do not have throw rugs and other things on the floor that can make you trip. What can I do in the bedroom? Use night lights. Make sure that you have a  light by your bed that is easy to reach. Do not use any sheets or blankets that are too big for your bed. They should not hang down onto the floor. Have a firm chair that has side arms. You can use this for support while you get dressed. Do not have throw rugs and other things on the floor that can make you trip. What can I do in the kitchen? Clean up any spills right away. Avoid walking on wet floors. Keep items that you use a lot in easy-to-reach places. If you need to reach something above you, use a strong step stool that has a grab bar. Keep electrical cords out of the way. Do not use floor polish or wax that makes floors slippery. If you must use wax, use non-skid floor wax. Do not have throw rugs and other things on the floor that can make you trip. What can I do with my stairs? Do not leave any items on the stairs. Make sure that there are handrails on both sides of the stairs and use them. Fix handrails that are broken or loose. Make sure that handrails are as long as the stairways. Check any carpeting to make sure that it is firmly attached to the stairs. Fix any carpet that is loose or worn. Avoid having throw rugs at the top or bottom of the stairs. If you  do have throw rugs, attach them to the floor with carpet tape. Make sure that you have a light switch at the top of the stairs and the bottom of the stairs. If you do not have them, ask someone to add them for you. What else can I do to help prevent falls? Wear shoes that: Do not have high heels. Have rubber bottoms. Are comfortable and fit you well. Are closed at the toe. Do not wear sandals. If you use a stepladder: Make sure that it is fully opened. Do not climb a closed stepladder. Make sure that both sides of the stepladder are locked into place. Ask someone to hold it for you, if possible. Clearly mark and make sure that you can see: Any grab bars or handrails. First and last steps. Where the edge of each step  is. Use tools that help you move around (mobility aids) if they are needed. These include: Canes. Walkers. Scooters. Crutches. Turn on the lights when you go into a dark area. Replace any light bulbs as soon as they burn out. Set up your furniture so you have a clear path. Avoid moving your furniture around. If any of your floors are uneven, fix them. If there are any pets around you, be aware of where they are. Review your medicines with your doctor. Some medicines can make you feel dizzy. This can increase your chance of falling. Ask your doctor what other things that you can do to help prevent falls. This information is not intended to replace advice given to you by your health care provider. Make sure you discuss any questions you have with your health care provider. Document Released: 02/21/2009 Document Revised: 10/03/2015 Document Reviewed: 06/01/2014 Elsevier Interactive Patient Education  2017 [employer].

## 2021-03-14 NOTE — Progress Notes (Addendum)
Subjective:   [name] is a 70 y.o. female who presents for Medicare Annual Initial  preventive examination.  I connected with [name] today by telephone and verified that I am speaking with the correct person using two identifiers. Location patient: home Location provider: work Persons participating in the virtual visit: patient, provider.   I discussed the limitations, risks, security and privacy concerns of performing an evaluation and management service by telephone and the availability of in person appointments. I also discussed with the patient that there may be a patient responsible charge related to this service. The patient expressed understanding and verbally consented to this telephonic visit.    Interactive audio and video telecommunications were attempted between this provider and patient, however failed, due to patient having technical difficulties OR patient did not have access to video capability.  We continued and completed visit with audio only.    Review of Systems     Cardiac Risk Factors include: advanced age (>65men, >93 women);dyslipidemia     Objective:    Today's Vitals   There is no height or weight on file to calculate BMI.  Advanced Directives 03/14/2021 10/25/2020 10/25/2020 10/21/2020 08/12/2020 02/06/2020 12/07/2019  Does Patient Have a Medical Advance Directive? No - No No Yes No;Yes No  Type of Advance Directive - - - - Press photographer;Living will [place];Living will -  Does patient want to make changes to medical advance directive? No - Patient declined - No - Patient declined - - No - Patient declined -  Copy of [place] in Chart? - - - - - Yes - validated most recent copy scanned in chart (See row information) -  Would patient like information on creating a medical advance directive? - No - Patient declined - Yes (MAU/Ambulatory/Procedural Areas - Information given) - - Yes  (MAU/Ambulatory/Procedural Areas - Information given)    Current Medications (verified) Outpatient Encounter Medications as of 03/14/2021  Medication Sig   Ascorbic Acid (VITAMIN C PO) Take 1,000 mg by mouth daily. Ester C   Biotin 1000 MCG tablet Take 1,500 mcg by mouth 3 (three) times daily.   hydrochlorothiazide (HYDRODIURIL) 25 MG tablet Take 1 tablet (25 mg total) by mouth daily.   [EXPIRED] HYDROcodone-acetaminophen (NORCO) 10-325 MG tablet Take 1 tablet by mouth every 6 (six) hours as needed for moderate pain.   Multiple Vitamins-Minerals (MULTIVITAMIN WITH MINERALS) tablet Take 1 tablet by mouth daily.   tolterodine (DETROL LA) 4 MG 24 hr capsule Take 1 capsule (4 mg total) by mouth daily.   [DISCONTINUED] metoprolol succinate (TOPROL-XL) 100 MG 24 hr tablet Take 1 tablet (100 mg total) by mouth in the morning and at bedtime.   No facility-administered encounter medications on file as of 03/14/2021.    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/20/2020   Dyspnea    History of colostomy reversal 12/07/2019   Hypertension    Large bowel obstruction ([place]) 04/13/2019   Neuromuscular disorder ([place])    nerve pain in neck   Neuropathy    Urinary incontinence    Wears contact lenses    Wears dentures    Past Surgical History:  Procedure Laterality Date   ABDOMINAL HYSTERECTOMY     oophorectomy  APPENDECTOMY     CERVICAL FUSION  2009   and plating   COLECTOMY WITH COLOSTOMY CREATION/HARTMANN PROCEDURE N/A 04/14/2019   Procedure: SIGMOID COLECTOMY, RESECTION OF SMALL BOWEL,WITH END COLOSTOMY, PROCTOSCOPY RIGID;  Surgeon: Alphonsa Overall, MD;  Location: [facility];  Service: General;  Laterality: N/A;   COLONOSCOPY  08/29/2019   per Dr. Hilarie Fredrickson,  diverticulosis and one benign polyp, repeat in 10  yrs    COLOSTOMY TAKEDOWN N/A 12/07/2019   Procedure: LAPAROSCOPIC COLOSTOMY REVERSAL;  Surgeon: Alphonsa Overall, MD;  Location: [facility];  Service: General;  Laterality: N/A;   CYSTOSCOPY WITH STENT PLACEMENT Bilateral 04/14/2019   Procedure: CYSTOSCOPY WITH BILATERAL STENT PLACEMENT;  Surgeon: Robley Fries, MD;  Location: [facility];  Service: Urology;  Laterality: Bilateral;   DILATION AND CURETTAGE OF UTERUS     EAR CYST EXCISION Right 07/05/2014   Procedure: EXCISION FLEXOR SHEATH CYST WITH RELEASE A-1 PULLEY RIGHT SMALL FINGER;  Surgeon: Leanora Cover, MD;  Location: [facility];  Service: Orthopedics;  Laterality: Right;   EYE SURGERY Bilateral    cataract surgery   INSERTION OF MESH N/A 10/25/2020   Procedure: INSERTION OF MESH;  Surgeon: Michael Boston, MD;  Location: [facility];  Service: General;  Laterality: N/A;   LYSIS OF ADHESION N/A 10/25/2020   Procedure: LYSIS OF ADHESION;  Surgeon: Michael Boston, MD;  Location: [facility];  Service: General;  Laterality: N/A;   SPINE SURGERY     STOMACH SURGERY     TRIGGER FINGER RELEASE Right 07/05/2014   Procedure: RELEASE RIGHT SMALL FINGER/A-1 PULLEY;  Surgeon: Leanora Cover, MD;  Location: [facility];  Service: Orthopedics;  Laterality: Right;   TUBOPLASTY / TUBOTUBAL ANASTOMOSIS     VENTRAL HERNIA REPAIR N/A 10/25/2020   Procedure: LAPAROSCOPIC VENTRAL WALL HERNIA REPAIR;  Surgeon: Michael Boston, MD;  Location: [facility];  Service: General;  Laterality: N/A;   Family History  Problem Relation Age of Onset   Arthritis Other    Breast cancer Other    Hypertension Other    Breast cancer Mother    Colon cancer Neg Hx    Colon polyps Neg Hx    Esophageal cancer Neg Hx    Stomach cancer Neg Hx    Rectal cancer Neg Hx    Social History   Socioeconomic History   Marital status: Married    Spouse name: Not on file   Number of children: Not on file   Years of education: Not  on file   Highest education level: 12th grade  Occupational History   Not on file  Tobacco Use   Smoking status: Former    Packs/[place]: 0.50    Types: Cigarettes    Quit date: 06/02/2019    Years since quitting: 1.8   Smokeless tobacco: Never   Tobacco comments:    3 weeks ago  Vaping Use   Vaping Use: Never used  Substance and Sexual Activity   Alcohol use: No    Alcohol/week: 0.0 standard drinks   Drug use: Never   Sexual activity: Not on file  Other Topics Concern   Not on file  Social History Narrative   Not on file   Social Determinants of Health   Financial Resource Strain: Low Risk    Difficulty of Paying Living Expenses: Not very hard  Food Insecurity: No Food Insecurity   Worried About Running Out of Food in the Last Year: Never true   Ran Out of Food in the  Last Year: Never true  Transportation Needs: No Transportation Needs   Lack of Transportation (Medical): No   Lack of Transportation (Non-Medical): No  Physical Activity: Insufficiently Active   Days of Exercise per Week: 3 days   Minutes of Exercise per Session: 30 min  Stress: No Stress Concern Present   Feeling of Stress : Not at all  Social Connections: Socially Integrated   Frequency of Communication with Friends and Family: More than three times a week   Frequency of Social Gatherings with Friends and Family: Three times a week   Attends Religious Services: More than 4 times per year   Active Member of Clubs or Organizations: Yes   Attends Music therapist: More than 4 times per year   Marital Status: Married    Tobacco Counseling Counseling given: Not Answered Tobacco comments: 3 weeks ago   Clinical Intake:  Pre-visit preparation completed: Yes  Pain : No/denies pain     Nutritional Risks: None Diabetes: No  How often do you need to have someone help you when you read instructions, pamphlets, or other written materials from your doctor or pharmacy?: 1 - Never What is the  last grade level you completed in school?: High School  Diabetic?no  Interpreter Needed?: No  Information entered by :: L.Alla Sloma,LPN   Activities of Daily Living In your present state of health, do you have any difficulty performing the following activities: 03/21/2021 03/18/2021  Hearing? N N  Vision? N N  Difficulty concentrating or making decisions? N N  Walking or climbing stairs? N N  Dressing or bathing? N N  Doing errands, shopping? N N  Preparing Food and eating ? N N  Using the Toilet? N N  In the past six months, have you accidently leaked urine? N N  Do you have problems with loss of bowel control? N N  Managing your Medications? N N  Managing your Finances? N N  Housekeeping or managing your Housekeeping? N N  Some recent data might be hidden    Patient Care Team: Laurey Morale, MD as PCP - Huston Foley, MD as Consulting Physician (General Surgery)  Indicate any recent Medical Services you may have received from other than [facility] providers in the past year (date may be approximate).     Assessment:   This is a routine wellness examination for [name].  Hearing/Vision screen Vision Screening - Comments:: Annual eye exams wear glasses/contacts  Dietary issues and exercise activities discussed: Current Exercise Habits: Home exercise routine, Type of exercise: walking, Time (Minutes): 30, Frequency (Times/Week): 3, Weekly Exercise (Minutes/Week): 90, Intensity: Mild, Exercise limited by: None identified   Goals Addressed             This Visit's Progress    Patient Stated   On track    I will continue to walk 2 miles per [place]        Depression Screen PHQ 2/9 Scores 03/14/2021 03/14/2021 03/21/2020 02/06/2020 05/19/2019 11/16/2017  PHQ - 2 Score 0 0 0 0 0 0  PHQ- 9 Score - - - 0 - -    Fall Risk Fall Risk  03/14/2021 03/13/2021 03/13/2021 03/21/2020 02/06/2020  Falls in the past year? 0 0 0 0 0  Number falls in past yr: 0 - - - 0  Injury with Fall? 0  - - - 0  Risk for fall due to : - - - - No Fall Risks  Follow up Falls evaluation completed - - -  Falls evaluation completed;Falls prevention discussed    FALL RISK PREVENTION PERTAINING TO THE HOME:  Any stairs in or around the home? No  If so, are there any without handrails? No  Home free of loose throw rugs in walkways, pet beds, electrical cords, etc? Yes  Adequate lighting in your home to reduce risk of falls? Yes   ASSISTIVE DEVICES UTILIZED TO PREVENT FALLS:  Life alert? No  Use of a cane, walker or w/c? No  Grab bars in the bathroom? No  Shower chair or bench in shower? No  Elevated toilet seat or a handicapped toilet? No    Cognitive Function:    Normal cognitive status assessed by direct observation by this Nurse Health Advisor. No abnormalities found.      Immunizations Immunization History  Administered Date(s) Administered   Influenza Inj Mdck Quad Pf 03/20/2021   Influenza-Unspecified 01/21/2017   PFIZER(Purple Top)SARS-COV-2 Vaccination 06/23/2019, 07/15/2019, 02/09/2020, 09/14/2020   Pneumococcal Conjugate-13 07/24/2014   Pneumococcal Polysaccharide-23 11/16/2017   Tdap 07/24/2014   Zoster, Live 08/15/2014    TDAP status: Up to date  Flu Vaccine status: Due, Education has been provided regarding the importance of this vaccine. Advised may receive this vaccine at local pharmacy or Health Dept. Aware to provide a copy of the vaccination record if obtained from local pharmacy or Health Dept. Verbalized acceptance and understanding.  Pneumococcal vaccine status: Up to date  Covid-19 vaccine status: Completed vaccines  Qualifies for Shingles Vaccine? Yes   Zostavax completed No   Shingrix Completed?: No.    Education has been provided regarding the importance of this vaccine. Patient has been advised to call insurance company to determine out of pocket expense if they have not yet received this vaccine. Advised may also receive vaccine at local pharmacy or  Health Dept. Verbalized acceptance and understanding.  Screening Tests Health Maintenance  Topic Date Due   Zoster Vaccines- Shingrix (1 of 2) Never done   DEXA SCAN  Never done   COVID-19 Vaccine (5 - Booster for Pfizer series) 11/09/2020   MAMMOGRAM  04/02/2021   TETANUS/TDAP  07/23/2024   COLONOSCOPY (Pts 45-2yrs Insurance coverage will need to be confirmed)  08/28/2029   Pneumonia Vaccine 68+ Years old  Completed   INFLUENZA VACCINE  Completed   Hepatitis C Screening  Completed   HPV VACCINES  Aged Out    Health Maintenance  Health Maintenance Due  Topic Date Due   Zoster Vaccines- Shingrix (1 of 2) Never done   DEXA SCAN  Never done   COVID-19 Vaccine (5 - Booster for Pfizer series) 11/09/2020    Colorectal cancer screening: Type of screening: Colonoscopy. Completed 08/29/2019. Repeat every 10 years  Mammogram status: Completed 04/02/2020. Repeat every year  Bone Density status: Completed 05/08/2021. Results reflect: Bone density results: OSTEOPENIA. Repeat every 5 years.  Lung Cancer Screening: (Low Dose CT Chest recommended if Age 55-80 years, 30 pack-year currently smoking OR have quit w/in 15years.) does not qualify.   Lung Cancer Screening Referral: n/a  Additional Screening:  Hepatitis C Screening: does not qualify; Completed 08/04/2016  Vision Screening: Recommended annual ophthalmology exams for early detection of glaucoma and other disorders of the eye. Is the patient up to date with their annual eye exam?  Yes  Who is the provider or what is the name of the office in which the patient attends annual eye exams? Dr.McQuen  If pt is not established with a provider, would they like to be referred to a  provider to establish care? Yes .   Dental Screening: Recommended annual dental exams for proper oral hygiene  Community Resource Referral / Chronic Care Management: CRR required this visit?  No   CCM required this visit?  No      Plan:     I have  personally reviewed and noted the following in the patient's chart:   Medical and social history Use of alcohol, tobacco or illicit drugs  Current medications and supplements including opioid prescriptions.  Functional ability and status Nutritional status Physical activity Advanced directives List of other physicians Hospitalizations, surgeries, and ER visits in previous 12 months Vitals Screenings to include cognitive, depression, and falls Referrals and appointments  In addition, I have reviewed and discussed with patient certain preventive protocols, quality metrics, and best practice recommendations. A written personalized care plan for preventive services as well as general preventive health recommendations were provided to patient.     Randel Pigg, LPN   97/28/2060   Nurse Notes: none

## 2021-03-17 ENCOUNTER — Ambulatory Visit: Payer: Medicare HMO | Admitting: Family Medicine

## 2021-03-18 ENCOUNTER — Other Ambulatory Visit: Payer: Self-pay | Admitting: Family Medicine

## 2021-03-20 ENCOUNTER — Ambulatory Visit (INDEPENDENT_AMBULATORY_CARE_PROVIDER_SITE_OTHER): Payer: Medicare HMO | Admitting: Family Medicine

## 2021-03-20 ENCOUNTER — Encounter: Payer: Self-pay | Admitting: Family Medicine

## 2021-03-20 VITALS — BP 110/70 | HR 73 | Temp 98.3°F | Wt 194.0 lb

## 2021-03-20 DIAGNOSIS — R69 Illness, unspecified: Secondary | ICD-10-CM | POA: Diagnosis not present

## 2021-03-20 DIAGNOSIS — M542 Cervicalgia: Secondary | ICD-10-CM | POA: Diagnosis not present

## 2021-03-20 DIAGNOSIS — G8929 Other chronic pain: Secondary | ICD-10-CM | POA: Diagnosis not present

## 2021-03-20 DIAGNOSIS — Z23 Encounter for immunization: Secondary | ICD-10-CM | POA: Diagnosis not present

## 2021-03-20 DIAGNOSIS — F1190 Opioid use, unspecified, uncomplicated: Secondary | ICD-10-CM

## 2021-03-20 MED ORDER — HYDROCODONE-ACETAMINOPHEN 10-325 MG PO TABS: 1.0000 | ORAL_TABLET | Freq: Three times a day (TID) | ORAL | 0 refills | Status: DC | PRN

## 2021-03-20 NOTE — Addendum Note (Signed)
Addended by: Wyvonne Lenz on: 03/20/2021 11:03 AM   Modules accepted: Orders

## 2021-03-20 NOTE — Progress Notes (Signed)
   Subjective:    Patient ID: [name], female    DOB: [birth year], 70 y.o.   MRN: [id number]  HPI Here for pain management , she is doing well.    Review of Systems     Objective:   Physical Exam        Assessment & Plan:  Pain management.  Indication for chronic opioid: neck pain Medication and dose: Norco 10-325 # pills per month: 90 Last UDS date: 09-11-20 Opioid Treatment Agreement signed (Y/N): 06-03-18 Opioid Treatment Agreement last reviewed with patient:  03-20-21 [facility] reviewed this encounter (include red flags): Yes Meds were refilled. Alysia Penna, MD

## 2021-03-28 DIAGNOSIS — S83241A Other tear of medial meniscus, current injury, right knee, initial encounter: Secondary | ICD-10-CM | POA: Diagnosis not present

## 2021-03-28 DIAGNOSIS — M25561 Pain in right knee: Secondary | ICD-10-CM | POA: Diagnosis not present

## 2021-04-11 ENCOUNTER — Encounter: Payer: Self-pay | Admitting: Family Medicine

## 2021-04-11 ENCOUNTER — Ambulatory Visit (INDEPENDENT_AMBULATORY_CARE_PROVIDER_SITE_OTHER): Payer: Medicare HMO | Admitting: Family Medicine

## 2021-04-11 VITALS — BP 120/76 | HR 78 | Temp 98.6°F | Wt 200.0 lb

## 2021-04-11 DIAGNOSIS — H6123 Impacted cerumen, bilateral: Secondary | ICD-10-CM

## 2021-04-11 NOTE — Progress Notes (Signed)
   Subjective:    Patient ID: [name], female    DOB: 12/05/1950, 70 y.o.   MRN: [id number]  HPI Here for muffled hearing in both ears. No pain. The last time these were flushed out was July.    Review of Systems  Constitutional: Negative.   HENT:  Positive for hearing loss. Negative for congestion, ear pain, postnasal drip and sinus pressure.   Eyes: Negative.   Respiratory: Negative.        Objective:   Physical Exam Constitutional:      Appearance: Normal appearance.  HENT:     Right Ear: There is impacted cerumen.     Left Ear: There is impacted cerumen.     Nose: Nose normal.     Mouth/Throat:     Pharynx: Oropharynx is clear.  Eyes:     Conjunctiva/sclera: Conjunctivae normal.  Cardiovascular:     Rate and Rhythm: Normal rate and regular rhythm.     Pulses: Normal pulses.     Heart sounds: Normal heart sounds.  Pulmonary:     Effort: Pulmonary effort is normal.     Breath sounds: Normal breath sounds.  Lymphadenopathy:     Cervical: No cervical adenopathy.  Neurological:     Mental Status: She is alert.          Assessment & Plan:  Bilateral cerumen impactions. After informed consent was obtained, both ears were successfully irrigated clear with water. She tolerated the procedure well. Follow up as needed.  Alysia Penna, MD

## 2021-04-12 ENCOUNTER — Encounter: Payer: Self-pay | Admitting: Family Medicine

## 2021-04-14 ENCOUNTER — Other Ambulatory Visit: Payer: Self-pay | Admitting: Family Medicine

## 2021-04-19 ENCOUNTER — Encounter: Payer: Self-pay | Admitting: Family Medicine

## 2021-04-21 NOTE — Telephone Encounter (Signed)
Please find out what her insurance will cover and let me know

## 2021-04-22 ENCOUNTER — Other Ambulatory Visit: Payer: Self-pay

## 2021-04-22 ENCOUNTER — Ambulatory Visit: Payer: Medicare HMO

## 2021-04-22 MED ORDER — OXYBUTYNIN CHLORIDE ER 10 MG PO TB24: 10.0000 mg | ORAL_TABLET | Freq: Every day | ORAL | 3 refills | Status: DC

## 2021-04-22 NOTE — Telephone Encounter (Signed)
Please cancel the Oxybutynin and refill the Tolteridine for one year

## 2021-04-22 NOTE — Telephone Encounter (Signed)
Cancel the Tolteridine and call in Oxybutynin XL 10 mg daily, #90 with 3 rf

## 2021-04-23 ENCOUNTER — Other Ambulatory Visit: Payer: Self-pay

## 2021-04-23 MED ORDER — TOLTERODINE TARTRATE ER 4 MG PO CP24: 4.0000 mg | ORAL_CAPSULE | Freq: Every day | ORAL | 5 refills | Status: DC

## 2021-04-23 NOTE — Telephone Encounter (Signed)
Rx for Oxybutynin was d/c per Dr Sarajane Jews advise, New Rx for Tolteridine 4 mg was sent to pt pharmacy, pt was notified

## 2021-04-23 NOTE — Telephone Encounter (Signed)
Sorry I will not be at the office tomorrow or Friday please come by on Monday before 5 pm

## 2021-04-25 ENCOUNTER — Encounter: Payer: Self-pay | Admitting: Family Medicine

## 2021-04-25 MED ORDER — HYDROCODONE-ACETAMINOPHEN 10-325 MG PO TABS: 1.0000 | ORAL_TABLET | Freq: Three times a day (TID) | ORAL | 0 refills | Status: DC | PRN

## 2021-04-25 NOTE — Telephone Encounter (Signed)
I will send in 1 refill in Dr. Barbie Banner absence.  Since his last prescription was for #90 I do not feel comfortable increasing this above that until he can return and clarify

## 2021-04-26 ENCOUNTER — Other Ambulatory Visit: Payer: Self-pay

## 2021-04-26 ENCOUNTER — Ambulatory Visit (HOSPITAL_COMMUNITY)
Admission: EM | Admit: 2021-04-26 | Discharge: 2021-04-26 | Disposition: A | Discharge status: Home / Self Care | Payer: Medicare HMO | Attending: Physician Assistant | Admitting: Physician Assistant

## 2021-04-26 ENCOUNTER — Encounter (HOSPITAL_COMMUNITY): Payer: Self-pay | Admitting: Emergency Medicine

## 2021-04-26 DIAGNOSIS — J329 Chronic sinusitis, unspecified: Secondary | ICD-10-CM | POA: Insufficient documentation

## 2021-04-26 DIAGNOSIS — Z87891 Personal history of nicotine dependence: Secondary | ICD-10-CM | POA: Diagnosis not present

## 2021-04-26 DIAGNOSIS — Z20822 Contact with and (suspected) exposure to covid-19: Secondary | ICD-10-CM | POA: Diagnosis not present

## 2021-04-26 DIAGNOSIS — Z8616 Personal history of COVID-19: Secondary | ICD-10-CM | POA: Insufficient documentation

## 2021-04-26 DIAGNOSIS — J40 Bronchitis, not specified as acute or chronic: Secondary | ICD-10-CM | POA: Diagnosis not present

## 2021-04-26 DIAGNOSIS — Z880 Allergy status to penicillin: Secondary | ICD-10-CM | POA: Insufficient documentation

## 2021-04-26 DIAGNOSIS — R051 Acute cough: Secondary | ICD-10-CM | POA: Diagnosis not present

## 2021-04-26 MED ORDER — BENZONATATE 100 MG PO CAPS: 100.0000 mg | ORAL_CAPSULE | Freq: Three times a day (TID) | ORAL | 0 refills | Status: DC

## 2021-04-26 MED ORDER — DOXYCYCLINE HYCLATE 100 MG PO CAPS: 100.0000 mg | ORAL_CAPSULE | Freq: Two times a day (BID) | ORAL | 0 refills | Status: DC

## 2021-04-26 NOTE — ED Triage Notes (Signed)
Patient c/o bilateral ear pain, productive cough, and sore throat x 5 days.   Patient denies fever at home.   Patient endorses nasal drainage.   Patient has used Listerine gargles and Nyquil with no relief of symptoms.

## 2021-04-26 NOTE — Discharge Instructions (Signed)
We will contact you if your COVID test is positive.  Typically we do not start antibiotics until you have been sick for 10 days but given your recent worsening I have sent in a prescription for doxycycline.  Please do not start taking this for another 1 to 2 days and only if symptoms are not improving.  Stay out of the sun while on this medication.  You can use Tessalon up to 3 times a [place] as needed for cough.  Use over-the-counter medications including Tylenol, Mucinex, Flonase for symptom relief.  If your symptoms or not improving after antibiotics please follow-up with your PCP.  If at any point you have worsening symptoms including high fever, chest pain, worsening cough, shortness of breath, nausea/vomiting interfering with oral intake you need to be seen immediately.

## 2021-04-26 NOTE — ED Provider Notes (Signed)
[facility]    CSN: [id number] Arrival date & time: 04/26/21  1016      History   Chief Complaint Chief Complaint  Patient presents with   Otalgia   Cough   Sore Throat    HPI 23 B Carboni is a 70 y.o. female.   Patient presents today with a 5+ [place]insky history of URI symptoms that have significantly worsened in the past 24 hours.  She reports ear pain, productive cough, sore throat, fatigue, malaise.  She denies any fever, chest pain, shortness of breath, nausea, vomiting.  She has had her COVID-19 vaccines including boosters.  Tested positive for COVID-26 November 2020 was treated with Paxlovid.  She denies any known sick contacts and stays away from other people by working from home.  She has had a flu shot.  She denies history of allergies but does have a history of asthma when she was younger though she has not required albuterol inhaler in adulthood.  She is a former smoker who quit 8 months ago.  Denies history of COPD.  Denies any recent antibiotic use.  She has tried over-the-counter medication for symptom management including DayQuil and NyQuil without improvement of symptoms.  She is unable to sleep as result of cough which has significantly worsening prompting evaluation today.   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/20/2020   Dyspnea    History of colostomy reversal 12/07/2019   Hypertension    Large bowel obstruction ([place]) 04/13/2019   Neuromuscular disorder ([place])    nerve pain in neck   Neuropathy    Urinary incontinence    Wears contact lenses    Wears dentures     Patient Active Problem List   Diagnosis Date Noted   COVID-19 virus infection 11/22/2020   Neoplasm of uncertain behavior of skin 10/25/2020   Seborrheic dermatitis 10/25/2020   Incarcerated incisional hernia 10/25/2020   Ventral hernia 10/25/2020   Incisional hernia  08/20/2020   Obesity (BMI 30-39.9) 08/20/2020   Sciatic neuralgia 08/20/2020   Chronic neck pain 08/20/2020   Chronic narcotic dependence ([place]) 08/20/2020   History of colonic diverticulitis 08/20/2020   Irritable bowel syndrome with both constipation and diarrhea 06/11/2017   Tobacco abuse 05/03/2012   HTN (hypertension) 12/09/2011   INFLAMED SEBORRHEIC KERATOSIS 04/29/2010   DEGENERATIVE [place] DISEASE, CERVICAL SPINE 04/29/2010   EDEMA 01/01/2009   CERUMEN IMPACTION 10/19/2008   HEADACHE 10/19/2008   URINARY INCONTINENCE 10/19/2008    Past Surgical History:  Procedure Laterality Date   ABDOMINAL HYSTERECTOMY     oophorectomy   APPENDECTOMY     CERVICAL FUSION  2009   and plating   COLECTOMY WITH COLOSTOMY CREATION/HARTMANN PROCEDURE N/A 04/14/2019   Procedure: SIGMOID COLECTOMY, RESECTION OF SMALL BOWEL,WITH END COLOSTOMY, PROCTOSCOPY RIGID;  Surgeon: Alphonsa Overall, MD;  Location: [facility];  Service: General;  Laterality: N/A;   COLONOSCOPY  08/29/2019   per Dr. Hilarie Fredrickson, diverticulosis and one benign polyp, repeat in 10  yrs    COLOSTOMY TAKEDOWN N/A 12/07/2019   Procedure: LAPAROSCOPIC COLOSTOMY REVERSAL;  Surgeon: Alphonsa Overall, MD;  Location: [facility];  Service: General;  Laterality: N/A;   CYSTOSCOPY WITH STENT PLACEMENT Bilateral 04/14/2019   Procedure: CYSTOSCOPY WITH BILATERAL STENT PLACEMENT;  Surgeon: Robley Fries, MD;  Location: [facility];  Service: Urology;  Laterality: Bilateral;   DILATION AND CURETTAGE OF UTERUS  EAR CYST EXCISION Right 07/05/2014   Procedure: EXCISION FLEXOR SHEATH CYST WITH RELEASE A-1 PULLEY RIGHT SMALL FINGER;  Surgeon: Leanora Cover, MD;  Location: [facility];  Service: Orthopedics;  Laterality: Right;   EYE SURGERY Bilateral    cataract surgery   INSERTION OF MESH N/A 10/25/2020   Procedure: INSERTION OF MESH;  Surgeon: Michael Boston, MD;  Location: [facility];  Service: General;  Laterality: N/A;   LYSIS OF ADHESION N/A 10/25/2020    Procedure: LYSIS OF ADHESION;  Surgeon: Michael Boston, MD;  Location: [facility];  Service: General;  Laterality: N/A;   SPINE SURGERY     STOMACH SURGERY     TRIGGER FINGER RELEASE Right 07/05/2014   Procedure: RELEASE RIGHT SMALL FINGER/A-1 PULLEY;  Surgeon: Leanora Cover, MD;  Location: [facility];  Service: Orthopedics;  Laterality: Right;   TUBOPLASTY / TUBOTUBAL ANASTOMOSIS     VENTRAL HERNIA REPAIR N/A 10/25/2020   Procedure: LAPAROSCOPIC VENTRAL WALL HERNIA REPAIR;  Surgeon: Michael Boston, MD;  Location: [facility];  Service: General;  Laterality: N/A;    OB History   No obstetric history on file.      Home Medications    Prior to Admission medications   Medication Sig Start Date End Date Taking? Authorizing Provider  Ascorbic Acid (VITAMIN C PO) Take 1,000 mg by mouth daily. Ester C   Yes [provider]  benzonatate (TESSALON) 100 MG capsule Take 1 capsule (100 mg total) by mouth every 8 (eight) hours. 04/26/21  Yes Aydden Cumpian K, PA-C  doxycycline (VIBRAMYCIN) 100 MG capsule Take 1 capsule (100 mg total) by mouth 2 (two) times daily. 04/26/21  Yes Sakari Alkhatib K, PA-C  hydrochlorothiazide (HYDRODIURIL) 25 MG tablet Take 1 tablet (25 mg total) by mouth daily. 05/24/20  Yes Laurey Morale, MD  metoprolol succinate (TOPROL-XL) 100 MG 24 hr tablet TAKE 1 TABLET (100 MG TOTAL) BY MOUTH IN THE MORNING AND AT BEDTIME. 03/18/21  Yes Laurey Morale, MD  Multiple Vitamins-Minerals (MULTIVITAMIN WITH MINERALS) tablet Take 1 tablet by mouth daily.   Yes [provider]  tolterodine (DETROL LA) 4 MG 24 hr capsule Take 1 capsule (4 mg total) by mouth daily. 04/23/21  Yes Laurey Morale, MD  Biotin 1000 MCG tablet Take 1,500 mcg by mouth 3 (three) times daily.    [provider]  HYDROcodone-acetaminophen (NORCO) 10-325 MG tablet Take 1 tablet by mouth every 8 (eight) hours as needed for moderate pain. 05/20/21 06/19/21  Burchette, Alinda Sierras, MD    Family  History Family History  Problem Relation Age of Onset   Arthritis Other    Breast cancer Other    Hypertension Other    Breast cancer Mother    Colon cancer Neg Hx    Colon polyps Neg Hx    Esophageal cancer Neg Hx    Stomach cancer Neg Hx    Rectal cancer Neg Hx     Social History Social History   Tobacco Use   Smoking status: Former    Packs/Fritzler: 0.50    Types: Cigarettes    Quit date: 06/02/2019    Years since quitting: 1.9   Smokeless tobacco: Never   Tobacco comments:    3 weeks ago  Vaping Use   Vaping Use: Never used  Substance Use Topics   Alcohol use: No    Alcohol/week: 0.0 standard drinks   Drug use: Never     Allergies   Lisinopril, Penicillins, Egg [  eggs or egg-derived products], Other, and Adhesive [tape]   Review of Systems Review of Systems  Constitutional:  Positive for activity change and fatigue. Negative for appetite change and fever.  HENT:  Positive for congestion, ear pain, postnasal drip, sinus pressure and sore throat. Negative for sneezing.   Respiratory:  Positive for cough. Negative for shortness of breath.   Cardiovascular:  Negative for chest pain.  Gastrointestinal:  Negative for abdominal pain, diarrhea, nausea and vomiting.  Musculoskeletal:  Negative for arthralgias and myalgias.  Neurological:  Positive for headaches. Negative for dizziness and light-headedness.    Physical Exam Triage Vital Signs ED Triage Vitals  Enc Vitals Group     BP 04/26/21 1043 116/73     Pulse Rate 04/26/21 1043 93     Resp 04/26/21 1043 16     Temp 04/26/21 1043 98.6 F (37 C)     Temp Source 04/26/21 1043 Oral     SpO2 04/26/21 1043 95 %     Weight --      Height --      Head Circumference --      Peak Flow --      Pain Score 04/26/21 1044 6     Pain Loc --      Pain Edu? --      Excl. in [place]? --    No data found.  Updated Vital Signs BP 116/73 (BP Location: Left Arm)    Pulse 93    Temp 98.6 F (37 C) (Oral)    Resp 16    SpO2 95%    Visual Acuity Right Eye Distance:   Left Eye Distance:   Bilateral Distance:    Right Eye Near:   Left Eye Near:    Bilateral Near:     Physical Exam Vitals reviewed.  Constitutional:      General: She is awake. She is not in acute distress.    Appearance: Normal appearance. She is well-developed. She is not ill-appearing.     Comments: Very pleasant female appears stated age in no acute distress sitting comfortably in exam room  HENT:     Head: Normocephalic and atraumatic.     Right Ear: Tympanic membrane, ear canal and external ear normal. Tympanic membrane is not erythematous or bulging.     Left Ear: Tympanic membrane, ear canal and external ear normal. Tympanic membrane is not erythematous or bulging.     Mouth/Throat:     Pharynx: Uvula midline. Posterior oropharyngeal erythema present. No oropharyngeal exudate.     Comments: Erythema and drainage in posterior oropharynx Cardiovascular:     Rate and Rhythm: Normal rate and regular rhythm.     Heart sounds: Normal heart sounds, S1 normal and S2 normal. No murmur heard. Pulmonary:     Effort: Pulmonary effort is normal.     Breath sounds: Rhonchi present. No wheezing or rales.     Comments: Scattered rhonchi partially cleared with cough Psychiatric:        Behavior: Behavior is cooperative.     UC Treatments / Results  Labs (all labs ordered are listed, but only abnormal results are displayed) Labs Reviewed  SARS CORONAVIRUS 2 (TAT 6-24 HRS)    EKG   Radiology No results found.  Procedures Procedures (including critical care time)  Medications Ordered in UC Medications - No data to display  Initial Impression / Assessment and Plan / UC Course  I have reviewed the triage vital signs and the nursing notes.  Pertinent labs & imaging results that were available during my care of the patient were reviewed by me and considered in my medical decision making (see chart for details).     Discussed limited  utility of viral testing given patient has been symptomatic for more than 5 days and this would not change management since she is outside the window of effectiveness for antivirals.  Patient was interested in COVID-19 testing and so this was obtained.  Flu testing was deferred.  Discussed likely viral etiology of symptoms but patient reports recent double worsening of symptoms and so was prescribed an antibiotic with instruction to not take this for several days and only begin it if symptoms continue to worsen/do not improve.  Given history of penicillin allergy she was prescribed doxycycline.  She was given Tessalon for cough.  Recommended she use over-the-counter medications including Flonase, Mucinex, Tylenol.  She is to rest and drink plenty of fluid.  Discussed alarm symptoms that warrant going to the emergency room including chest pain, worsening cough, shortness of breath, nausea/vomiting interfering with oral intake.  Strict return precautions given to which she expressed understanding.  Final Clinical Impressions(s) / UC Diagnoses   Final diagnoses:  Sinobronchitis  Acute cough     Discharge Instructions      We will contact you if your COVID test is positive.  Typically we do not start antibiotics until you have been sick for 10 days but given your recent worsening I have sent in a prescription for doxycycline.  Please do not start taking this for another 1 to 2 days and only if symptoms are not improving.  Stay out of the sun while on this medication.  You can use Tessalon up to 3 times a [place] as needed for cough.  Use over-the-counter medications including Tylenol, Mucinex, Flonase for symptom relief.  If your symptoms or not improving after antibiotics please follow-up with your PCP.  If at any point you have worsening symptoms including high fever, chest pain, worsening cough, shortness of breath, nausea/vomiting interfering with oral intake you need to be seen immediately.     ED  Prescriptions     Medication Sig Dispense Auth. Provider   doxycycline (VIBRAMYCIN) 100 MG capsule Take 1 capsule (100 mg total) by mouth 2 (two) times daily. 20 capsule Bryssa Tones K, PA-C   benzonatate (TESSALON) 100 MG capsule Take 1 capsule (100 mg total) by mouth every 8 (eight) hours. 21 capsule Kj Imbert K, PA-C      PDMP not reviewed this encounter.   Terrilee Croak, PA-C 04/26/21 1123

## 2021-04-27 LAB — SARS CORONAVIRUS 2 (TAT 6-24 HRS): SARS Coronavirus 2: NEGATIVE

## 2021-04-28 MED ORDER — HYDROCODONE-ACETAMINOPHEN 10-325 MG PO TABS: 1.0000 | ORAL_TABLET | Freq: Three times a day (TID) | ORAL | 0 refills | Status: AC | PRN

## 2021-04-28 MED ORDER — HYDROCODONE-ACETAMINOPHEN 10-325 MG PO TABS: 1.0000 | ORAL_TABLET | Freq: Three times a day (TID) | ORAL | 0 refills | Status: DC | PRN

## 2021-04-28 NOTE — Addendum Note (Signed)
Addended by: Alysia Penna A on: 04/28/2021 01:27 PM   Modules accepted: Orders

## 2021-04-28 NOTE — Telephone Encounter (Signed)
Pt pharmacy was advise to cancel previous Prescriptions per DR fry, verbalized understanding

## 2021-04-28 NOTE — Telephone Encounter (Signed)
I apologize for the confusion. I just sent in refills for December and for January for #120. Please call the pharmacy to cancel ALL other orders for this

## 2021-05-02 ENCOUNTER — Ambulatory Visit (INDEPENDENT_AMBULATORY_CARE_PROVIDER_SITE_OTHER): Payer: Medicare HMO | Admitting: Family Medicine

## 2021-05-02 ENCOUNTER — Encounter: Payer: Self-pay | Admitting: Family Medicine

## 2021-05-02 VITALS — BP 118/68 | HR 81 | Temp 98.7°F | Ht 64.0 in | Wt 197.0 lb

## 2021-05-02 DIAGNOSIS — G8929 Other chronic pain: Secondary | ICD-10-CM

## 2021-05-02 DIAGNOSIS — R739 Hyperglycemia, unspecified: Secondary | ICD-10-CM | POA: Diagnosis not present

## 2021-05-02 DIAGNOSIS — K582 Mixed irritable bowel syndrome: Secondary | ICD-10-CM | POA: Diagnosis not present

## 2021-05-02 DIAGNOSIS — L2084 Intrinsic (allergic) eczema: Secondary | ICD-10-CM | POA: Diagnosis not present

## 2021-05-02 DIAGNOSIS — I10 Essential (primary) hypertension: Secondary | ICD-10-CM

## 2021-05-02 DIAGNOSIS — M542 Cervicalgia: Secondary | ICD-10-CM | POA: Diagnosis not present

## 2021-05-02 MED ORDER — TRIAMCINOLONE ACETONIDE 0.1 % EX CREA: 1.0000 "application " | TOPICAL_CREAM | Freq: Two times a day (BID) | CUTANEOUS | 2 refills | Status: DC

## 2021-05-02 NOTE — Progress Notes (Signed)
Subjective:    Patient ID: [name], female    DOB: 1951/04/02, 70 y.o.   MRN: [id number]  HPI Here to follow up on issues. She feels in general. She has chronic neck pain and she sees [place] for pain management. Her BP has been stable. Her IBS is stable. Shs asks about patches of rash on her fingers and hands that itch and burn. These appeared a few months ago.    Review of Systems  Constitutional: Negative.   HENT: Negative.    Eyes: Negative.   Respiratory: Negative.    Cardiovascular: Negative.   Gastrointestinal: Negative.   Genitourinary:  Negative for decreased urine volume, difficulty urinating, dyspareunia, dysuria, enuresis, flank pain, frequency, hematuria, pelvic pain and urgency.  Musculoskeletal:  Positive for neck pain.  Skin:  Positive for rash.  Neurological: Negative.  Negative for headaches.  Psychiatric/Behavioral: Negative.        Objective:   Physical Exam Constitutional:      General: She is not in acute distress.    Appearance: Normal appearance. She is well-developed.  HENT:     Head: Normocephalic and atraumatic.     Right Ear: External ear normal.     Left Ear: External ear normal.     Nose: Nose normal.     Mouth/Throat:     Pharynx: No oropharyngeal exudate.  Eyes:     General: No scleral icterus.    Conjunctiva/sclera: Conjunctivae normal.     Pupils: Pupils are equal, round, and reactive to light.  Neck:     Thyroid: No thyromegaly.     Vascular: No JVD.  Cardiovascular:     Rate and Rhythm: Normal rate and regular rhythm.     Heart sounds: Normal heart sounds. No murmur heard.   No friction rub. No gallop.  Pulmonary:     Effort: Pulmonary effort is normal. No respiratory distress.     Breath sounds: Normal breath sounds. No wheezing or rales.  Chest:     Chest wall: No tenderness.  Abdominal:     General: Bowel sounds are normal. There is no distension.     Palpations: Abdomen is soft. There is no mass.     Tenderness: There is  no abdominal tenderness. There is no guarding or rebound.  Musculoskeletal:        General: No tenderness. Normal range of motion.     Cervical back: Normal range of motion and neck supple.  Lymphadenopathy:     Cervical: No cervical adenopathy.  Skin:    General: Skin is warm and dry.     Findings: No erythema or rash.     Comments: Patches of red, scaly skin on both hands   Neurological:     Mental Status: She is alert and oriented to person, place, and time.     Cranial Nerves: No cranial nerve deficit.     Motor: No abnormal muscle tone.     Coordination: Coordination normal.     Deep Tendon Reflexes: Reflexes are normal and symmetric. Reflexes normal.  Psychiatric:        Behavior: Behavior normal.        Thought Content: Thought content normal.        Judgment: Judgment normal.          Assessment & Plan:  Her HTN and neck pain are stable. Her IBS is well controlled. She will get fasting labs to check lipids, etc. She has eczema on the hands, and she  will try Triamcinolone cream for this. Alysia Penna, MD

## 2021-05-08 ENCOUNTER — Other Ambulatory Visit: Payer: Medicare HMO

## 2021-05-13 ENCOUNTER — Other Ambulatory Visit (INDEPENDENT_AMBULATORY_CARE_PROVIDER_SITE_OTHER): Payer: Medicare Other

## 2021-05-13 DIAGNOSIS — I10 Essential (primary) hypertension: Secondary | ICD-10-CM | POA: Diagnosis not present

## 2021-05-13 DIAGNOSIS — R739 Hyperglycemia, unspecified: Secondary | ICD-10-CM

## 2021-05-13 LAB — HEPATIC FUNCTION PANEL
ALT: 10 U/L (ref 0–35)
AST: 13 U/L (ref 0–37)
Albumin: 3.9 g/dL (ref 3.5–5.2)
Alkaline Phosphatase: 90 U/L (ref 39–117)
Bilirubin, Direct: 0.1 mg/dL (ref 0.0–0.3)
Total Bilirubin: 0.5 mg/dL (ref 0.2–1.2)
Total Protein: 8 g/dL (ref 6.0–8.3)

## 2021-05-13 LAB — LIPID PANEL
Cholesterol: 167 mg/dL (ref 0–200)
HDL: 42.5 mg/dL (ref >39.00)
LDL Cholesterol: 101 mg/dL — ABNORMAL HIGH (ref 0–99)
NonHDL: 124.74
Total CHOL/HDL Ratio: 4
Triglycerides: 119 mg/dL (ref 0.0–149.0)
VLDL: 23.8 mg/dL (ref 0.0–40.0)

## 2021-05-13 LAB — BASIC METABOLIC PANEL
BUN: 18 mg/dL (ref 6–23)
CO2: 30 mEq/L (ref 19–32)
Calcium: 9.5 mg/dL (ref 8.4–10.5)
Chloride: 98 mEq/L (ref 96–112)
Creatinine, Ser: 0.82 mg/dL (ref 0.40–1.20)
GFR: 73.21 mL/min (ref >60.00)
Glucose, Bld: 107 mg/dL — ABNORMAL HIGH (ref 70–99)
Potassium: 4.3 mEq/L (ref 3.5–5.1)
Sodium: 135 mEq/L (ref 135–145)

## 2021-05-13 LAB — TSH: TSH: 0.89 u[IU]/mL (ref 0.35–5.50)

## 2021-05-13 LAB — CBC WITH DIFFERENTIAL/PLATELET
Basophils Absolute: 0.1 10*3/uL (ref 0.0–0.1)
Basophils Relative: 1.1 % (ref 0.0–3.0)
Eosinophils Absolute: 0.2 10*3/uL (ref 0.0–0.7)
Eosinophils Relative: 1.9 % (ref 0.0–5.0)
HCT: 38.3 % (ref 36.0–46.0)
Hemoglobin: 12.3 g/dL (ref 12.0–15.0)
Lymphocytes Relative: 21.6 % (ref 12.0–46.0)
Lymphs Abs: 2.4 10*3/uL (ref 0.7–4.0)
MCHC: 32 g/dL (ref 30.0–36.0)
MCV: 81.9 fl (ref 78.0–100.0)
Monocytes Absolute: 1 10*3/uL (ref 0.1–1.0)
Monocytes Relative: 8.9 % (ref 3.0–12.0)
Neutro Abs: 7.5 10*3/uL (ref 1.4–7.7)
Neutrophils Relative %: 66.5 % (ref 43.0–77.0)
Platelets: 354 10*3/uL (ref 150.0–400.0)
RBC: 4.68 Mil/uL (ref 3.87–5.11)
RDW: 15.1 % (ref 11.5–15.5)
WBC: 11.2 10*3/uL — ABNORMAL HIGH (ref 4.0–10.5)

## 2021-05-13 LAB — HEMOGLOBIN A1C: Hgb A1c MFr Bld: 6.6 % — ABNORMAL HIGH (ref 4.6–6.5)

## 2021-06-03 ENCOUNTER — Other Ambulatory Visit: Payer: Self-pay | Admitting: Family Medicine

## 2021-06-21 ENCOUNTER — Other Ambulatory Visit: Payer: Self-pay | Admitting: Family Medicine

## 2021-06-21 DIAGNOSIS — I10 Essential (primary) hypertension: Secondary | ICD-10-CM

## 2021-07-02 ENCOUNTER — Encounter: Payer: Self-pay | Admitting: Family Medicine

## 2021-07-02 ENCOUNTER — Ambulatory Visit (INDEPENDENT_AMBULATORY_CARE_PROVIDER_SITE_OTHER): Payer: Medicare Other | Admitting: Family Medicine

## 2021-07-02 VITALS — BP 112/70 | HR 81 | Temp 99.1°F | Wt 196.0 lb

## 2021-07-02 DIAGNOSIS — M5030 Other cervical disc degeneration, unspecified cervical region: Secondary | ICD-10-CM

## 2021-07-02 DIAGNOSIS — F1190 Opioid use, unspecified, uncomplicated: Secondary | ICD-10-CM

## 2021-07-02 DIAGNOSIS — G8929 Other chronic pain: Secondary | ICD-10-CM | POA: Diagnosis not present

## 2021-07-02 DIAGNOSIS — M542 Cervicalgia: Secondary | ICD-10-CM | POA: Diagnosis not present

## 2021-07-02 MED ORDER — AMMONIUM LACTATE 12 % EX LOTN: 1.0000 "application " | TOPICAL_LOTION | Freq: Two times a day (BID) | CUTANEOUS | 5 refills | Status: DC

## 2021-07-02 MED ORDER — HYDROCODONE-ACETAMINOPHEN 10-325 MG PO TABS
1.0000 | ORAL_TABLET | Freq: Four times a day (QID) | ORAL | 0 refills | Status: DC | PRN
Start: 2021-07-30 — End: 2021-07-02

## 2021-07-02 MED ORDER — HYDROCODONE-ACETAMINOPHEN 10-325 MG PO TABS: 1.0000 | ORAL_TABLET | Freq: Four times a day (QID) | ORAL | 0 refills | Status: DC | PRN

## 2021-07-02 NOTE — Progress Notes (Signed)
° °  Subjective:    Patient ID: [name], female    DOB: [birth year], 71 y.o.   MRN: [id number]  HPI Here for pain management. Her neck pain is about the same. The medication is helping as usual.    Review of Systems  Constitutional: Negative.   Respiratory: Negative.    Cardiovascular: Negative.   Musculoskeletal:  Positive for neck pain.      Objective:   Physical Exam Constitutional:      General: She is not in acute distress.    Appearance: Normal appearance.  Cardiovascular:     Rate and Rhythm: Normal rate and regular rhythm.     Pulses: Normal pulses.     Heart sounds: Normal heart sounds.  Musculoskeletal:     Comments: The neck is not tender but ROM is limited by pain   Neurological:     Mental Status: She is alert.          Assessment & Plan:  Pain management. Indication for chronic opioid: neck pain Medication and dose: Norco 10-325 # pills per month: 120 Last UDS date: 09-11-20 Opioid Treatment Agreement signed (Y/N): 06-03-18 Opioid Treatment Agreement last reviewed with patient:  07-02-21 [facility] reviewed this encounter (include red flags): Yes Meds were refilled.  Alysia Penna, MD

## 2021-07-18 DIAGNOSIS — M25561 Pain in right knee: Secondary | ICD-10-CM | POA: Diagnosis not present

## 2021-08-11 DIAGNOSIS — S83241S Other tear of medial meniscus, current injury, right knee, sequela: Secondary | ICD-10-CM | POA: Diagnosis not present

## 2021-08-11 DIAGNOSIS — M25561 Pain in right knee: Secondary | ICD-10-CM | POA: Diagnosis not present

## 2021-08-28 DIAGNOSIS — M94261 Chondromalacia, right knee: Secondary | ICD-10-CM | POA: Diagnosis not present

## 2021-08-28 DIAGNOSIS — S83231A Complex tear of medial meniscus, current injury, right knee, initial encounter: Secondary | ICD-10-CM | POA: Diagnosis not present

## 2021-08-28 DIAGNOSIS — S83271A Complex tear of lateral meniscus, current injury, right knee, initial encounter: Secondary | ICD-10-CM | POA: Diagnosis not present

## 2021-08-28 DIAGNOSIS — G8918 Other acute postprocedural pain: Secondary | ICD-10-CM | POA: Diagnosis not present

## 2021-09-04 DIAGNOSIS — M25561 Pain in right knee: Secondary | ICD-10-CM | POA: Diagnosis not present

## 2021-09-09 DIAGNOSIS — M25561 Pain in right knee: Secondary | ICD-10-CM | POA: Diagnosis not present

## 2021-09-10 ENCOUNTER — Ambulatory Visit (INDEPENDENT_AMBULATORY_CARE_PROVIDER_SITE_OTHER): Payer: Medicare Other | Admitting: Family Medicine

## 2021-09-10 ENCOUNTER — Ambulatory Visit: Payer: Medicare Other | Admitting: Family Medicine

## 2021-09-10 ENCOUNTER — Encounter: Payer: Self-pay | Admitting: Family Medicine

## 2021-09-10 VITALS — BP 128/68 | HR 70 | Temp 98.7°F | Wt 200.0 lb

## 2021-09-10 DIAGNOSIS — G8929 Other chronic pain: Secondary | ICD-10-CM | POA: Diagnosis not present

## 2021-09-10 DIAGNOSIS — F1190 Opioid use, unspecified, uncomplicated: Secondary | ICD-10-CM

## 2021-09-10 DIAGNOSIS — M542 Cervicalgia: Secondary | ICD-10-CM

## 2021-09-10 MED ORDER — HYDROCODONE-ACETAMINOPHEN 10-325 MG PO TABS: 1.0000 | ORAL_TABLET | Freq: Four times a day (QID) | ORAL | 0 refills | Status: DC | PRN

## 2021-09-10 NOTE — Progress Notes (Signed)
? ?  Subjective:  ? ? Patient ID: [name], female    DOB: [birth year], 71 y.o.   MRN: [id number] ? ?HPI ?Here for pain management. Her neck pain has been stable, but she underwent a right knee arthroscopy per Dr. Hillery Aldo on 08-28-21. This was to repair a torn meniscus. The surgery went well, and now she is getting PT.  ? ? ?Review of Systems  ?Constitutional: Negative.   ?Musculoskeletal:  Positive for arthralgias and neck pain.  ? ?   ?Objective:  ? Physical Exam ?Constitutional:   ?   Appearance: Normal appearance.  ?   Comments: Walks with a cane   ?Neurological:  ?   Mental Status: She is alert.  ? ? ? ? ? ?   ?Assessment & Plan:  ?Pain management.  ?Indication for chronic opioid: neck pain ?Medication and dose: Norco 10-325 ?# pills per month: 120 ?Last UDS date: 09-11-21 ?Opioid Treatment Agreement signed (Y/N): 06-03-18 ?Opioid Treatment Agreement last reviewed with patient:  09-11-21 ?[facility] reviewed this encounter (include red flags): Yes ?Meds were refilled.  ?Alysia Penna, MD ? ? ?

## 2021-09-12 DIAGNOSIS — M25561 Pain in right knee: Secondary | ICD-10-CM | POA: Diagnosis not present

## 2021-09-14 LAB — DRUG MONITOR, PANEL 1, W/CONF, URINE
Amphetamines: NEGATIVE ng/mL (ref <500)
Barbiturates: NEGATIVE ng/mL (ref <300)
Benzodiazepines: NEGATIVE ng/mL (ref <100)
Cocaine Metabolite: NEGATIVE ng/mL (ref <150)
Codeine: NEGATIVE ng/mL (ref <50)
Creatinine: 264.3 mg/dL (ref >=20.0)
Hydrocodone: 5864 ng/mL — ABNORMAL HIGH (ref <50)
Hydromorphone: 2951 ng/mL — ABNORMAL HIGH (ref <50)
Marijuana Metabolite: NEGATIVE ng/mL (ref <20)
Methadone Metabolite: NEGATIVE ng/mL (ref <100)
Morphine: NEGATIVE ng/mL (ref <50)
Norhydrocodone: 6350 ng/mL — ABNORMAL HIGH (ref <50)
Opiates: POSITIVE ng/mL — AB (ref <100)
Oxidant: NEGATIVE ug/mL (ref <200)
Oxycodone: NEGATIVE ng/mL (ref <100)
Phencyclidine: NEGATIVE ng/mL (ref <25)
pH: 5.3 (ref 4.5–9.0)

## 2021-09-14 LAB — DM TEMPLATE

## 2021-09-16 DIAGNOSIS — M25561 Pain in right knee: Secondary | ICD-10-CM | POA: Diagnosis not present

## 2021-09-18 DIAGNOSIS — M25561 Pain in right knee: Secondary | ICD-10-CM | POA: Diagnosis not present

## 2021-10-07 DIAGNOSIS — L301 Dyshidrosis [pompholyx]: Secondary | ICD-10-CM | POA: Diagnosis not present

## 2021-10-07 DIAGNOSIS — L308 Other specified dermatitis: Secondary | ICD-10-CM | POA: Diagnosis not present

## 2021-11-17 DIAGNOSIS — L309 Dermatitis, unspecified: Secondary | ICD-10-CM | POA: Diagnosis not present

## 2021-11-17 DIAGNOSIS — L301 Dyshidrosis [pompholyx]: Secondary | ICD-10-CM | POA: Diagnosis not present

## 2021-11-18 ENCOUNTER — Other Ambulatory Visit: Payer: Self-pay | Admitting: Family Medicine

## 2021-12-01 ENCOUNTER — Ambulatory Visit (INDEPENDENT_AMBULATORY_CARE_PROVIDER_SITE_OTHER): Payer: Medicare Other | Admitting: Family Medicine

## 2021-12-01 ENCOUNTER — Encounter: Payer: Self-pay | Admitting: Family Medicine

## 2021-12-01 VITALS — BP 128/78 | HR 88 | Temp 99.1°F | Wt 193.0 lb

## 2021-12-01 DIAGNOSIS — F1190 Opioid use, unspecified, uncomplicated: Secondary | ICD-10-CM | POA: Diagnosis not present

## 2021-12-01 DIAGNOSIS — M542 Cervicalgia: Secondary | ICD-10-CM

## 2021-12-01 DIAGNOSIS — G8929 Other chronic pain: Secondary | ICD-10-CM | POA: Diagnosis not present

## 2021-12-01 MED ORDER — HYDROCODONE-ACETAMINOPHEN 10-325 MG PO TABS
1.0000 | ORAL_TABLET | Freq: Four times a day (QID) | ORAL | 0 refills | Status: DC | PRN
Start: 2021-12-01 — End: 2021-12-01

## 2021-12-01 MED ORDER — HYDROCODONE-ACETAMINOPHEN 10-325 MG PO TABS
1.0000 | ORAL_TABLET | Freq: Four times a day (QID) | ORAL | 0 refills | Status: DC | PRN
Start: 2022-02-01 — End: 2022-02-20

## 2021-12-01 MED ORDER — HYDROCODONE-ACETAMINOPHEN 10-325 MG PO TABS
1.0000 | ORAL_TABLET | Freq: Four times a day (QID) | ORAL | 0 refills | Status: DC | PRN
Start: 2022-01-01 — End: 2021-12-01

## 2021-12-01 NOTE — Progress Notes (Signed)
   Subjective:    Patient ID: [name], female    DOB: [birth year], 71 y.o.   MRN: [id number]  HPI Here for pain management. She is doing well as far as neck and back pain. She is still recovering from recent knee surgery.   Review of Systems  Constitutional: Negative.   Musculoskeletal:  Positive for back pain and neck pain.       Objective:   Physical Exam Constitutional:      Appearance: Normal appearance.  Neurological:     Mental Status: She is alert.           Assessment & Plan:  Pain management. Indication for chronic opioid: neck pain Medication and dose: Norco 10-325 # pills per month: 120 Last UDS date: 09-11-21 Opioid Treatment Agreement signed (Y/N): 06-03-18 Opioid Treatment Agreement last reviewed with patient:  12-01-21 [facility] reviewed this encounter (include red flags): Yes Meds were refilled.  Alysia Penna, MD

## 2021-12-17 ENCOUNTER — Other Ambulatory Visit: Payer: Self-pay | Admitting: Family Medicine

## 2021-12-17 DIAGNOSIS — Z1231 Encounter for screening mammogram for malignant neoplasm of breast: Secondary | ICD-10-CM

## 2021-12-29 DIAGNOSIS — Z1231 Encounter for screening mammogram for malignant neoplasm of breast: Secondary | ICD-10-CM

## 2022-01-01 ENCOUNTER — Other Ambulatory Visit: Payer: Self-pay | Admitting: Family Medicine

## 2022-01-01 DIAGNOSIS — Z1231 Encounter for screening mammogram for malignant neoplasm of breast: Secondary | ICD-10-CM

## 2022-01-15 ENCOUNTER — Ambulatory Visit: Payer: Medicare Other | Admitting: Family Medicine

## 2022-01-19 DIAGNOSIS — Z1231 Encounter for screening mammogram for malignant neoplasm of breast: Secondary | ICD-10-CM

## 2022-01-26 ENCOUNTER — Other Ambulatory Visit: Payer: Self-pay | Admitting: Family Medicine

## 2022-01-26 DIAGNOSIS — Z1231 Encounter for screening mammogram for malignant neoplasm of breast: Secondary | ICD-10-CM

## 2022-02-15 ENCOUNTER — Other Ambulatory Visit: Payer: Self-pay | Admitting: Family Medicine

## 2022-02-20 ENCOUNTER — Ambulatory Visit (INDEPENDENT_AMBULATORY_CARE_PROVIDER_SITE_OTHER): Payer: Medicare Other

## 2022-02-20 ENCOUNTER — Ambulatory Visit (INDEPENDENT_AMBULATORY_CARE_PROVIDER_SITE_OTHER): Payer: Medicare Other | Admitting: Family Medicine

## 2022-02-20 ENCOUNTER — Ambulatory Visit
Admission: RE | Admit: 2022-02-20 | Discharge: 2022-02-20 | Disposition: A | Discharge status: Home / Self Care | Payer: Medicare Other | Source: Ambulatory Visit

## 2022-02-20 ENCOUNTER — Encounter: Payer: Self-pay | Admitting: Family Medicine

## 2022-02-20 VITALS — BP 108/60 | HR 78 | Temp 98.2°F | Wt 199.0 lb

## 2022-02-20 DIAGNOSIS — M542 Cervicalgia: Secondary | ICD-10-CM

## 2022-02-20 DIAGNOSIS — Z23 Encounter for immunization: Secondary | ICD-10-CM | POA: Diagnosis not present

## 2022-02-20 DIAGNOSIS — Z1231 Encounter for screening mammogram for malignant neoplasm of breast: Secondary | ICD-10-CM

## 2022-02-20 DIAGNOSIS — G8929 Other chronic pain: Secondary | ICD-10-CM | POA: Diagnosis not present

## 2022-02-20 DIAGNOSIS — R059 Cough, unspecified: Secondary | ICD-10-CM | POA: Diagnosis not present

## 2022-02-20 DIAGNOSIS — F1190 Opioid use, unspecified, uncomplicated: Secondary | ICD-10-CM | POA: Diagnosis not present

## 2022-02-20 MED ORDER — HYDROCODONE-ACETAMINOPHEN 10-325 MG PO TABS: 1.0000 | ORAL_TABLET | Freq: Four times a day (QID) | ORAL | 0 refills | Status: DC | PRN

## 2022-02-20 MED ORDER — FLUOCINOLONE ACETONIDE 0.025 % EX OINT: TOPICAL_OINTMENT | Freq: Two times a day (BID) | CUTANEOUS | 11 refills | Status: DC

## 2022-02-20 MED ORDER — HYDROCODONE-ACETAMINOPHEN 10-325 MG PO TABS: 1.0000 | ORAL_TABLET | Freq: Four times a day (QID) | ORAL | 0 refills | Status: AC | PRN

## 2022-02-20 NOTE — Addendum Note (Signed)
Addended by: Wyvonne Lenz on: 02/20/2022 03:08 PM   Modules accepted: Orders

## 2022-02-20 NOTE — Progress Notes (Signed)
   Subjective:    Patient ID: [name], female    DOB: [birth year], 71 y.o.   MRN: [id number]  HPI Here for pain management. She is doing well. She feels better when the weathers warm.    Review of Systems  Constitutional: Negative.   Musculoskeletal:  Positive for back pain and neck pain.       Objective:   Physical Exam Constitutional:      Appearance: Normal appearance.  Neurological:     Mental Status: She is alert.           Assessment & Plan:  Pain management. Indication for chronic opioid: neck pain Medication and dose: Norco 10-325 # pills per month: 120 Last UDS date: 09-11-21 Opioid Treatment Agreement signed (Y/N): 06-03-18 Opioid Treatment Agreement last reviewed with patient:  02-20-22 [place] reviewed this encounter (include red flags): Yes Meds were refilled.  Regina Penna, MD

## 2022-02-23 ENCOUNTER — Other Ambulatory Visit: Payer: Self-pay | Admitting: Family Medicine

## 2022-02-23 DIAGNOSIS — R928 Other abnormal and inconclusive findings on diagnostic imaging of breast: Secondary | ICD-10-CM

## 2022-03-04 ENCOUNTER — Other Ambulatory Visit (HOSPITAL_BASED_OUTPATIENT_CLINIC_OR_DEPARTMENT_OTHER): Payer: Self-pay

## 2022-03-04 ENCOUNTER — Encounter (HOSPITAL_BASED_OUTPATIENT_CLINIC_OR_DEPARTMENT_OTHER): Payer: Self-pay

## 2022-03-04 ENCOUNTER — Emergency Department (HOSPITAL_BASED_OUTPATIENT_CLINIC_OR_DEPARTMENT_OTHER)
Admission: EM | Admit: 2022-03-04 | Discharge: 2022-03-04 | Disposition: A | Discharge status: Home / Self Care | Payer: Medicare Other | Attending: Emergency Medicine | Admitting: Emergency Medicine

## 2022-03-04 ENCOUNTER — Other Ambulatory Visit: Payer: Self-pay

## 2022-03-04 DIAGNOSIS — L03012 Cellulitis of left finger: Secondary | ICD-10-CM | POA: Diagnosis not present

## 2022-03-04 DIAGNOSIS — F17200 Nicotine dependence, unspecified, uncomplicated: Secondary | ICD-10-CM | POA: Insufficient documentation

## 2022-03-04 DIAGNOSIS — R21 Rash and other nonspecific skin eruption: Secondary | ICD-10-CM | POA: Diagnosis present

## 2022-03-04 MED ORDER — DOXYCYCLINE HYCLATE 100 MG PO CAPS: 100.0000 mg | ORAL_CAPSULE | Freq: Two times a day (BID) | ORAL | 0 refills | Status: DC

## 2022-03-04 MED ORDER — DOXYCYCLINE HYCLATE 100 MG PO CAPS
100.0000 mg | ORAL_CAPSULE | Freq: Two times a day (BID) | ORAL | 0 refills | Status: DC
  Filled 2022-03-04: qty 20, 10d supply, fill #0

## 2022-03-04 NOTE — ED Provider Notes (Signed)
[facility] EMERGENCY DEPT Provider Note   CSN: [id number] Arrival date & time: 03/04/22  1131     History  Chief Complaint  Patient presents with   Insect Bite    [name] is a 71 y.o. female.  HPI   Patient with medical history of eczema, tobacco abuse, eczema presents today due to left index finger rash.  The started acutely yesterday while she was working at the computer.  Denies any outside exposures.  Slightly pruritic, warm to touch.  Is been spreading from her PIP proximally to her hand.  No associated pain with flexion or extension.  Denies any shortness of breath, hives,, with movement, injuries.  Home Medications Prior to Admission medications   Medication Sig Start Date End Date Taking? Authorizing Provider  Ascorbic Acid (VITAMIN C PO) Take 1,000 mg by mouth daily. Ester C    [provider]  doxycycline (VIBRAMYCIN) 100 MG capsule Take 1 capsule (100 mg total) by mouth 2 (two) times daily. 03/04/22   Sherrill Raring, PA-C  fluocinolone (SYNALAR) 0.025 % ointment Apply topically 2 (two) times daily. 02/20/22   Laurey Morale, MD  hydrochlorothiazide (HYDRODIURIL) 25 MG tablet TAKE 1 TABLET (25 MG TOTAL) BY MOUTH DAILY. 06/03/21   Laurey Morale, MD  HYDROcodone-acetaminophen (NORCO) 10-325 MG tablet Take 1 tablet by mouth every 6 (six) hours as needed for moderate pain. 05/02/22 06/01/22  Laurey Morale, MD  metoprolol succinate (TOPROL-XL) 100 MG 24 hr tablet TAKE 1 TABLET BY MOUTH IN THE MORNING AND AT BEDTIME EVERY [place] 06/23/21   Laurey Morale, MD  Multiple Vitamins-Minerals (MULTIVITAMIN WITH MINERALS) tablet Take 1 tablet by mouth daily.    [provider]  tolterodine (DETROL LA) 4 MG 24 hr capsule TAKE 1 CAPSULE BY MOUTH ONCE DAILY 02/16/22   Laurey Morale, MD      Allergies    Lisinopril, Penicillins, Egg [eggs or egg-derived products], Other, and Adhesive [tape]    Review of Systems   Review of Systems  Physical  Exam Updated Vital Signs BP (!) 156/79 (BP Location: Right Arm)   Pulse 83   Temp 98.5 F (36.9 C)   Resp 16   Ht '5\' 4"'$  (1.626 m)   Wt 90.3 kg   SpO2 99%   BMI 34.16 kg/m  Physical Exam Vitals and nursing note reviewed. Exam conducted with a chaperone present.  Constitutional:      General: She is not in acute distress.    Appearance: Normal appearance.  HENT:     Head: Normocephalic and atraumatic.  Eyes:     General: No scleral icterus.    Extraocular Movements: Extraocular movements intact.     Pupils: Pupils are equal, round, and reactive to light.  Cardiovascular:     Pulses: Normal pulses.  Musculoskeletal:        General: No swelling or tenderness. Normal range of motion.     Comments: Flexion, extension in tact at DIP PIP of left digits, no crepitus.   Skin:    Capillary Refill: Capillary refill takes less than 2 seconds.     Coloration: Skin is not jaundiced.     Findings: Erythema present.     Comments: Erythema and streaking from PIP proximally to MCP.  Warm to touch  Neurological:     Mental Status: She is alert. Mental status is at baseline.     Sensory: No sensory deficit.     Coordination: Coordination normal.  Comments: Extremity strength symmetric bilaterally.  Sensation circumferentially intact upper extremity.     ED Results / Procedures / Treatments   Labs (all labs ordered are listed, but only abnormal results are displayed) Labs Reviewed - No data to display  EKG None  Radiology No results found.  Procedures Procedures    Medications Ordered in ED Medications - No data to display  ED Course/ Medical Decision Making/ A&P                           Medical Decision Making Risk Prescription drug management.   Presents due to rash to left index finger.  She is neurovascular intact with complete active and passive ROM to digit.  No SIRS criteria, does not appear septic.  On exam she has full ROM, not consistent with septic joint.   There is no crepitus, no reproducible tenderness, complete ROM and atraumatic HPI so although I considered x-ray I do not think indicated. No systemic or allergic symptoms, not in anaphylaxis..  Suspect cellulitis, will cover with oral antibiotic and strict return precautions.  Patient will follow-up for wound recheck in 3 days and return if worse.    I reviewed external medical records, history of penicillin allergy.  I reviewed medication history I do not see any previous prescriptions for cephalosporins will be causing her symptoms with Doxy.   Final Clinical Impression(s) / ED Diagnoses Final diagnoses:  Cellulitis of finger of left hand    Rx / DC Orders ED Discharge Orders          Ordered    doxycycline (VIBRAMYCIN) 100 MG capsule  2 times daily,   Status:  Discontinued        03/04/22 1222    doxycycline (VIBRAMYCIN) 100 MG capsule  2 times daily        03/04/22 1222              Sherrill Raring, PA-C 03/04/22 1226    Elnora Morrison, MD 03/04/22 1612

## 2022-03-04 NOTE — ED Triage Notes (Signed)
Pt via pov from home with what may be a bug bite to her left index finger. The finger is slightly swollen and red. Pt states the pain and discoloration began yesterday afternoon. Pt alert & oriented, nad noted.

## 2022-03-04 NOTE — Discharge Instructions (Addendum)
You are seen today for cellulitis on your finger.  Take the Doxy twice daily for 7 days.  Follow-up with your primary in the next few 2-3 for recheck.  If you have worsening pain, and inability to flex extend the finger without pain, swelling of your hand or the redness is spreading up your arm you should return back to the ED for additional evaluation.

## 2022-03-04 NOTE — ED Notes (Signed)
Pt discharged home after verbalizing understanding of discharge instructions; nad noted. 

## 2022-03-05 ENCOUNTER — Ambulatory Visit (INDEPENDENT_AMBULATORY_CARE_PROVIDER_SITE_OTHER): Payer: Medicare Other | Admitting: Family Medicine

## 2022-03-05 ENCOUNTER — Encounter: Payer: Self-pay | Admitting: Family Medicine

## 2022-03-05 ENCOUNTER — Ambulatory Visit (INDEPENDENT_AMBULATORY_CARE_PROVIDER_SITE_OTHER): Payer: Medicare Other

## 2022-03-05 VITALS — BP 128/80 | HR 88 | Temp 98.4°F | Wt 194.0 lb

## 2022-03-05 DIAGNOSIS — M7989 Other specified soft tissue disorders: Secondary | ICD-10-CM | POA: Diagnosis not present

## 2022-03-05 DIAGNOSIS — M25561 Pain in right knee: Secondary | ICD-10-CM | POA: Diagnosis not present

## 2022-03-05 DIAGNOSIS — G8929 Other chronic pain: Secondary | ICD-10-CM

## 2022-03-05 DIAGNOSIS — M109 Gout, unspecified: Secondary | ICD-10-CM

## 2022-03-05 LAB — CBC WITH DIFFERENTIAL/PLATELET
Basophils Absolute: 0 10*3/uL (ref 0.0–0.1)
Basophils Relative: 0.2 % (ref 0.0–3.0)
Eosinophils Absolute: 0.1 10*3/uL (ref 0.0–0.7)
Eosinophils Relative: 0.8 % (ref 0.0–5.0)
HCT: 41.3 % (ref 36.0–46.0)
Hemoglobin: 13.1 g/dL (ref 12.0–15.0)
Lymphocytes Relative: 17.5 % (ref 12.0–46.0)
Lymphs Abs: 2.6 10*3/uL (ref 0.7–4.0)
MCHC: 31.6 g/dL (ref 30.0–36.0)
MCV: 80.4 fl (ref 78.0–100.0)
Monocytes Absolute: 1 10*3/uL (ref 0.1–1.0)
Monocytes Relative: 7 % (ref 3.0–12.0)
Neutro Abs: 11.1 10*3/uL — ABNORMAL HIGH (ref 1.4–7.7)
Neutrophils Relative %: 74.5 % (ref 43.0–77.0)
Platelets: 354 10*3/uL (ref 150.0–400.0)
RBC: 5.13 Mil/uL — ABNORMAL HIGH (ref 3.87–5.11)
RDW: 15.1 % (ref 11.5–15.5)
WBC: 14.9 10*3/uL — ABNORMAL HIGH (ref 4.0–10.5)

## 2022-03-05 LAB — URIC ACID: Uric Acid, Serum: 9.1 mg/dL — ABNORMAL HIGH (ref 2.4–7.0)

## 2022-03-05 MED ORDER — METHYLPREDNISOLONE 4 MG PO TBPK: ORAL_TABLET | ORAL | 0 refills | Status: DC

## 2022-03-05 NOTE — Progress Notes (Signed)
   Subjective:    Patient ID: [name], female    DOB: [birth year], 71 y.o.   MRN: [id number]  HPI Here for several issues, including follow up on an ED visit yesterday. 3 days ago she suddenly developed swelling and pain in the left index finger. No recent trauma. No fever. She went to the ED where this was diagnosed as cellulitis. She was started on a 10 [place] course of Doxycycline, and so far she has taken 3 doses of this. However the swelling and pain has continued to get worse. She notes that her sister has gout. She also asks [place] to check hier right knee. She has had some pain in the knee, especially when going up and down steps, for a long time, but this has been getting worse lately. She did tear a meniscus in this knee in March, and she ended up having arthroscopic surgery to repair this.    Review of Systems  Constitutional: Negative.   Respiratory: Negative.    Cardiovascular: Negative.   Musculoskeletal:  Positive for arthralgias.       Objective:   Physical Exam Constitutional:      General: She is not in acute distress.    Appearance: Normal appearance.  Cardiovascular:     Rate and Rhythm: Normal rate and regular rhythm.     Pulses: Normal pulses.     Heart sounds: Normal heart sounds.  Pulmonary:     Effort: Pulmonary effort is normal.     Breath sounds: Normal breath sounds.  Musculoskeletal:     Comments: The right knee is mildly swollen and it is tender along the joint spaces. ROM is full. There is no warmth or redness. The left index finger is red, swollen, warm, and quite tender around the PIP joint. ROM is limited by pain.   Neurological:     Mental Status: She is alert.           Assessment & Plan:  The finger pain is from gout, not cellulitis. She will stop the Doxycycline. She will take a Medrol dose pack. Get a CBC and uric acid level today. The right knee pain is likely due to osteoarthritis, and we will get Xrays of this today. Alysia Penna,  MD

## 2022-03-06 ENCOUNTER — Other Ambulatory Visit: Payer: Self-pay

## 2022-03-06 MED ORDER — ALLOPURINOL 300 MG PO TABS: 300.0000 mg | ORAL_TABLET | Freq: Every day | ORAL | 3 refills | Status: DC

## 2022-03-10 ENCOUNTER — Other Ambulatory Visit: Payer: Self-pay | Admitting: Family Medicine

## 2022-03-10 ENCOUNTER — Ambulatory Visit
Admission: RE | Admit: 2022-03-10 | Discharge: 2022-03-10 | Disposition: A | Discharge status: Home / Self Care | Payer: Medicare Other | Source: Ambulatory Visit | Attending: Family Medicine | Admitting: Family Medicine

## 2022-03-10 DIAGNOSIS — R928 Other abnormal and inconclusive findings on diagnostic imaging of breast: Secondary | ICD-10-CM | POA: Diagnosis not present

## 2022-03-10 DIAGNOSIS — R921 Mammographic calcification found on diagnostic imaging of breast: Secondary | ICD-10-CM | POA: Diagnosis not present

## 2022-03-26 ENCOUNTER — Encounter: Payer: Self-pay | Admitting: Family Medicine

## 2022-03-26 ENCOUNTER — Telehealth (INDEPENDENT_AMBULATORY_CARE_PROVIDER_SITE_OTHER): Payer: Medicare Other | Admitting: Family Medicine

## 2022-03-26 DIAGNOSIS — J40 Bronchitis, not specified as acute or chronic: Secondary | ICD-10-CM

## 2022-03-26 MED ORDER — AZITHROMYCIN 250 MG PO TABS: ORAL_TABLET | ORAL | 0 refills | Status: DC

## 2022-03-26 MED ORDER — HYDROCODONE BIT-HOMATROP MBR 5-1.5 MG/5ML PO SOLN: 5.0000 mL | ORAL | 0 refills | Status: DC | PRN

## 2022-03-26 NOTE — Progress Notes (Signed)
Subjective:    Patient ID: [name], female    DOB: 02/25/1951, 71 y.o.   MRN: [id number]  HPI Virtual Visit via Video Note  I connected with the patient on 03/26/22 at 10:00 AM EST by a video enabled telemedicine application and verified that I am speaking with the correct person using two identifiers.  Location patient: home Location provider:work or home office Persons participating in the virtual visit: patient, provider  I discussed the limitations of evaluation and management by telemedicine and the availability of in person appointments. The patient expressed understanding and agreed to proceed.   HPI: Here for one week of stuffy head, PND, chest congestion and coughing up yellow sputum. No fever or SOB. No ST. Using Nyquil.   ROS: See pertinent positives and negatives per HPI.  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/20/2020   Dyspnea    History of colostomy reversal 12/07/2019   Hypertension    Large bowel obstruction ([place]) 04/13/2019   Neuromuscular disorder ([place])    nerve pain in neck   Neuropathy    Urinary incontinence    Wears contact lenses    Wears dentures     Past Surgical History:  Procedure Laterality Date   ABDOMINAL HYSTERECTOMY     oophorectomy   APPENDECTOMY     CERVICAL FUSION  2009   and plating   COLECTOMY WITH COLOSTOMY CREATION/HARTMANN PROCEDURE N/A 04/14/2019   Procedure: SIGMOID COLECTOMY, RESECTION OF SMALL BOWEL,WITH END COLOSTOMY, PROCTOSCOPY RIGID;  Surgeon: Alphonsa Overall, MD;  Location: [facility];  Service: General;  Laterality: N/A;   COLONOSCOPY  08/29/2019   per Dr. Hilarie Fredrickson, diverticulosis and one benign polyp, repeat in 10  yrs    COLOSTOMY TAKEDOWN N/A 12/07/2019   Procedure: LAPAROSCOPIC COLOSTOMY REVERSAL;  Surgeon: Alphonsa Overall, MD;  Location: [facility];  Service: General;  Laterality: N/A;    CYSTOSCOPY WITH STENT PLACEMENT Bilateral 04/14/2019   Procedure: CYSTOSCOPY WITH BILATERAL STENT PLACEMENT;  Surgeon: Robley Fries, MD;  Location: [facility];  Service: Urology;  Laterality: Bilateral;   DILATION AND CURETTAGE OF UTERUS     EAR CYST EXCISION Right 07/05/2014   Procedure: EXCISION FLEXOR SHEATH CYST WITH RELEASE A-1 PULLEY RIGHT SMALL FINGER;  Surgeon: Leanora Cover, MD;  Location: [place];  Service: Orthopedics;  Laterality: Right;   EYE SURGERY Bilateral    cataract surgery   INSERTION OF MESH N/A 10/25/2020   Procedure: INSERTION OF MESH;  Surgeon: Michael Boston, MD;  Location: [place];  Service: General;  Laterality: N/A;   LYSIS OF ADHESION N/A 10/25/2020   Procedure: LYSIS OF ADHESION;  Surgeon: Michael Boston, MD;  Location: [place];  Service: General;  Laterality: N/A;   SPINE SURGERY     STOMACH SURGERY     TRIGGER FINGER RELEASE Right 07/05/2014   Procedure: RELEASE RIGHT SMALL FINGER/A-1 PULLEY;  Surgeon: Leanora Cover, MD;  Location: [place];  Service: Orthopedics;  Laterality: Right;   TUBOPLASTY / TUBOTUBAL ANASTOMOSIS     VENTRAL HERNIA REPAIR N/A 10/25/2020   Procedure: LAPAROSCOPIC VENTRAL WALL HERNIA REPAIR;  Surgeon: Michael Boston, MD;  Location: [place];  Service: General;  Laterality: N/A;    Family History  Problem Relation Age of Onset   Arthritis Other    Breast cancer Other    Hypertension Other    Breast  cancer Mother    Colon cancer Neg Hx    Colon polyps Neg Hx    Esophageal cancer Neg Hx    Stomach cancer Neg Hx    Rectal cancer Neg Hx      Current Outpatient Medications:    allopurinol (ZYLOPRIM) 300 MG tablet, Take 1 tablet (300 mg total) by mouth daily., Disp: 90 tablet, Rfl: 3   Ascorbic Acid (VITAMIN C PO), Take 1,000 mg by mouth daily. Ester C, Disp: , Rfl:    azithromycin (ZITHROMAX Z-PAK) 250 MG tablet, As directed, Disp: 6 each, Rfl: 0   fluocinolone (SYNALAR) 0.025 % ointment, Apply topically 2 (two)  times daily., Disp: 60 g, Rfl: 11   hydrochlorothiazide (HYDRODIURIL) 25 MG tablet, TAKE 1 TABLET (25 MG TOTAL) BY MOUTH DAILY., Disp: 90 tablet, Rfl: 3   HYDROcodone bit-homatropine (HYCODAN) 5-1.5 MG/5ML syrup, Take 5 mLs by mouth every 4 (four) hours as needed for cough., Disp: 240 mL, Rfl: 0   [START ON 05/02/2022] HYDROcodone-acetaminophen (NORCO) 10-325 MG tablet, Take 1 tablet by mouth every 6 (six) hours as needed for moderate pain., Disp: 120 tablet, Rfl: 0   metoprolol succinate (TOPROL-XL) 100 MG 24 hr tablet, TAKE 1 TABLET BY MOUTH IN THE MORNING AND AT BEDTIME EVERY [place], Disp: 180 tablet, Rfl: 1   Multiple Vitamins-Minerals (MULTIVITAMIN WITH MINERALS) tablet, Take 1 tablet by mouth daily., Disp: , Rfl:    tolterodine (DETROL LA) 4 MG 24 hr capsule, TAKE 1 CAPSULE BY MOUTH ONCE DAILY, Disp: 30 capsule, Rfl: 3  EXAM:  VITALS per patient if applicable:  GENERAL: alert, oriented, appears well and in no acute distress  HEENT: atraumatic, conjunttiva clear, no obvious abnormalities on inspection of external nose and ears  NECK: normal movements of the head and neck  LUNGS: on inspection no signs of respiratory distress, breathing rate appears normal, no obvious gross SOB, gasping or wheezing  CV: no obvious cyanosis  MS: moves all visible extremities without noticeable abnormality  PSYCH/NEURO: pleasant and cooperative, no obvious depression or anxiety, speech and thought processing grossly intact  ASSESSMENT AND PLAN: Bronchitis, treat with a Zpack. Recheck as needed.  Alysia Penna, MD  Discussed the following assessment and plan:  No diagnosis found.     I discussed the assessment and treatment plan with the patient. The patient was provided an opportunity to ask questions and all were answered. The patient agreed with the plan and demonstrated an understanding of the instructions.   The patient was advised to call back or seek an in-person evaluation if the symptoms  worsen or if the condition fails to improve as anticipated.      Review of Systems     Objective:   Physical Exam        Assessment & Plan:

## 2022-04-22 ENCOUNTER — Telehealth: Payer: Self-pay

## 2022-04-22 NOTE — Telephone Encounter (Signed)
Unsuccessful attempt to reach patient x2 on preferred number listed in notes for scheduled AWV. Left message on voicemail okay to reschedule.

## 2022-05-22 ENCOUNTER — Ambulatory Visit (INDEPENDENT_AMBULATORY_CARE_PROVIDER_SITE_OTHER): Payer: Medicare Other

## 2022-05-22 VITALS — BP 120/60 | HR 78 | Temp 97.9°F | Ht 64.0 in | Wt 199.3 lb

## 2022-05-22 DIAGNOSIS — Z13820 Encounter for screening for osteoporosis: Secondary | ICD-10-CM | POA: Diagnosis not present

## 2022-05-22 DIAGNOSIS — Z0000 Encounter for general adult medical examination without abnormal findings: Secondary | ICD-10-CM | POA: Diagnosis not present

## 2022-05-22 NOTE — Progress Notes (Signed)
Subjective:   Britini B Lasky is a 72 y.o. female who presents for Medicare Annual (Subsequent) preventive examination.  Review of Systems      Cardiac Risk Factors include: advanced age (>26mn, >>68women);obesity (BMI >30kg/m2);hypertension     Objective:    Today's Vitals   05/22/22 0911  BP: 120/60  Pulse: 78  Temp: 97.9 F (36.6 C)  TempSrc: Oral  SpO2: 98%  Weight: 199 lb 4.8 oz (90.4 kg)  Height: '5\' 4"'$  (1.626 m)   Body mass index is 34.21 kg/m.     05/22/2022    9:22 AM 03/04/2022   11:50 AM 03/14/2021   10:17 AM 10/25/2020   10:46 PM 10/25/2020   10:11 PM 10/21/2020    8:18 AM 08/12/2020   12:49 PM  Advanced Directives  Does Patient Have a Medical Advance Directive? Yes No No  No No Yes  Type of [place]      [place]  Does patient want to make changes to medical advance directive? No - Patient declined  No - Patient declined  No - Patient declined    Copy of HR[place]anin Chart? Yes - validated most recent copy scanned in chart (See row information)        Would patient like information on creating a medical advance directive?    No - Patient declined  Yes (MAU/Ambulatory/Procedural Areas - Information given)     Current Medications (verified) Outpatient Encounter Medications as of 05/22/2022  Medication Sig   allopurinol (ZYLOPRIM) 300 MG tablet Take 1 tablet (300 mg total) by mouth daily.   Ascorbic Acid (VITAMIN C PO) Take 1,000 mg by mouth daily. Ester C   azithromycin (ZITHROMAX Z-PAK) 250 MG tablet As directed   fluocinolone (SYNALAR) 0.025 % ointment Apply topically 2 (two) times daily.   hydrochlorothiazide (HYDRODIURIL) 25 MG tablet TAKE 1 TABLET (25 MG TOTAL) BY MOUTH DAILY.   HYDROcodone bit-homatropine (HYCODAN) 5-1.5 MG/5ML syrup Take 5 mLs by mouth every 4 (four) hours as needed for cough.   HYDROcodone-acetaminophen (NORCO) 10-325 MG tablet Take 1  tablet by mouth every 6 (six) hours as needed for moderate pain.   metoprolol succinate (TOPROL-XL) 100 MG 24 hr tablet TAKE 1 TABLET BY MOUTH IN THE MORNING AND AT BEDTIME EVERY [place]   Multiple Vitamins-Minerals (MULTIVITAMIN WITH MINERALS) tablet Take 1 tablet by mouth daily.   tolterodine (DETROL LA) 4 MG 24 hr capsule TAKE 1 CAPSULE BY MOUTH ONCE DAILY   No facility-administered encounter medications on file as of 05/22/2022.    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/20/2020   Dyspnea    History of colostomy reversal 12/07/2019   Hypertension    [place] bowel obstruction ([place] 04/13/2019   Neuromuscular disorder ([place]    nerve pain in neck   Neuropathy    Urinary incontinence    Wears contact lenses    Wears dentures    Past Surgical History:  Procedure Laterality Date   ABDOMINAL HYSTERECTOMY     oophorectomy   APPENDECTOMY     CERVICAL FUSION  2009   and plating   COLECTOMY WITH COLOSTOMY CREATION/HARTMANN PROCEDURE N/A 04/14/2019   Procedure: SIGMOID COLECTOMY, RESECTION OF SMALL BOWEL,WITH END  COLOSTOMY, PROCTOSCOPY RIGID;  Surgeon: Alphonsa Overall, MD;  Location: [facility];  Service: General;  Laterality: N/A;   COLONOSCOPY  08/29/2019   per Dr. Hilarie Fredrickson, diverticulosis and one benign polyp, repeat in 10  yrs    COLOSTOMY TAKEDOWN N/A 12/07/2019   Procedure: LAPAROSCOPIC COLOSTOMY REVERSAL;  Surgeon: Alphonsa Overall, MD;  Location: [facility];  Service: General;  Laterality: N/A;   CYSTOSCOPY WITH STENT PLACEMENT Bilateral 04/14/2019   Procedure: CYSTOSCOPY WITH BILATERAL STENT PLACEMENT;  Surgeon: Robley Fries, MD;  Location: [facility];  Service: Urology;  Laterality: Bilateral;   DILATION AND CURETTAGE OF UTERUS     EAR CYST EXCISION  Right 07/05/2014   Procedure: EXCISION FLEXOR SHEATH CYST WITH RELEASE A-1 PULLEY RIGHT SMALL FINGER;  Surgeon: Leanora Cover, MD;  Location: [place];  Service: Orthopedics;  Laterality: Right;   EYE SURGERY Bilateral    cataract surgery   INSERTION OF MESH N/A 10/25/2020   Procedure: INSERTION OF MESH;  Surgeon: Michael Boston, MD;  Location: [place];  Service: General;  Laterality: N/A;   KNEE SURGERY Right    LYSIS OF ADHESION N/A 10/25/2020   Procedure: LYSIS OF ADHESION;  Surgeon: Michael Boston, MD;  Location: [place];  Service: General;  Laterality: N/A;   SPINE SURGERY     STOMACH SURGERY     TRIGGER FINGER RELEASE Right 07/05/2014   Procedure: RELEASE RIGHT SMALL FINGER/A-1 PULLEY;  Surgeon: Leanora Cover, MD;  Location: [place];  Service: Orthopedics;  Laterality: Right;   TUBOPLASTY / TUBOTUBAL ANASTOMOSIS     VENTRAL HERNIA REPAIR N/A 10/25/2020   Procedure: LAPAROSCOPIC VENTRAL WALL HERNIA REPAIR;  Surgeon: Michael Boston, MD;  Location: [place];  Service: General;  Laterality: N/A;   Family History  Problem Relation Age of Onset   Arthritis Other    Breast cancer Other    Hypertension Other    Breast cancer Mother    Colon cancer Neg Hx    Colon polyps Neg Hx    Esophageal cancer Neg Hx    Stomach cancer Neg Hx    Rectal cancer Neg Hx    Social History   Socioeconomic History   Marital status: Married    Spouse name: Not on file   Number of children: Not on file   Years of education: Not on file   Highest education level: 12th grade  Occupational History   Not on file  Tobacco Use   Smoking status: Former    Packs/[place]: 0.50    Types: Cigarettes    Quit date: 06/02/2019    Years since quitting: 2.9   Smokeless tobacco: Never   Tobacco comments:    3 weeks ago  Vaping Use   Vaping Use: Never used  Substance and Sexual Activity   Alcohol use: No    Alcohol/week: 0.0 standard drinks of alcohol   Drug use: Never   Sexual activity:  Not on file  Other Topics Concern   Not on file  Social History Narrative   Not on file   Social Determinants of Health   Financial Resource Strain: Low Risk  (05/22/2022)   Overall Financial Resource Strain (CARDIA)    Difficulty of Paying Living Expenses: Not hard at all  Food Insecurity: No Food Insecurity (05/22/2022)   Hunger Vital Sign    Worried About Running Out of Food in the Last Year: Never true    Ran Out of Food in the Last Year: Never true  Transportation  Needs: No Transportation Needs (05/22/2022)   PRAPARE - Hydrologist (Medical): No    Lack of Transportation (Non-Medical): No  Physical Activity: Insufficiently Active (05/22/2022)   Exercise Vital Sign    Days of Exercise per Week: 1 [place]    Minutes of Exercise per Session: 20 min  Stress: No Stress Concern Present (05/22/2022)   [place]    Feeling of Stress : Not at all  Social Connections: [place] (05/22/2022)   Social Connection and Isolation Panel [NHANES]    Frequency of Communication with Friends and Family: More than three times a week    Frequency of Social Gatherings with Friends and Family: More than three times a week    Attends Religious Services: More than 4 times per year    Active Member of [employer] or Organizations: Yes    Attends Music therapist: More than 4 times per year    Marital Status: Married    Tobacco Counseling Counseling given: Not Answered Tobacco comments: 3 weeks ago   Clinical Intake:  Pre-visit preparation completed: No  Pain : No/denies pain     BMI - recorded: 34.21 Nutritional Status: BMI > 30  Obese Nutritional Risks: None Diabetes: No  How often do you need to have someone help you when you read instructions, pamphlets, or other written materials from your doctor or pharmacy?: 1 - Never  Diabetic?  No  Interpreter Needed?: No  Information  entered by :: Rolene Arbour LPN   Activities of Daily Living    05/22/2022    9:21 AM 05/21/2022    9:54 AM  In your present state of health, do you have any difficulty performing the following activities:  Hearing? 0 1  Vision? 0 0  Difficulty concentrating or making decisions? 0 0  Walking or climbing stairs? 0 1  Dressing or bathing? 0 0  Doing errands, shopping? 0 0  Preparing Food and eating ? N N  Using the Toilet? N N  In the past six months, have you accidently leaked urine? N Y  Do you have problems with loss of bowel control? N N  Managing your Medications? N N  Managing your Finances? N N  Housekeeping or managing your Housekeeping? N N    Patient Care Team: Laurey Morale, MD as PCP - Huston Foley, MD as Consulting Physician (General Surgery)  Indicate any recent Medical Services you may have received from other than [facility] providers in the past year (date may be approximate).     Assessment:   This is a routine wellness examination for Ahriyah.  Hearing/Vision screen Hearing Screening - Comments:: Denies hearing difficulties   Vision Screening - Comments:: Wears rx glasses - up to date with routine eye exams with  Dr Celene Squibb  Dietary issues and exercise activities discussed: Exercise limited by: None identified   Goals Addressed               This Visit's Progress     No current goals (pt-stated)         Depression Screen    05/22/2022    9:16 AM 03/05/2022   10:12 AM 02/20/2022    3:03 PM 12/01/2021    4:55 PM 09/10/2021    1:50 PM 07/02/2021   11:08 AM 05/02/2021   12:13 PM  PHQ 2/9 Scores  PHQ - 2 Score 0 0 0 0 0 0 0  PHQ-  9 Score  2 0 0 0 0 0    Fall Risk    05/22/2022    9:21 AM 05/21/2022    9:54 AM 03/05/2022   10:11 AM 02/20/2022    3:02 PM 12/01/2021    4:55 PM  Fall Risk   Falls in the past year? 0 0 0 0 0  Number falls in past yr: 0 0 0 0 0  Injury with Fall? 0 0 0 0 0  Risk for fall due to : No Fall Risks  No Fall  Risks No Fall Risks No Fall Risks  Follow up Falls prevention discussed  Falls evaluation completed Falls evaluation completed Falls evaluation completed    [place]:  Any stairs in or around the home? No If so, are there any without handrails? No  Home free of loose throw rugs in walkways, pet beds, electrical cords, etc? Yes  Adequate lighting in your home to reduce risk of falls? Yes   ASSISTIVE DEVICES UTILIZED TO PREVENT FALLS:  Life alert? No  Use of a cane, walker or w/c? Yes  Grab bars in the bathroom? Yes  Shower chair or bench in shower? Yes  Elevated toilet seat or a handicapped toilet? No   TIMED UP AND GO:  Was the test performed? Yes .  Length of time to ambulate 10 feet: 10 sec.   Gait steady and fast without use of assistive device  Cognitive Function:        05/22/2022    9:22 AM  6CIT Screen  What Year? 0 points  What month? 0 points  What time? 0 points  Count back from 20 0 points  Months in reverse 0 points  Repeat phrase 0 points  Total Score 0 points    Immunizations Immunization History  Administered Date(s) Administered   Influenza Inj Mdck Quad Pf 03/20/2021, 02/20/2022   Influenza-Unspecified 01/21/2017   PFIZER(Purple Top)SARS-COV-2 Vaccination 06/23/2019, 07/15/2019, 02/09/2020, 09/14/2020, 09/09/2021   Pfizer Covid-19 Vaccine Bivalent Booster 29yr & up 02/07/2022   Pneumococcal Conjugate-13 07/24/2014   Pneumococcal Polysaccharide-23 11/16/2017   Rsv, Bivalent, Protein Subunit Rsvpref,pf (Evans Lance 02/07/2022   Tdap 07/24/2014   Zoster Recombinat (Shingrix) 02/07/2022   Zoster, Live 08/15/2014    TDAP status: Up to date  Flu Vaccine status: Up to date  Pneumococcal vaccine status: Up to date  Covid-19 vaccine status: Completed vaccines  Qualifies for Shingles Vaccine? Yes   Zostavax completed Yes   Shingrix Completed?: Yes  Screening Tests Health Maintenance  Topic Date Due   DEXA  SCAN  Never done   COVID-19 Vaccine (7 - 2023-24 season) 06/07/2022 (Originally 04/04/2022)   MAMMOGRAM  03/11/2023   Medicare Annual Wellness (AWV)  05/23/2023   DTaP/Tdap/Td (2 - Td or Tdap) 07/23/2024   COLONOSCOPY (Pts 45-452yrInsurance coverage will need to be confirmed)  08/28/2029   Pneumonia Vaccine 6527Years old  Completed   INFLUENZA VACCINE  Completed   Hepatitis C Screening  Completed   HPV VACCINES  Aged Out   Zoster Vaccines- Shingrix  Discontinued    Health Maintenance  Health Maintenance Due  Topic Date Due   DEXA SCAN  Never done    Colorectal cancer screening: Type of screening: Colonoscopy. Completed 08/29/19. Repeat every 10 years  Mammogram status: Ordered Scheduled for 09/07/22. Pt provided with contact info and advised to call to schedule appt.   Bone Density status: Ordered 05/22/22. Pt provided with contact info and advised to  call to schedule appt.  Lung Cancer Screening: (Low Dose CT Chest recommended if Age 34-80 years, 30 pack-year currently smoking OR have quit w/in 15years.) does qualify.   Lung Cancer Screening Referral: Deferred  Additional Screening:  Hepatitis C Screening: does qualify; Completed 08/04/16  Vision Screening: Recommended annual ophthalmology exams for early detection of glaucoma and other disorders of the eye. Is the patient up to date with their annual eye exam?  Yes  Who is the provider or what is the name of the office in which the patient attends annual eye exams? Dr Celene Squibb If pt is not established with a provider, would they like to be referred to a provider to establish care? No .   Dental Screening: Recommended annual dental exams for proper oral hygiene  Community Resource Referral / Chronic Care Management:  CRR required this visit?  No   CCM required this visit?  No      Plan:     I have personally reviewed and noted the following in the patient's chart:   Medical and social history Use of alcohol, tobacco  or illicit drugs  Current medications and supplements including opioid prescriptions. Patient is currently taking Opioids Functional ability and status Nutritional status Physical activity Advanced directives List of other physicians Hospitalizations, surgeries, and ER visits in previous 12 months Vitals Screenings to include cognitive, depression, and falls Referrals and appointments  In addition, I have reviewed and discussed with patient certain preventive protocols, quality metrics, and best practice recommendations. A written personalized care plan for preventive services as well as general preventive health recommendations were provided to patient.     Criselda Peaches, LPN   [id number]   Nurse Notes: None

## 2022-05-22 NOTE — Patient Instructions (Addendum)
[name] , Thank you for taking time to come for your Medicare Wellness Visit. I appreciate your ongoing commitment to your health goals. Please review the following plan we discussed and let me know if I can assist you in the future.   These are the goals we discussed:  Goals       No current goals (pt-stated)      Patient Stated      I will continue to walk 2 miles per [name]         This is a list of the screening recommended for you and due dates:  Health Maintenance  Topic Date Due   DEXA scan (bone density measurement)  Never done   COVID-19 Vaccine (7 - 2023-24 season) 06/07/2022*   Mammogram  03/11/2023   Medicare Annual Wellness Visit  05/23/2023   DTaP/Tdap/Td vaccine (2 - Td or Tdap) 07/23/2024   Colon Cancer Screening  08/28/2029   Pneumonia Vaccine  Completed   Flu Shot  Completed   Hepatitis C Screening: USPSTF Recommendation to screen - Ages 18-79 yo.  Completed   HPV Vaccine  Aged Out   Zoster (Shingles) Vaccine  Discontinued  *Topic was postponed. The date shown is not the original due date.   Opioid Pain Medicine Management Opioids are powerful medicines that are used to treat moderate to severe pain. When used for short periods of time, they can help you to: Sleep better. Do better in physical or occupational therapy. Feel better in the first few days after an injury. Recover from surgery. Opioids should be taken with the supervision of a trained health care provider. They should be taken for the shortest period of time possible. This is because opioids can be addictive, and the longer you take opioids, the greater your risk of addiction. This addiction can also be called opioid use disorder. What are the risks? Using opioid pain medicines for longer than 3 days increases your risk of side effects. Side effects include: Constipation. Nausea and vomiting. Breathing difficulties (respiratory depression). Drowsiness. Confusion. Opioid use  disorder. Itching. Taking opioid pain medicine for a long period of time can affect your ability to do daily tasks. It also puts you at risk for: Motor vehicle crashes. Depression. Suicide. Heart attack. Overdose, which can be life-threatening. What is a pain treatment plan? A pain treatment plan is an agreement between you and your health care provider. Pain is unique to each person, and treatments vary depending on your condition. To manage your pain, you and your health care provider need to work together. To help you do this: Discuss the goals of your treatment, including how much pain you might expect to have and how you will manage the pain. Review the risks and benefits of taking opioid medicines. Remember that a good treatment plan uses more than one approach and minimizes the chance of side effects. Be honest about the amount of medicines you take and about any drug or alcohol use. Get pain medicine prescriptions from only one health care provider. Pain can be managed with many types of alternative treatments. Ask your health care provider to refer you to one or more specialists who can help you manage pain through: Physical or occupational therapy. Counseling (cognitive behavioral therapy). Good nutrition. Biofeedback. Massage. Meditation. Non-opioid medicine. Following a gentle exercise program. How to use opioid pain medicine Taking medicine Take your pain medicine exactly as told by your health care provider. Take it only when you need it. If your  pain gets less severe, you may take less than your prescribed dose if your health care provider approves. If you are not having pain, do nottake pain medicine unless your health care provider tells you to take it. If your pain is severe, do nottry to treat it yourself by taking more pills than instructed on your prescription. Contact your health care provider for help. Write down the times when you take your pain medicine. It is  easy to become confused while on pain medicine. Writing the time can help you avoid overdose. Take other over-the-counter or prescription medicines only as told by your health care provider. Keeping yourself and others safe  While you are taking opioid pain medicine: Do not drive, use machinery, or power tools. Do not sign legal documents. Do not drink alcohol. Do not take sleeping pills. Do not supervise children by yourself. Do not do activities that require climbing or being in high places. Do not go to a lake, river, ocean, spa, or swimming pool. Do not share your pain medicine with anyone. Keep pain medicine in a locked cabinet or in a secure area where pets and children cannot reach it. Stopping your use of opioids If you have been taking opioid medicine for more than a few weeks, you may need to slowly decrease (taper) how much you take until you stop completely. Tapering your use of opioids can decrease your risk of symptoms of withdrawal, such as: Pain and cramping in the abdomen. Nausea. Sweating. Sleepiness. Restlessness. Uncontrollable shaking (tremors). Cravings for the medicine. Do not attempt to taper your use of opioids on your own. Talk with your health care provider about how to do this. Your health care provider may prescribe a step-down schedule based on how much medicine you are taking and how long you have been taking it. Getting rid of leftover pills Do not save any leftover pills. Get rid of leftover pills safely by: Taking the medicine to a prescription take-back program. This is usually offered by the county or law enforcement. Bringing them to a pharmacy that has a drug disposal container. Flushing them down the toilet. Check the label or package insert of your medicine to see whether this is safe to do. Throwing them out in the trash. Check the label or package insert of your medicine to see whether this is safe to do. If it is safe to throw it out, remove  the medicine from the original container, put it into a sealable bag or container, and mix it with used coffee grounds, food scraps, dirt, or cat litter before putting it in the trash. Follow these instructions at home: Activity Do exercises as told by your health care provider. Avoid activities that make your pain worse. Return to your normal activities as told by your health care provider. Ask your health care provider what activities are safe for you. General instructions You may need to take these actions to prevent or treat constipation: Drink enough fluid to keep your urine pale yellow. Take over-the-counter or prescription medicines. Eat foods that are high in fiber, such as beans, whole grains, and fresh fruits and vegetables. Limit foods that are high in fat and processed sugars, such as fried or sweet foods. Keep all follow-up visits. This is important. Where to find support If you have been taking opioids for a long time, you may benefit from receiving support for quitting from a local support group or counselor. Ask your health care provider for a referral to these  resources in your area. Where to find more information Centers for Disease Control and Prevention (CDC): http://www.wolf.info/ [place]. Food and Drug Administration (FDA): [place].[employer] Get help right away if: You may have taken too much of an opioid (overdosed). Common symptoms of an overdose: Your breathing is slower or more shallow than normal. You have a very slow heartbeat (pulse). You have slurred speech. You have nausea and vomiting. Your pupils become very small. You have other potential symptoms: You are very confused. You faint or feel like you will faint. You have cold, clammy skin. You have blue lips or fingernails. You have thoughts of harming yourself or harming others. These symptoms may represent a serious problem that is an emergency. Do not wait to see if the symptoms will go away. Get medical help right away.  Call your local emergency services (911 in the [place] Do not drive yourself to the hospital.  If you ever feel like you may hurt yourself or others, or have thoughts about taking your own life, get help right away. Go to your nearest emergency department or: Call your local emergency services (911 in the [place] Call the [facility] (973) 199-1918 in the [place] Call a suicide crisis helpline, such as the [place] at 425-124-4390 or 988 in the [place]. This is open 24 hours a [place] in the [place] Text the Crisis Text Line at 276 324 6565 (in the [place].). Summary Opioid medicines can help you manage moderate to severe pain for a short period of time. A pain treatment plan is an agreement between you and your health care provider. Discuss the goals of your treatment, including how much pain you might expect to have and how you will manage the pain. If you think that you or someone else may have taken too much of an opioid, get medical help right away. This information is not intended to replace advice given to you by your health care provider. Make sure you discuss any questions you have with your health care provider. Document Revised: 11/20/2020 Document Reviewed: 08/07/2020 Elsevier Patient Education  [place] directives: In Chart  Conditions/risks identified: None  Next appointment: Follow up in one year for your annual wellness visit     Preventive Care 65 Years and Older, Female Preventive care refers to lifestyle choices and visits with your health care provider that can promote health and wellness. What does preventive care include? A yearly physical exam. This is also called an annual well check. Dental exams once or twice a year. Routine eye exams. Ask your health care provider how often you should have your eyes checked. Personal lifestyle choices, including: Daily care of your teeth and gums. Regular physical  activity. Eating a healthy diet. Avoiding tobacco and drug use. Limiting alcohol use. Practicing safe sex. Taking low-dose aspirin every [place]. Taking vitamin and mineral supplements as recommended by your health care provider. What happens during an annual well check? The services and screenings done by your health care provider during your annual well check will depend on your age, overall health, lifestyle risk factors, and family history of disease. Counseling  Your health care provider may ask you questions about your: Alcohol use. Tobacco use. Drug use. Emotional well-being. Home and relationship well-being. Sexual activity. Eating habits. History of falls. Memory and ability to understand (cognition). Work and work Statistician. Reproductive health. Screening  You may have the following tests or measurements: Height, weight, and BMI. Blood pressure. Lipid and cholesterol levels. These  may be checked every 5 years, or more frequently if you are over 46 years old. Skin check. Lung cancer screening. You may have this screening every year starting at age 67 if you have a 30-pack-year history of smoking and currently smoke or have quit within the past 15 years. Fecal occult blood test (FOBT) of the stool. You may have this test every year starting at age 29. Flexible sigmoidoscopy or colonoscopy. You may have a sigmoidoscopy every 5 years or a colonoscopy every 10 years starting at age 48. Hepatitis C blood test. Hepatitis B blood test. Sexually transmitted disease (STD) testing. Diabetes screening. This is done by checking your blood sugar (glucose) after you have not eaten for a while (fasting). You may have this done every 1-3 years. Bone density scan. This is done to screen for osteoporosis. You may have this done starting at age 55. Mammogram. This may be done every 1-2 years. Talk to your health care provider about how often you should have regular mammograms. Talk with your  health care provider about your test results, treatment options, and if necessary, the need for more tests. Vaccines  Your health care provider may recommend certain vaccines, such as: Influenza vaccine. This is recommended every year. Tetanus, diphtheria, and acellular pertussis (Tdap, Td) vaccine. You may need a Td booster every 10 years. Zoster vaccine. You may need this after age 2. Pneumococcal 13-valent conjugate (PCV13) vaccine. One dose is recommended after age 24. Pneumococcal polysaccharide (PPSV23) vaccine. One dose is recommended after age 63. Talk to your health care provider about which screenings and vaccines you need and how often you need them. This information is not intended to replace advice given to you by your health care provider. Make sure you discuss any questions you have with your health care provider. Document Released: 05/24/2015 Document Revised: 01/15/2016 Document Reviewed: 02/26/2015 Elsevier Interactive Patient Education  2017 [place] Prevention in the Home Falls can cause injuries. They can happen to people of all ages. There are many things you can do to make your home safe and to help prevent falls. What can I do on the outside of my home? Regularly fix the edges of walkways and driveways and fix any cracks. Remove anything that might make you trip as you walk through a door, such as a raised step or threshold. Trim any bushes or trees on the path to your home. Use bright outdoor lighting. Clear any walking paths of anything that might make someone trip, such as rocks or tools. Regularly check to see if handrails are loose or broken. Make sure that both sides of any steps have handrails. Any raised decks and porches should have guardrails on the edges. Have any leaves, snow, or ice cleared regularly. Use sand or salt on walking paths during winter. Clean up any spills in your garage right away. This includes oil or grease spills. What can I  do in the bathroom? Use night lights. Install grab bars by the toilet and in the tub and shower. Do not use towel bars as grab bars. Use non-skid mats or decals in the tub or shower. If you need to sit down in the shower, use a plastic, non-slip stool. Keep the floor dry. Clean up any water that spills on the floor as soon as it happens. Remove soap buildup in the tub or shower regularly. Attach bath mats securely with double-sided non-slip rug tape. Do not have throw rugs and other things on the  floor that can make you trip. What can I do in the bedroom? Use night lights. Make sure that you have a light by your bed that is easy to reach. Do not use any sheets or blankets that are too big for your bed. They should not hang down onto the floor. Have a firm chair that has side arms. You can use this for support while you get dressed. Do not have throw rugs and other things on the floor that can make you trip. What can I do in the kitchen? Clean up any spills right away. Avoid walking on wet floors. Keep items that you use a lot in easy-to-reach places. If you need to reach something above you, use a strong step stool that has a grab bar. Keep electrical cords out of the way. Do not use floor polish or wax that makes floors slippery. If you must use wax, use non-skid floor wax. Do not have throw rugs and other things on the floor that can make you trip. What can I do with my stairs? Do not leave any items on the stairs. Make sure that there are handrails on both sides of the stairs and use them. Fix handrails that are broken or loose. Make sure that handrails are as long as the stairways. Check any carpeting to make sure that it is firmly attached to the stairs. Fix any carpet that is loose or worn. Avoid having throw rugs at the top or bottom of the stairs. If you do have throw rugs, attach them to the floor with carpet tape. Make sure that you have a light switch at the top of the stairs  and the bottom of the stairs. If you do not have them, ask someone to add them for you. What else can I do to help prevent falls? Wear shoes that: Do not have high heels. Have rubber bottoms. Are comfortable and fit you well. Are closed at the toe. Do not wear sandals. If you use a stepladder: Make sure that it is fully opened. Do not climb a closed stepladder. Make sure that both sides of the stepladder are locked into place. Ask someone to hold it for you, if possible. Clearly mark and make sure that you can see: Any grab bars or handrails. First and last steps. Where the edge of each step is. Use tools that help you move around (mobility aids) if they are needed. These include: Canes. Walkers. Scooters. Crutches. Turn on the lights when you go into a dark area. Replace any light bulbs as soon as they burn out. Set up your furniture so you have a clear path. Avoid moving your furniture around. If any of your floors are uneven, fix them. If there are any pets around you, be aware of where they are. Review your medicines with your doctor. Some medicines can make you feel dizzy. This can increase your chance of falling. Ask your doctor what other things that you can do to help prevent falls. This information is not intended to replace advice given to you by your health care provider. Make sure you discuss any questions you have with your health care provider. Document Released: 02/21/2009 Document Revised: 10/03/2015 Document Reviewed: 06/01/2014 Elsevier Interactive Patient Education  2017 [employer].

## 2022-05-28 DIAGNOSIS — D485 Neoplasm of uncertain behavior of skin: Secondary | ICD-10-CM | POA: Diagnosis not present

## 2022-05-28 DIAGNOSIS — L280 Lichen simplex chronicus: Secondary | ICD-10-CM | POA: Diagnosis not present

## 2022-06-10 DIAGNOSIS — L400 Psoriasis vulgaris: Secondary | ICD-10-CM | POA: Diagnosis not present

## 2022-06-16 ENCOUNTER — Ambulatory Visit: Payer: Medicare Other | Admitting: Family Medicine

## 2022-06-16 DIAGNOSIS — L400 Psoriasis vulgaris: Secondary | ICD-10-CM | POA: Diagnosis not present

## 2022-06-24 ENCOUNTER — Other Ambulatory Visit (HOSPITAL_BASED_OUTPATIENT_CLINIC_OR_DEPARTMENT_OTHER): Payer: Self-pay

## 2022-06-24 ENCOUNTER — Ambulatory Visit (INDEPENDENT_AMBULATORY_CARE_PROVIDER_SITE_OTHER): Payer: Medicare Other | Admitting: Family Medicine

## 2022-06-24 ENCOUNTER — Encounter: Payer: Self-pay | Admitting: Family Medicine

## 2022-06-24 VITALS — BP 106/60 | HR 75 | Temp 98.2°F | Wt 201.0 lb

## 2022-06-24 DIAGNOSIS — H6123 Impacted cerumen, bilateral: Secondary | ICD-10-CM | POA: Diagnosis not present

## 2022-06-24 MED ORDER — HYDROCODONE-ACETAMINOPHEN 10-325 MG PO TABS
1.0000 | ORAL_TABLET | Freq: Four times a day (QID) | ORAL | 0 refills | Status: DC | PRN
  Filled 2022-06-24: qty 120, 30d supply, fill #0

## 2022-06-24 NOTE — Progress Notes (Signed)
   Subjective:    Patient ID: Einar Gip Wyre, female    DOB: 11-07-1950, 72 y.o.   MRN: [id number]  HPI Here for muffled hearing in both ears. No pain or sinus congestion.    Review of Systems  Constitutional: Negative.   HENT:  Positive for hearing loss. Negative for congestion, ear pain, postnasal drip, sinus pressure and sore throat.   Eyes: Negative.   Respiratory: Negative.         Objective:   Physical Exam Constitutional:      Appearance: Normal appearance.  HENT:     Right Ear: There is impacted cerumen.     Left Ear: There is impacted cerumen.     Nose: Nose normal.     Mouth/Throat:     Pharynx: Oropharynx is clear.  Eyes:     Conjunctiva/sclera: Conjunctivae normal.  Pulmonary:     Effort: Pulmonary effort is normal.     Breath sounds: Normal breath sounds.  Lymphadenopathy:     Cervical: No cervical adenopathy.  Neurological:     Mental Status: She is alert.           Assessment & Plan:  Cerumen impactions. After informed consent was obtained both ear canals were irrigated clear with water. She tolerated the procedure well. On re-exam the canals were clear.  Alysia Penna, MD

## 2022-07-23 ENCOUNTER — Other Ambulatory Visit (HOSPITAL_BASED_OUTPATIENT_CLINIC_OR_DEPARTMENT_OTHER): Payer: Self-pay

## 2022-07-23 ENCOUNTER — Ambulatory Visit (INDEPENDENT_AMBULATORY_CARE_PROVIDER_SITE_OTHER): Payer: Medicare Other | Admitting: Family Medicine

## 2022-07-23 ENCOUNTER — Encounter: Payer: Self-pay | Admitting: Family Medicine

## 2022-07-23 VITALS — BP 118/70 | HR 72 | Temp 98.1°F | Wt 197.6 lb

## 2022-07-23 DIAGNOSIS — G8929 Other chronic pain: Secondary | ICD-10-CM

## 2022-07-23 DIAGNOSIS — F1190 Opioid use, unspecified, uncomplicated: Secondary | ICD-10-CM

## 2022-07-23 DIAGNOSIS — M542 Cervicalgia: Secondary | ICD-10-CM | POA: Diagnosis not present

## 2022-07-23 MED ORDER — HYDROCODONE-ACETAMINOPHEN 10-325 MG PO TABS
1.0000 | ORAL_TABLET | Freq: Four times a day (QID) | ORAL | 0 refills | Status: DC | PRN
  Filled 2022-07-24: qty 120, 30d supply, fill #0

## 2022-07-23 NOTE — Progress Notes (Signed)
   Subjective:    Patient ID: [name], female    DOB: 1950-08-14, 72 y.o.   MRN: [id number]  HPI Here for pain management. She has been having more pain than usual in the left side of her neck for the past month. The pain does not radiate down the left arm, but the arm does have numbness and tingling at times.    Review of Systems  Constitutional: Negative.   Musculoskeletal:  Positive for neck pain.  Neurological:  Positive for numbness.       Objective:   Physical Exam Constitutional:      Appearance: Normal appearance.  Musculoskeletal:     Comments: She is tender along the left side of the neck, and ROM is limited by pain   Neurological:     Mental Status: She is alert.           Assessment & Plan:  Pain management.  Indication for chronic opioid: neck pain Medication and dose: Norco 10-325 # pills per month: 120 Last UDS date: 09-11-21 Opioid Treatment Agreement signed (Y/N): 06-03-18 Opioid Treatment Agreement last reviewed with patient:  07-23-22 [facility] reviewed this encounter (include red flags): Yes Meds were refilled. We will also refer her back to see Dr. Frederich Cha for the neck pain (he did the fusion surgery in 2008).  Alysia Penna, MD

## 2022-07-24 ENCOUNTER — Other Ambulatory Visit (HOSPITAL_BASED_OUTPATIENT_CLINIC_OR_DEPARTMENT_OTHER): Payer: Self-pay

## 2022-07-24 ENCOUNTER — Telehealth: Payer: Medicare Other | Admitting: Family Medicine

## 2022-07-24 ENCOUNTER — Ambulatory Visit: Payer: Medicare Other | Admitting: Family Medicine

## 2022-07-27 ENCOUNTER — Other Ambulatory Visit (HOSPITAL_BASED_OUTPATIENT_CLINIC_OR_DEPARTMENT_OTHER): Payer: Self-pay

## 2022-07-28 ENCOUNTER — Encounter: Payer: Medicare Other | Admitting: Family Medicine

## 2022-07-29 ENCOUNTER — Encounter: Payer: Self-pay | Admitting: Family Medicine

## 2022-07-29 ENCOUNTER — Ambulatory Visit (INDEPENDENT_AMBULATORY_CARE_PROVIDER_SITE_OTHER): Payer: Medicare Other | Admitting: Family Medicine

## 2022-07-29 VITALS — BP 104/68 | HR 59 | Temp 97.9°F | Ht 64.0 in | Wt 197.0 lb

## 2022-07-29 DIAGNOSIS — L400 Psoriasis vulgaris: Secondary | ICD-10-CM | POA: Diagnosis not present

## 2022-07-29 DIAGNOSIS — R739 Hyperglycemia, unspecified: Secondary | ICD-10-CM

## 2022-07-29 DIAGNOSIS — M109 Gout, unspecified: Secondary | ICD-10-CM | POA: Insufficient documentation

## 2022-07-29 DIAGNOSIS — G8929 Other chronic pain: Secondary | ICD-10-CM | POA: Diagnosis not present

## 2022-07-29 DIAGNOSIS — I10 Essential (primary) hypertension: Secondary | ICD-10-CM

## 2022-07-29 DIAGNOSIS — M542 Cervicalgia: Secondary | ICD-10-CM | POA: Diagnosis not present

## 2022-07-29 DIAGNOSIS — K582 Mixed irritable bowel syndrome: Secondary | ICD-10-CM

## 2022-07-29 DIAGNOSIS — M1A9XX0 Chronic gout, unspecified, without tophus (tophi): Secondary | ICD-10-CM | POA: Diagnosis not present

## 2022-07-29 DIAGNOSIS — M5430 Sciatica, unspecified side: Secondary | ICD-10-CM

## 2022-07-29 DIAGNOSIS — M5030 Other cervical disc degeneration, unspecified cervical region: Secondary | ICD-10-CM

## 2022-07-29 LAB — BASIC METABOLIC PANEL
BUN: 16 mg/dL (ref 6–23)
CO2: 26 mEq/L (ref 19–32)
Calcium: 9.3 mg/dL (ref 8.4–10.5)
Chloride: 98 mEq/L (ref 96–112)
Creatinine, Ser: 0.77 mg/dL (ref 0.40–1.20)
GFR: 78.28 mL/min (ref >60.00)
Glucose, Bld: 86 mg/dL (ref 70–99)
Potassium: 3.7 mEq/L (ref 3.5–5.1)
Sodium: 134 mEq/L — ABNORMAL LOW (ref 135–145)

## 2022-07-29 LAB — LIPID PANEL
Cholesterol: 155 mg/dL (ref 0–200)
HDL: 41.6 mg/dL (ref >39.00)
LDL Cholesterol: 91 mg/dL (ref 0–99)
NonHDL: 113.23
Total CHOL/HDL Ratio: 4
Triglycerides: 110 mg/dL (ref 0.0–149.0)
VLDL: 22 mg/dL (ref 0.0–40.0)

## 2022-07-29 LAB — HEPATIC FUNCTION PANEL
ALT: 10 U/L (ref 0–35)
AST: 15 U/L (ref 0–37)
Albumin: 3.9 g/dL (ref 3.5–5.2)
Alkaline Phosphatase: 109 U/L (ref 39–117)
Bilirubin, Direct: 0.1 mg/dL (ref 0.0–0.3)
Total Bilirubin: 0.4 mg/dL (ref 0.2–1.2)
Total Protein: 8.2 g/dL (ref 6.0–8.3)

## 2022-07-29 LAB — CBC WITH DIFFERENTIAL/PLATELET
Basophils Absolute: 0 10*3/uL (ref 0.0–0.1)
Basophils Relative: 0.3 % (ref 0.0–3.0)
Eosinophils Absolute: 0.2 10*3/uL (ref 0.0–0.7)
Eosinophils Relative: 1.9 % (ref 0.0–5.0)
HCT: 40.8 % (ref 36.0–46.0)
Hemoglobin: 13 g/dL (ref 12.0–15.0)
Lymphocytes Relative: 25.8 % (ref 12.0–46.0)
Lymphs Abs: 3.3 10*3/uL (ref 0.7–4.0)
MCHC: 32 g/dL (ref 30.0–36.0)
MCV: 80.9 fl (ref 78.0–100.0)
Monocytes Absolute: 0.9 10*3/uL (ref 0.1–1.0)
Monocytes Relative: 7.3 % (ref 3.0–12.0)
Neutro Abs: 8.2 10*3/uL — ABNORMAL HIGH (ref 1.4–7.7)
Neutrophils Relative %: 64.7 % (ref 43.0–77.0)
Platelets: 336 10*3/uL (ref 150.0–400.0)
RBC: 5.04 Mil/uL (ref 3.87–5.11)
RDW: 14.6 % (ref 11.5–15.5)
WBC: 12.6 10*3/uL — ABNORMAL HIGH (ref 4.0–10.5)

## 2022-07-29 LAB — HEMOGLOBIN A1C: Hgb A1c MFr Bld: 6.9 % — ABNORMAL HIGH (ref 4.6–6.5)

## 2022-07-29 LAB — URIC ACID: Uric Acid, Serum: 8.4 mg/dL — ABNORMAL HIGH (ref 2.4–7.0)

## 2022-07-29 LAB — TSH: TSH: 1.03 u[IU]/mL (ref 0.35–5.50)

## 2022-07-29 MED ORDER — METOPROLOL SUCCINATE ER 100 MG PO TB24: ORAL_TABLET | ORAL | 3 refills | Status: DC

## 2022-07-29 MED ORDER — HYDROCHLOROTHIAZIDE 25 MG PO TABS: 25.0000 mg | ORAL_TABLET | Freq: Every day | ORAL | 3 refills | Status: DC

## 2022-07-29 NOTE — Progress Notes (Signed)
Subjective:    Patient ID: [name], female    DOB: 12/06/1950, 72 y.o.   MRN: [id number]  HPI Here to follow up on issues. She is doing well in general. She is in a pain management program with [place] for her chronic neck pain. Her OA is stable. She is working with a Orthoptist to start on [place] for the psoriasis. Her BP is stable. She has not had a gout flare in years.    Review of Systems  Constitutional: Negative.   HENT: Negative.    Eyes: Negative.   Respiratory: Negative.    Cardiovascular: Negative.   Gastrointestinal: Negative.   Genitourinary:  Negative for decreased urine volume, difficulty urinating, dyspareunia, dysuria, enuresis, flank pain, frequency, hematuria, pelvic pain and urgency.  Musculoskeletal:  Positive for arthralgias, neck pain and neck stiffness.  Skin:  Positive for rash.  Neurological: Negative.  Negative for headaches.  Psychiatric/Behavioral: Negative.         Objective:   Physical Exam Constitutional:      General: She is not in acute distress.    Appearance: Normal appearance. She is well-developed.  HENT:     Head: Normocephalic and atraumatic.     Right Ear: External ear normal.     Left Ear: External ear normal.     Nose: Nose normal.     Mouth/Throat:     Pharynx: No oropharyngeal exudate.  Eyes:     General: No scleral icterus.    Conjunctiva/sclera: Conjunctivae normal.     Pupils: Pupils are equal, round, and reactive to light.  Neck:     Thyroid: No thyromegaly.     Vascular: No JVD.  Cardiovascular:     Rate and Rhythm: Normal rate and regular rhythm.     Heart sounds: Normal heart sounds. No murmur heard.    No friction rub. No gallop.  Pulmonary:     Effort: Pulmonary effort is normal. No respiratory distress.     Breath sounds: Normal breath sounds. No wheezing or rales.  Chest:     Chest wall: No tenderness.  Abdominal:     General: Bowel sounds are normal. There is no distension.     Palpations:  Abdomen is soft. There is no mass.     Tenderness: There is no abdominal tenderness. There is no guarding or rebound.  Musculoskeletal:        General: No tenderness. Normal range of motion.     Cervical back: Normal range of motion and neck supple.  Lymphadenopathy:     Cervical: No cervical adenopathy.  Skin:    General: Skin is warm and dry.     Findings: No erythema or rash.  Neurological:     Mental Status: She is alert and oriented to person, place, and time.     Cranial Nerves: No cranial nerve deficit.     Motor: No abnormal muscle tone.     Coordination: Coordination normal.     Deep Tendon Reflexes: Reflexes are normal and symmetric. Reflexes normal.  Psychiatric:        Behavior: Behavior normal.        Thought Content: Thought content normal.        Judgment: Judgment normal.           Assessment & Plan:  Her neck pain and gout are well controlled. Her gout is stable, and we will check a uric acid level. She is working on getting the [employer] for psoriasis. Her HTN  is stable. Get fasting labs for lipids, etc. We spent a total of ( 34  ) minutes reviewing records and discussing these issues.  Alysia Penna, MD

## 2022-08-16 ENCOUNTER — Ambulatory Visit (HOSPITAL_COMMUNITY)
Admission: EM | Admit: 2022-08-16 | Discharge: 2022-08-16 | Disposition: A | Discharge status: Home / Self Care | Payer: Medicare Other | Attending: Family Medicine | Admitting: Family Medicine

## 2022-08-16 ENCOUNTER — Encounter (HOSPITAL_COMMUNITY): Payer: Self-pay

## 2022-08-16 DIAGNOSIS — U071 COVID-19: Secondary | ICD-10-CM | POA: Diagnosis not present

## 2022-08-16 NOTE — ED Provider Notes (Signed)
[facility]    CSN: [id number] Arrival date & time: 08/16/22  1023      History   Chief Complaint Chief Complaint  Patient presents with   Covid Positive    HPI [name] is a 72 y.o. female.   HPI Here for nasal congestion.  Symptoms began on April 3.  She did a home COVID test yesterday and it was positive.  Symptoms have actually improved, and she is not having any congestion or cough now.  She does not have any shortness of breath, myalgia, or malaise.  No fever and no chills.  "I feel just fine"  She is up-to-date on COVID vaccines and did have the COVID booster done in the last 6 months.  She takes hydrocodone 4 times a Pursifull, hydrochlorothiazide, and metoprolol.  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/20/2020   Dyspnea    History of colostomy reversal 12/07/2019   Hypertension    Large bowel obstruction 04/13/2019   Neuromuscular disorder    nerve pain in neck   Neuropathy    Urinary incontinence    Wears contact lenses    Wears dentures     Patient Active Problem List   Diagnosis Date Noted   Gout 07/29/2022   Plaque psoriasis 07/29/2022   COVID-19 virus infection 11/22/2020   Neoplasm of uncertain behavior of skin 10/25/2020   Seborrheic dermatitis 10/25/2020   Incarcerated incisional hernia 10/25/2020   Ventral hernia 10/25/2020   Obesity (BMI 30-39.9) 08/20/2020   Sciatic neuralgia 08/20/2020   Chronic neck pain 08/20/2020   Chronic narcotic dependence 08/20/2020   History of colonic diverticulitis 08/20/2020   Irritable bowel syndrome with both constipation and diarrhea 06/11/2017   Tobacco abuse 05/03/2012   HTN (hypertension) 12/09/2011   INFLAMED SEBORRHEIC KERATOSIS 04/29/2010   DEGENERATIVE DISC DISEASE, 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 Tressie Stalker   COLECTOMY WITH COLOSTOMY CREATION/HARTMANN PROCEDURE N/A 04/14/2019   Procedure: SIGMOID COLECTOMY, RESECTION OF SMALL BOWEL,WITH END COLOSTOMY, PROCTOSCOPY RIGID;  Surgeon: Ovidio Kin, MD;  Location: [facility];  Service: General;  Laterality: N/A;   COLONOSCOPY  08/29/2019   per Dr. Rhea Belton, diverticulosis and one benign polyp, repeat in 10  yrs    COLOSTOMY TAKEDOWN N/A 12/07/2019   Procedure: LAPAROSCOPIC COLOSTOMY REVERSAL;  Surgeon: Ovidio Kin, MD;  [facility];  Service: General;  Laterality: N/A;   CYSTOSCOPY WITH STENT PLACEMENT Bilateral 04/14/2019   Procedure: CYSTOSCOPY WITH BILATERAL STENT PLACEMENT;  Surgeon: Noel Christmas, MD;  Location: [facility];  Service: Urology;  Laterality: Bilateral;   DILATION AND CURETTAGE OF UTERUS     EAR CYST EXCISION Right 07/05/2014   Procedure: EXCISION FLEXOR SHEATH CYST WITH RELEASE A-1 PULLEY RIGHT SMALL FINGER;  Surgeon: Betha Loa, MD;  Location: [facility];  Service: Orthopedics;  Laterality: Right;   EYE SURGERY Bilateral    cataract surgery   INSERTION OF MESH N/A 10/25/2020   Procedure: INSERTION OF MESH;  Surgeon: Karie Soda, MD;  [facility];  Service: General;  Laterality: N/A;   KNEE SURGERY Right    LYSIS OF ADHESION N/A 10/25/2020  Procedure: LYSIS OF ADHESION;  Surgeon: Karie SodaGross, Steven, MD;  [facility];  Service: General;  Laterality: N/A;   SPINE SURGERY     STOMACH SURGERY     TRIGGER FINGER RELEASE Right 07/05/2014   Procedure: RELEASE RIGHT SMALL FINGER/A-1 PULLEY;  Surgeon: Betha LoaKevin Kuzma, MD;  Location: [facility];  Service: Orthopedics;  Laterality: Right;   TUBOPLASTY / TUBOTUBAL ANASTOMOSIS     VENTRAL HERNIA REPAIR N/A 10/25/2020   Procedure: LAPAROSCOPIC VENTRAL WALL HERNIA REPAIR;  Surgeon: Karie SodaGross, Steven,  MD;  [facility];  Service: General;  Laterality: N/A;    OB History   No obstetric history on file.      Home Medications    Prior to Admission medications   Medication Sig Start Date End Date Taking? Authorizing Provider  hydrochlorothiazide (HYDRODIURIL) 25 MG tablet Take 1 tablet (25 mg total) by mouth daily. 07/29/22  Yes Nelwyn SalisburyFry, Stephen A, MD  HYDROcodone-acetaminophen (NORCO) 10-325 MG tablet Take 1 tablet by mouth every 6 (six) hours as needed for moderate pain. 07/24/22 08/27/22 Yes Nelwyn SalisburyFry, Stephen A, MD  metoprolol succinate (TOPROL-XL) 100 MG 24 hr tablet TAKE 1 TABLET BY MOUTH IN THE MORNING AND AT BEDTIME EVERY Nauman 07/29/22  Yes Nelwyn SalisburyFry, Stephen A, MD  allopurinol (ZYLOPRIM) 300 MG tablet Take 1 tablet (300 mg total) by mouth daily. 03/06/22   Nelwyn SalisburyFry, Stephen A, MD  Ascorbic Acid (VITAMIN C PO) Take 1,000 mg by mouth daily. Ester C    [provider]  Fluocinolone Acetonide 0.01 % OIL 5 drops into affected ear Otic Twice a Feild for 7 days    [provider]  Multiple Vitamins-Minerals (MULTIVITAMIN WITH MINERALS) tablet Take 1 tablet by mouth daily.    [provider]  SKYRIZI PEN 150 MG/ML SOAJ Inject into the skin. 07/21/22   [provider]    Family History Family History  Problem Relation Age of Onset   Arthritis Other    Breast cancer Other    Hypertension Other    Breast cancer Mother    Colon cancer Neg Hx    Colon polyps Neg Hx    Esophageal cancer Neg Hx    Stomach cancer Neg Hx    Rectal cancer Neg Hx     Social History Social History   Tobacco Use   Smoking status: Former    Packs/Yepiz: .5    Types: Cigarettes    Quit date: 06/02/2019    Years since quitting: 3.2   Smokeless tobacco: Never   Tobacco comments:    3 weeks ago  Vaping Use   Vaping Use: Never used  Substance Use Topics   Alcohol use: No    Alcohol/week: 0.0 standard drinks of alcohol   Drug use: Never     Allergies   Lisinopril, Penicillins, and  Adhesive [tape]   Review of Systems Review of Systems   Physical Exam Triage Vital Signs ED Triage Vitals  Enc Vitals Group     BP 08/16/22 1100 121/73     Pulse Rate 08/16/22 1100 70     Resp 08/16/22 1100 20     Temp 08/16/22 1100 97.9 F (36.6 C)     Temp Source 08/16/22 1100 Oral     SpO2 08/16/22 1100 94 %     Weight 08/16/22 1056 192 lb (87.1 kg)     Height 08/16/22 1056 5\' 5"  (1.651 m)     Head Circumference --      Peak Flow --  Pain Score 08/16/22 1056 0     Pain Loc --      Pain Edu? --      Excl. in GC? --    No data found.  Updated Vital Signs BP 121/73 (BP Location: Left Arm)   Pulse 70   Temp 97.9 F (36.6 C) (Oral)   Resp 20   Ht 5\' 5"  (1.651 m)   Wt 87.1 kg   SpO2 94%   BMI 31.95 kg/m   Visual Acuity Right Eye Distance:   Left Eye Distance:   Bilateral Distance:    Right Eye Near:   Left Eye Near:    Bilateral Near:     Physical Exam Vitals reviewed.  Constitutional:      General: She is not in acute distress.    Appearance: She is not toxic-appearing.  HENT:     Nose: Nose normal.     Mouth/Throat:     Mouth: Mucous membranes are moist.     Pharynx: No oropharyngeal exudate or posterior oropharyngeal erythema.  Eyes:     Extraocular Movements: Extraocular movements intact.     Conjunctiva/sclera: Conjunctivae normal.     Pupils: Pupils are equal, round, and reactive to light.  Cardiovascular:     Rate and Rhythm: Normal rate and regular rhythm.     Heart sounds: No murmur heard. Pulmonary:     Effort: Pulmonary effort is normal. No respiratory distress.     Breath sounds: No stridor. No wheezing, rhonchi or rales.  Musculoskeletal:     Cervical back: Neck supple.  Lymphadenopathy:     Cervical: No cervical adenopathy.  Skin:    Capillary Refill: Capillary refill takes less than 2 seconds.     Coloration: Skin is not jaundiced or pale.  Neurological:     General: No focal deficit present.     Mental Status: She is  alert and oriented to person, place, and time.  Psychiatric:        Behavior: Behavior normal.      UC Treatments / Results  Labs (all labs ordered are listed, but only abnormal results are displayed) Labs Reviewed - No data to display  EKG   Radiology No results found.  Procedures Procedures (including critical care time)  Medications Ordered in UC Medications - No data to display  Initial Impression / Assessment and Plan / UC Course  I have reviewed the triage vital signs and the nursing notes.  Pertinent labs & imaging results that were available during my care of the patient were reviewed by me and considered in my medical decision making (see chart for details).        There is a significant interaction between her hydrocodone that she takes 4 times a Neider and Paxlovid.  Since her clinical picture is that she is already improving clinically from when her symptoms started, and she takes a medication that has a interaction with Paxlovid, and she cannot stop the hydrocodone, then I think it best not to prescribe Paxlovid.  We have discussed the Paxlovid, the reasons to take it or not to take it, and with the fact that she is already improving, we decided against that prescription.  Discussed isolation timing with the current recommendations. Final Clinical Impressions(s) / UC Diagnoses   Final diagnoses:  COVID     Discharge Instructions      Since you are already feeling better from this infection, I do not think you need an antiviral prescription.  Another reason  not to take the antiviral prescription is that the paxlovid interacts significantly with your hydrocodone you take for pain.   You should quarantine until you are fever free for 24 hours and you are starting to feel better, and then take added precautions for the next 5 days, such as physical distancing/wearing a mask and good hand hygiene/washing.      ED Prescriptions   None    I have reviewed  the PDMP during this encounter.   Zenia Resides, MD 08/16/22 1141

## 2022-08-16 NOTE — ED Triage Notes (Signed)
I have been congested a couple of days, took a covid19 test at home and positive yesterday. Symptoms originally started "Wednesday and just a stuffy nose". No fever. No Cough. No sob.

## 2022-08-16 NOTE — Discharge Instructions (Addendum)
Since you are already feeling better from this infection, I do not think you need an antiviral prescription.  Another reason not to take the antiviral prescription is that the paxlovid interacts significantly with your hydrocodone you take for pain.   You should quarantine until you are fever free for 24 hours and you are starting to feel better, and then take added precautions for the next 5 days, such as physical distancing/wearing a mask and good hand hygiene/washing.

## 2022-08-26 ENCOUNTER — Other Ambulatory Visit: Payer: Self-pay | Admitting: Family Medicine

## 2022-08-27 ENCOUNTER — Encounter: Payer: Self-pay | Admitting: Family Medicine

## 2022-08-28 ENCOUNTER — Other Ambulatory Visit (HOSPITAL_BASED_OUTPATIENT_CLINIC_OR_DEPARTMENT_OTHER): Payer: Self-pay

## 2022-08-28 MED ORDER — HYDROCODONE-ACETAMINOPHEN 10-325 MG PO TABS
1.0000 | ORAL_TABLET | Freq: Four times a day (QID) | ORAL | 0 refills | Status: DC | PRN
  Filled 2022-08-28: qty 120, 30d supply, fill #0

## 2022-08-31 ENCOUNTER — Other Ambulatory Visit (HOSPITAL_BASED_OUTPATIENT_CLINIC_OR_DEPARTMENT_OTHER): Payer: Self-pay

## 2022-08-31 MED ORDER — HYDROCODONE-ACETAMINOPHEN 10-325 MG PO TABS
1.0000 | ORAL_TABLET | Freq: Four times a day (QID) | ORAL | 0 refills | Status: DC | PRN
  Filled 2022-08-31: qty 120, 30d supply, fill #0

## 2022-08-31 NOTE — Telephone Encounter (Signed)
I refilled the Norco

## 2022-09-07 ENCOUNTER — Other Ambulatory Visit: Payer: Self-pay | Admitting: Family Medicine

## 2022-09-07 ENCOUNTER — Ambulatory Visit
Admission: RE | Admit: 2022-09-07 | Discharge: 2022-09-07 | Disposition: A | Discharge status: Home / Self Care | Payer: Medicare Other | Source: Ambulatory Visit | Attending: Family Medicine | Admitting: Family Medicine

## 2022-09-07 DIAGNOSIS — R921 Mammographic calcification found on diagnostic imaging of breast: Secondary | ICD-10-CM | POA: Diagnosis not present

## 2022-09-07 DIAGNOSIS — R928 Other abnormal and inconclusive findings on diagnostic imaging of breast: Secondary | ICD-10-CM

## 2022-09-28 ENCOUNTER — Telehealth (INDEPENDENT_AMBULATORY_CARE_PROVIDER_SITE_OTHER): Payer: Medicare Other | Admitting: Family Medicine

## 2022-09-28 ENCOUNTER — Other Ambulatory Visit (HOSPITAL_BASED_OUTPATIENT_CLINIC_OR_DEPARTMENT_OTHER): Payer: Self-pay

## 2022-09-28 ENCOUNTER — Encounter: Payer: Self-pay | Admitting: Family Medicine

## 2022-09-28 DIAGNOSIS — M542 Cervicalgia: Secondary | ICD-10-CM

## 2022-09-28 DIAGNOSIS — G8929 Other chronic pain: Secondary | ICD-10-CM | POA: Diagnosis not present

## 2022-09-28 DIAGNOSIS — F1190 Opioid use, unspecified, uncomplicated: Secondary | ICD-10-CM

## 2022-09-28 MED ORDER — HYDROCODONE-ACETAMINOPHEN 10-325 MG PO TABS
1.0000 | ORAL_TABLET | Freq: Four times a day (QID) | ORAL | 0 refills | Status: DC | PRN
  Filled ????-??-??: fill #0

## 2022-09-28 NOTE — Progress Notes (Signed)
Subjective:    Patient ID: [name], female    DOB: [birth year], 72 y.o.   MRN: [id number]  HPI Virtual Visit via Video Note  I connected with the patient on 09/28/22 at  9:30 AM EDT by a video enabled telemedicine application and verified that I am speaking with the correct person using two identifiers.  Location patient: home Location provider:work or home office Persons participating in the virtual visit: patient, provider  I discussed the limitations of evaluation and management by telemedicine and the availability of in person appointments. The patient expressed understanding and agreed to proceed.   HPI: Here for pain management, she is doing well.    ROS: See pertinent positives and negatives per HPI.  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 Tressie Stalker   COLECTOMY WITH COLOSTOMY CREATION/HARTMANN PROCEDURE N/A 04/14/2019   Procedure: SIGMOID COLECTOMY, RESECTION OF SMALL BOWEL,WITH END COLOSTOMY, PROCTOSCOPY RIGID;  Surgeon: Ovidio Kin, MD;  [facility];  Service: General;  Laterality: N/A;   COLONOSCOPY  08/29/2019   per Dr. Rhea Belton, diverticulosis and one benign polyp, repeat in 10  yrs    COLOSTOMY TAKEDOWN N/A 12/07/2019   Procedure: LAPAROSCOPIC COLOSTOMY REVERSAL;  Surgeon: Ovidio Kin, MD;  [facility];  Service: General;  Laterality: N/A;   CYSTOSCOPY WITH STENT PLACEMENT Bilateral 04/14/2019   Procedure:  CYSTOSCOPY WITH BILATERAL STENT PLACEMENT;  Surgeon: Noel Christmas, MD;  Location: [facility];  Service: Urology;  Laterality: Bilateral;   DILATION AND CURETTAGE OF UTERUS     EAR CYST EXCISION Right 07/05/2014   Procedure: EXCISION FLEXOR SHEATH CYST WITH RELEASE A-1 PULLEY RIGHT SMALL FINGER;  Surgeon: Betha Loa, MD;  Location: [facility];  Service: Orthopedics;  Laterality: Right;   EYE SURGERY Bilateral    cataract surgery   INSERTION OF MESH N/A 10/25/2020   Procedure: INSERTION OF MESH;  Surgeon: Karie Soda, MD;  [facility];  Service: General;  Laterality: N/A;   KNEE SURGERY Right    LYSIS OF ADHESION N/A 10/25/2020   Procedure: LYSIS OF ADHESION;  Surgeon: Karie Soda, MD;  [facility];  Service: General;  Laterality: N/A;   SPINE SURGERY     STOMACH SURGERY     TRIGGER FINGER RELEASE Right 07/05/2014   Procedure: RELEASE RIGHT SMALL FINGER/A-1 PULLEY;  Surgeon: Betha Loa, MD;  Location: [facility];  Service: Orthopedics;  Laterality: Right;   TUBOPLASTY / TUBOTUBAL ANASTOMOSIS     VENTRAL HERNIA REPAIR N/A 10/25/2020   Procedure: LAPAROSCOPIC VENTRAL WALL HERNIA REPAIR;  Surgeon: Karie Soda, MD;  [facility];  Service: General;  Laterality: N/A;    Family History  Problem Relation Age of Onset   Arthritis Other    Breast cancer Other    Hypertension Other    Breast cancer Mother  Colon cancer Neg Hx    Colon polyps Neg Hx    Esophageal cancer Neg Hx    Stomach cancer Neg Hx    Rectal cancer Neg Hx      Current Outpatient Medications:    Ascorbic Acid (VITAMIN C PO), Take 1,000 mg by mouth daily. Ester C, Disp: , Rfl:    Fluocinolone Acetonide 0.01 % OIL, 5 drops into affected ear Otic Twice a Milliner for 7 days, Disp: , Rfl:    hydrochlorothiazide (HYDRODIURIL) 25 MG tablet, Take 1 tablet (25 mg total) by mouth daily., Disp: 90 tablet, Rfl: 3   HYDROcodone-acetaminophen (NORCO) 10-325 MG tablet, Take 1 tablet by  mouth every 6 (six) hours as needed for moderate pain., Disp: 120 tablet, Rfl: 0   metoprolol succinate (TOPROL-XL) 100 MG 24 hr tablet, TAKE 1 TABLET BY MOUTH IN THE MORNING AND AT BEDTIME EVERY Hanlon, Disp: 180 tablet, Rfl: 3   Multiple Vitamins-Minerals (MULTIVITAMIN WITH MINERALS) tablet, Take 1 tablet by mouth daily., Disp: , Rfl:    SKYRIZI PEN 150 MG/ML SOAJ, Inject into the skin., Disp: , Rfl:    allopurinol (ZYLOPRIM) 300 MG tablet, Take 1 tablet (300 mg total) by mouth daily. (Patient not taking: Reported on 09/28/2022), Disp: 90 tablet, Rfl: 3  EXAM:  VITALS per patient if applicable:  GENERAL: alert, oriented, appears well and in no acute distress  HEENT: atraumatic, conjunttiva clear, no obvious abnormalities on inspection of external nose and ears  NECK: normal movements of the head and neck  LUNGS: on inspection no signs of respiratory distress, breathing rate appears normal, no obvious gross SOB, gasping or wheezing  CV: no obvious cyanosis  MS: moves all visible extremities without noticeable abnormality  PSYCH/NEURO: pleasant and cooperative, no obvious depression or anxiety, speech and thought processing grossly intact  ASSESSMENT AND PLAN: Pain management. Indication for chronic opioid: neck pain Medication and dose: Norco 10-325 # pills per month: 120 Last UDS date: 09-11-21 Opioid Treatment Agreement signed (Y/N): 06-03-18 Opioid Treatment Agreement last reviewed with patient:  09-28-22 NCCSRS reviewed this encounter (include red flags): Yes Meds were refilled.  Gershon Crane, MD  Discussed the following assessment and plan:  No diagnosis found.     I discussed the assessment and treatment plan with the patient. The patient was provided an opportunity to ask questions and all were answered. The patient agreed with the plan and demonstrated an understanding of the instructions.   The patient was advised to call back or seek an in-person evaluation if the  symptoms worsen or if the condition fails to improve as anticipated.      Review of Systems     Objective:   Physical Exam        Assessment & Plan:

## 2022-10-06 ENCOUNTER — Other Ambulatory Visit (HOSPITAL_BASED_OUTPATIENT_CLINIC_OR_DEPARTMENT_OTHER): Payer: Self-pay

## 2022-10-06 ENCOUNTER — Other Ambulatory Visit (HOSPITAL_COMMUNITY): Payer: Self-pay

## 2022-10-07 ENCOUNTER — Other Ambulatory Visit: Payer: Self-pay | Admitting: Family Medicine

## 2022-10-07 ENCOUNTER — Other Ambulatory Visit: Payer: Self-pay | Admitting: Internal Medicine

## 2022-10-07 ENCOUNTER — Telehealth: Payer: Self-pay

## 2022-10-07 ENCOUNTER — Other Ambulatory Visit (HOSPITAL_BASED_OUTPATIENT_CLINIC_OR_DEPARTMENT_OTHER): Payer: Self-pay

## 2022-10-07 ENCOUNTER — Encounter: Payer: Self-pay | Admitting: Family Medicine

## 2022-10-07 DIAGNOSIS — F1120 Opioid dependence, uncomplicated: Secondary | ICD-10-CM

## 2022-10-07 DIAGNOSIS — G8929 Other chronic pain: Secondary | ICD-10-CM

## 2022-10-07 MED ORDER — HYDROCODONE-ACETAMINOPHEN 10-325 MG PO TABS
1.0000 | ORAL_TABLET | Freq: Four times a day (QID) | ORAL | 0 refills | Status: DC | PRN
Start: 2022-11-30 — End: 2022-10-08

## 2022-10-07 NOTE — Telephone Encounter (Signed)
Left a message for pt with details about Hydrocodone refill that was sent in today by Dr Ardyth Harps

## 2022-10-07 NOTE — Telephone Encounter (Signed)
Regarding script for HYDROcodone-acetaminophen (NORCO) 10-325 MG tablet . Says one of the prescripitions cancelled out the other and they can't fill until July.

## 2022-10-07 NOTE — Telephone Encounter (Signed)
Message sent to Dr Ardyth Harps for advise

## 2022-10-07 NOTE — Telephone Encounter (Signed)
Spoke with pt pharmacy this morning state that they received a refill for HYDROcodone-acetaminophen (NORCO) 10-325 MG tablet form Dr Clent Ridges but it was dated for 11/30/22. Pt is out of medication and needs refill sent in for the month of June, pt PCP is unavailable, please advise

## 2022-10-07 NOTE — Telephone Encounter (Signed)
Pt called back to say she is completely out of this Rx, and it was supposed to be filled on or around 09/30/22.  Pt states she cannot wait until July for this refill.   Pt is asking that MD/CMA make the correction so that she can pick up this Rx refill today or tomorrow, as she is completely out.  Please advise.

## 2022-10-08 ENCOUNTER — Other Ambulatory Visit: Payer: Self-pay | Admitting: Family Medicine

## 2022-10-08 ENCOUNTER — Other Ambulatory Visit (HOSPITAL_BASED_OUTPATIENT_CLINIC_OR_DEPARTMENT_OTHER): Payer: Self-pay

## 2022-10-08 MED ORDER — HYDROCODONE-ACETAMINOPHEN 10-325 MG PO TABS
1.0000 | ORAL_TABLET | Freq: Four times a day (QID) | ORAL | 0 refills | Status: DC | PRN
Start: 2022-10-08 — End: 2022-11-10
  Filled 2022-10-08: qty 120, 30d supply, fill #0

## 2022-10-08 NOTE — Telephone Encounter (Signed)
Pt need rx for May please send to  [facility] Phone: [phone]  Fax: [phone]

## 2022-10-08 NOTE — Telephone Encounter (Signed)
The refill went to  [id number] - [place], [place] - [address]  I called and talked with the pharmacist.  The refill said "dont fill until July".

## 2022-10-08 NOTE — Telephone Encounter (Signed)
Okay to send refill to   [facility] ?

## 2022-10-08 NOTE — Addendum Note (Signed)
Addended by: Kern Reap B on: 10/08/2022 04:34 PM   Modules accepted: Orders

## 2022-10-08 NOTE — Telephone Encounter (Signed)
Rx sent. Left message on machine for patient. 

## 2022-10-09 ENCOUNTER — Other Ambulatory Visit (HOSPITAL_BASED_OUTPATIENT_CLINIC_OR_DEPARTMENT_OTHER): Payer: Self-pay

## 2022-10-09 ENCOUNTER — Other Ambulatory Visit: Payer: Self-pay

## 2022-10-09 NOTE — Telephone Encounter (Signed)
Spoke with pt pharmacy stated that they have pt Rx ready for pick up, pt was notified and verbalized understanding

## 2022-10-09 NOTE — Telephone Encounter (Signed)
Spoke with pt pharmacy stated hat pt Rx was ready. Pt was notified to pick up Rx

## 2022-11-09 ENCOUNTER — Encounter: Payer: Self-pay | Admitting: Family Medicine

## 2022-11-09 NOTE — Telephone Encounter (Signed)
Pt is aware rx can take up to 3 business days °

## 2022-11-10 ENCOUNTER — Other Ambulatory Visit (HOSPITAL_BASED_OUTPATIENT_CLINIC_OR_DEPARTMENT_OTHER): Payer: Self-pay

## 2022-11-10 ENCOUNTER — Other Ambulatory Visit: Payer: Self-pay | Admitting: Family Medicine

## 2022-11-10 DIAGNOSIS — G8929 Other chronic pain: Secondary | ICD-10-CM

## 2022-11-10 DIAGNOSIS — F1120 Opioid dependence, uncomplicated: Secondary | ICD-10-CM

## 2022-11-10 MED ORDER — HYDROCODONE-ACETAMINOPHEN 10-325 MG PO TABS
1.0000 | ORAL_TABLET | Freq: Four times a day (QID) | ORAL | 0 refills | Status: DC | PRN
Start: 2022-11-10 — End: 2023-03-05
  Filled 2022-11-10: qty 120, 30d supply, fill #0

## 2022-11-10 MED ORDER — HYDROCODONE-ACETAMINOPHEN 10-325 MG PO TABS
1.0000 | ORAL_TABLET | Freq: Four times a day (QID) | ORAL | 0 refills | Status: AC | PRN
Start: 2022-11-10 — End: ?
  Filled 2022-11-10 – 2022-12-11 (×2): qty 120, 30d supply, fill #0

## 2022-11-10 NOTE — Telephone Encounter (Signed)
done

## 2022-11-10 NOTE — Telephone Encounter (Signed)
Pt LOV was on 09/28/22 Last refill was done on 10/08/22 Please advise

## 2022-11-17 ENCOUNTER — Other Ambulatory Visit: Payer: Medicare Other

## 2022-12-07 ENCOUNTER — Ambulatory Visit (INDEPENDENT_AMBULATORY_CARE_PROVIDER_SITE_OTHER): Payer: Medicare Other | Admitting: Family Medicine

## 2022-12-07 VITALS — BP 134/58 | HR 76 | Temp 98.2°F | Ht 64.0 in | Wt 189.4 lb

## 2022-12-07 DIAGNOSIS — G8929 Other chronic pain: Secondary | ICD-10-CM | POA: Diagnosis not present

## 2022-12-07 DIAGNOSIS — F1120 Opioid dependence, uncomplicated: Secondary | ICD-10-CM | POA: Diagnosis not present

## 2022-12-07 DIAGNOSIS — M542 Cervicalgia: Secondary | ICD-10-CM

## 2022-12-07 DIAGNOSIS — F1190 Opioid use, unspecified, uncomplicated: Secondary | ICD-10-CM

## 2022-12-07 NOTE — Progress Notes (Signed)
   Subjective:    Patient ID: [name], female    DOB: Jun 07, 1950, 72 y.o.   MRN: [id number]  HPI Here for pain management. She is doing well.    Review of Systems  Constitutional: Negative.   Musculoskeletal:  Positive for neck pain.       Objective:   Physical Exam Constitutional:      Appearance: Normal appearance.  Neurological:     Mental Status: She is alert.           Assessment & Plan:  Pain management.  Indication for chronic opioid: neck pain Medication and dose: Norco 10-325 # pills per month: 120 Last UDS date: 12-07-22 Opioid Treatment Agreement signed (Y/N): 06-03-18 Opioid Treatment Agreement last reviewed with patient:  12-07-22 NCCSRS reviewed this encounter (include red flags): Yes Meds were refilled.  Gershon Crane, MD

## 2022-12-11 ENCOUNTER — Other Ambulatory Visit (HOSPITAL_BASED_OUTPATIENT_CLINIC_OR_DEPARTMENT_OTHER): Payer: Self-pay

## 2023-01-07 ENCOUNTER — Other Ambulatory Visit: Payer: Self-pay | Admitting: Family Medicine

## 2023-01-07 DIAGNOSIS — G8929 Other chronic pain: Secondary | ICD-10-CM

## 2023-01-07 DIAGNOSIS — F1120 Opioid dependence, uncomplicated: Secondary | ICD-10-CM

## 2023-01-08 ENCOUNTER — Other Ambulatory Visit (HOSPITAL_BASED_OUTPATIENT_CLINIC_OR_DEPARTMENT_OTHER): Payer: Self-pay

## 2023-01-08 NOTE — Telephone Encounter (Signed)
Pt LOV was on 12/07/22 Last refill done 11/10/22 Please advise

## 2023-01-14 ENCOUNTER — Other Ambulatory Visit (HOSPITAL_BASED_OUTPATIENT_CLINIC_OR_DEPARTMENT_OTHER): Payer: Self-pay

## 2023-01-19 ENCOUNTER — Other Ambulatory Visit (HOSPITAL_BASED_OUTPATIENT_CLINIC_OR_DEPARTMENT_OTHER): Payer: Self-pay

## 2023-02-04 ENCOUNTER — Other Ambulatory Visit: Payer: Self-pay | Admitting: Family Medicine

## 2023-02-04 DIAGNOSIS — G8929 Other chronic pain: Secondary | ICD-10-CM

## 2023-02-04 DIAGNOSIS — F1120 Opioid dependence, uncomplicated: Secondary | ICD-10-CM

## 2023-02-05 ENCOUNTER — Other Ambulatory Visit (HOSPITAL_BASED_OUTPATIENT_CLINIC_OR_DEPARTMENT_OTHER): Payer: Self-pay

## 2023-02-05 MED ORDER — HYDROCODONE-ACETAMINOPHEN 10-325 MG PO TABS
1.0000 | ORAL_TABLET | Freq: Four times a day (QID) | ORAL | 0 refills | Status: DC | PRN
Start: 2023-02-05 — End: 2023-03-05
  Filled 2023-02-05: qty 120, 30d supply, fill #0

## 2023-02-05 NOTE — Telephone Encounter (Signed)
Done

## 2023-02-05 NOTE — Telephone Encounter (Signed)
Pt LOV  was on 12/07/22 Last refill was done on 11/10/22 Please advise

## 2023-02-09 DIAGNOSIS — Z1231 Encounter for screening mammogram for malignant neoplasm of breast: Secondary | ICD-10-CM

## 2023-02-15 DIAGNOSIS — Z1231 Encounter for screening mammogram for malignant neoplasm of breast: Secondary | ICD-10-CM

## 2023-02-24 ENCOUNTER — Other Ambulatory Visit: Payer: Self-pay | Admitting: Family Medicine

## 2023-02-24 DIAGNOSIS — Z1231 Encounter for screening mammogram for malignant neoplasm of breast: Secondary | ICD-10-CM

## 2023-03-05 ENCOUNTER — Other Ambulatory Visit (HOSPITAL_BASED_OUTPATIENT_CLINIC_OR_DEPARTMENT_OTHER): Payer: Self-pay

## 2023-03-05 ENCOUNTER — Other Ambulatory Visit: Payer: Self-pay

## 2023-03-05 ENCOUNTER — Ambulatory Visit (INDEPENDENT_AMBULATORY_CARE_PROVIDER_SITE_OTHER): Payer: Medicare Other | Admitting: Family Medicine

## 2023-03-05 VITALS — BP 110/60 | HR 68 | Temp 98.0°F | Wt 198.0 lb

## 2023-03-05 DIAGNOSIS — G8929 Other chronic pain: Secondary | ICD-10-CM

## 2023-03-05 DIAGNOSIS — Z23 Encounter for immunization: Secondary | ICD-10-CM | POA: Diagnosis not present

## 2023-03-05 DIAGNOSIS — M542 Cervicalgia: Secondary | ICD-10-CM | POA: Diagnosis not present

## 2023-03-05 DIAGNOSIS — F1190 Opioid use, unspecified, uncomplicated: Secondary | ICD-10-CM

## 2023-03-05 MED ORDER — HYDROCODONE-ACETAMINOPHEN 10-325 MG PO TABS
1.0000 | ORAL_TABLET | Freq: Four times a day (QID) | ORAL | 0 refills | Status: DC | PRN
Start: 2023-03-05 — End: 2023-05-14
  Filled 2023-03-05: qty 120, 30d supply, fill #0

## 2023-03-05 MED ORDER — HYDROCODONE-ACETAMINOPHEN 10-325 MG PO TABS
1.0000 | ORAL_TABLET | Freq: Four times a day (QID) | ORAL | 0 refills | Status: DC | PRN
  Filled 2023-03-05: qty 120, 30d supply, fill #0
  Filled 2023-05-03: qty 43, 11d supply, fill #0
  Filled 2023-05-10 – 2023-05-13 (×3): qty 43, 11d supply, fill #1

## 2023-03-05 MED ORDER — HYDROCODONE-ACETAMINOPHEN 10-325 MG PO TABS
1.0000 | ORAL_TABLET | Freq: Four times a day (QID) | ORAL | 0 refills | Status: DC | PRN
Start: 2023-03-05 — End: 2023-05-26
  Filled 2023-03-05 – 2023-04-03 (×3): qty 120, 30d supply, fill #0

## 2023-03-05 NOTE — Addendum Note (Signed)
Addended by: Carola Rhine on: 03/05/2023 11:56 AM   Modules accepted: Orders

## 2023-03-05 NOTE — Progress Notes (Signed)
Subjective:    Patient ID: [name], female    DOB: [birth year], 72 y.o.   MRN: [id number]  HPI Here for pain management. She is doing well.    Review of Systems  Constitutional: Negative.   Musculoskeletal:  Positive for neck pain.       Objective:   Physical Exam Constitutional:      Appearance: Normal appearance.  Neurological:     Mental Status: She is alert.           Assessment & Plan:  Pain management.  Indication for chronic opioid: neck pain Medication and dose: Norco 10-325 # pills per month: 120 Last UDS date: 12-07-22 Opioid Treatment Agreement signed (Y/N): 06-03-18 Opioid Treatment Agreement last reviewed with patient:  03-05-23 NCCSRS reviewed this encounter (include red flags): Yes  Meds were refilled.  Gershon Crane, MD

## 2023-03-11 ENCOUNTER — Other Ambulatory Visit: Payer: Self-pay | Admitting: Family Medicine

## 2023-03-11 DIAGNOSIS — R921 Mammographic calcification found on diagnostic imaging of breast: Secondary | ICD-10-CM

## 2023-03-16 DIAGNOSIS — Z1231 Encounter for screening mammogram for malignant neoplasm of breast: Secondary | ICD-10-CM

## 2023-03-18 ENCOUNTER — Encounter: Payer: Self-pay | Admitting: Family Medicine

## 2023-04-01 ENCOUNTER — Other Ambulatory Visit (HOSPITAL_BASED_OUTPATIENT_CLINIC_OR_DEPARTMENT_OTHER): Payer: Self-pay

## 2023-04-01 DIAGNOSIS — L400 Psoriasis vulgaris: Secondary | ICD-10-CM | POA: Diagnosis not present

## 2023-04-03 ENCOUNTER — Other Ambulatory Visit (HOSPITAL_BASED_OUTPATIENT_CLINIC_OR_DEPARTMENT_OTHER): Payer: Self-pay

## 2023-04-30 ENCOUNTER — Other Ambulatory Visit (HOSPITAL_BASED_OUTPATIENT_CLINIC_OR_DEPARTMENT_OTHER): Payer: Self-pay

## 2023-04-30 ENCOUNTER — Encounter: Payer: Self-pay | Admitting: Family Medicine

## 2023-05-03 ENCOUNTER — Telehealth: Payer: Self-pay

## 2023-05-03 ENCOUNTER — Telehealth: Payer: Self-pay | Admitting: Family Medicine

## 2023-05-03 ENCOUNTER — Other Ambulatory Visit (HOSPITAL_BASED_OUTPATIENT_CLINIC_OR_DEPARTMENT_OTHER): Payer: Self-pay

## 2023-05-03 NOTE — Telephone Encounter (Signed)
Copied from CRM [id number]. Topic: Clinical - Medication Refill >> May 03, 2023 11:26 AM Thomes Dinning wrote: Most Recent Primary Care Visit:  Provider: Gershon Crane A  Department: [facility]  Visit Type: OFFICE VISIT  Date: 03/05/2023  Medication: HYDROcodone-acetaminophen (NORCO) 10-325 MG tablet  Has the patient contacted their pharmacy? Yes (Agent: If no, request that the patient contact the pharmacy for the refill. If patient does not wish to contact the pharmacy document the reason why and proceed with request.) (Agent: If yes, when and what did the pharmacy advise?)  Is this the correct pharmacy for this prescription? Yes If no, delete pharmacy and type the correct one.  This is the patient's preferred pharmacy:  [id number] [place], [place] - [address] AT [facility] [address] [place] [address] Phone: [phone] Fax: [phone]  [facility] [address] [place] [address] Phone: [phone] Fax: [phone]   Has the prescription been filled recently? Yes  Is the patient out of the medication? Yes  Has the patient been seen for an appointment in the last year OR does the patient have an upcoming appointment? Yes  Can we respond through MyChart? Yes  Agent: Please be advised that Rx refills may take up to 3 business days. We ask that you follow-up with your pharmacy.

## 2023-05-03 NOTE — Telephone Encounter (Signed)
Spoke with pt pharmacy advised to fill pt Hydrocodone early due to Christmas Holiday per Dr Clent Ridges

## 2023-05-03 NOTE — Telephone Encounter (Signed)
Ask the pharmacy to fill this today please

## 2023-05-03 NOTE — Telephone Encounter (Signed)
This encounter request was sent to Dr Clent Ridges for advise

## 2023-05-03 NOTE — Telephone Encounter (Signed)
She has another refill available on 05-05-23. Please ask the pharmacy to fill this tomorrow (one Corsino early)

## 2023-05-03 NOTE — Telephone Encounter (Signed)
Pt LOV was 03/05/23 Last refill was done on 03/05/23 Please advise

## 2023-05-03 NOTE — Telephone Encounter (Signed)
Prescription Request  05/03/2023  LOV: 03/05/2023  What is the name of the medication or equipment? HYDROcodone-acetaminophen (NORCO) 10-325 MG tablet . Pt would like early refill Have you contacted your pharmacy to request a refill? No   Which pharmacy would you like this sent to?  [facility] Phone: [phone]  Fax: [phone]    Patient notified that their request is being sent to the clinical staff for review and that they should receive a response within 2 business days.   Please advise at Mobile [phone] (mobile)

## 2023-05-10 ENCOUNTER — Other Ambulatory Visit (HOSPITAL_BASED_OUTPATIENT_CLINIC_OR_DEPARTMENT_OTHER): Payer: Self-pay

## 2023-05-12 ENCOUNTER — Encounter (HOSPITAL_BASED_OUTPATIENT_CLINIC_OR_DEPARTMENT_OTHER): Payer: Self-pay

## 2023-05-13 ENCOUNTER — Other Ambulatory Visit (HOSPITAL_BASED_OUTPATIENT_CLINIC_OR_DEPARTMENT_OTHER): Payer: Self-pay

## 2023-05-13 ENCOUNTER — Other Ambulatory Visit: Payer: Self-pay | Admitting: Family Medicine

## 2023-05-13 DIAGNOSIS — G8929 Other chronic pain: Secondary | ICD-10-CM

## 2023-05-14 ENCOUNTER — Other Ambulatory Visit (HOSPITAL_BASED_OUTPATIENT_CLINIC_OR_DEPARTMENT_OTHER): Payer: Self-pay

## 2023-05-14 ENCOUNTER — Encounter: Payer: Self-pay | Admitting: Family Medicine

## 2023-05-14 MED ORDER — HYDROCODONE-ACETAMINOPHEN 10-325 MG PO TABS
1.0000 | ORAL_TABLET | Freq: Four times a day (QID) | ORAL | 0 refills | Status: DC | PRN
  Filled 2023-05-14: qty 77, 20d supply, fill #0

## 2023-05-14 NOTE — Telephone Encounter (Signed)
 I sent in for the remaining 77 pills

## 2023-05-17 NOTE — Telephone Encounter (Signed)
 Pt was notified that Dr Clent Ridges sent a new Rx for her pain medication to her pharmacy

## 2023-05-19 ENCOUNTER — Ambulatory Visit
Admission: RE | Admit: 2023-05-19 | Discharge: 2023-05-19 | Disposition: A | Discharge status: Home / Self Care | Payer: Medicare Other | Source: Ambulatory Visit | Attending: Family Medicine | Admitting: Family Medicine

## 2023-05-19 DIAGNOSIS — M8588 Other specified disorders of bone density and structure, other site: Secondary | ICD-10-CM | POA: Diagnosis not present

## 2023-05-19 DIAGNOSIS — Z13820 Encounter for screening for osteoporosis: Secondary | ICD-10-CM

## 2023-05-26 ENCOUNTER — Ambulatory Visit (INDEPENDENT_AMBULATORY_CARE_PROVIDER_SITE_OTHER): Payer: Medicare Other | Admitting: Family Medicine

## 2023-05-26 ENCOUNTER — Ambulatory Visit: Payer: Medicare Other | Admitting: Family Medicine

## 2023-05-26 ENCOUNTER — Ambulatory Visit
Admission: RE | Admit: 2023-05-26 | Discharge: 2023-05-26 | Disposition: A | Discharge status: Home / Self Care | Payer: Medicare Other | Source: Ambulatory Visit | Attending: Family Medicine | Admitting: Family Medicine

## 2023-05-26 ENCOUNTER — Encounter: Payer: Self-pay | Admitting: Family Medicine

## 2023-05-26 VITALS — BP 110/70 | HR 71 | Temp 98.1°F | Wt 198.0 lb

## 2023-05-26 DIAGNOSIS — R921 Mammographic calcification found on diagnostic imaging of breast: Secondary | ICD-10-CM

## 2023-05-26 DIAGNOSIS — F1190 Opioid use, unspecified, uncomplicated: Secondary | ICD-10-CM | POA: Diagnosis not present

## 2023-05-26 DIAGNOSIS — M542 Cervicalgia: Secondary | ICD-10-CM | POA: Diagnosis not present

## 2023-05-26 DIAGNOSIS — G8929 Other chronic pain: Secondary | ICD-10-CM

## 2023-05-26 MED ORDER — HYDROCODONE-ACETAMINOPHEN 10-325 MG PO TABS: 1.0000 | ORAL_TABLET | Freq: Four times a day (QID) | ORAL | 0 refills | Status: DC | PRN

## 2023-05-26 NOTE — Progress Notes (Signed)
   Subjective:    Patient ID: [name], female    DOB: April 09, 1951, 73 y.o.   MRN: [id number]  HPI Here for pain management. She is doing well.    Review of Systems  Constitutional: Negative.   Musculoskeletal:  Positive for neck pain.       Objective:   Physical Exam Constitutional:      Appearance: Normal appearance.  Neurological:     Mental Status: She is alert.           Assessment & Plan:  Pain management. Indication for chronic opioid: neck pain Medication and dose: Norco 10-325 # pills per month: 120 Last UDS date: 12-07-22 Opioid Treatment Agreement signed (Y/N): 06-03-18 Opioid Treatment Agreement last reviewed with patient:  05-26-23 [facility] reviewed this encounter (include red flags): Yes Meds were refilled. Corita Diego, MD

## 2023-05-31 ENCOUNTER — Other Ambulatory Visit (HOSPITAL_BASED_OUTPATIENT_CLINIC_OR_DEPARTMENT_OTHER): Payer: Self-pay

## 2023-07-07 ENCOUNTER — Ambulatory Visit (INDEPENDENT_AMBULATORY_CARE_PROVIDER_SITE_OTHER): Payer: Medicare Other | Admitting: Family Medicine

## 2023-07-07 ENCOUNTER — Encounter: Payer: Self-pay | Admitting: Family Medicine

## 2023-07-07 VITALS — BP 102/60 | HR 72 | Temp 98.2°F | Wt 198.0 lb

## 2023-07-07 DIAGNOSIS — R350 Frequency of micturition: Secondary | ICD-10-CM

## 2023-07-07 DIAGNOSIS — B3731 Acute candidiasis of vulva and vagina: Secondary | ICD-10-CM

## 2023-07-07 LAB — POCT URINALYSIS DIPSTICK
Bilirubin, UA: NEGATIVE
Blood, UA: NEGATIVE
Glucose, UA: NEGATIVE
Ketones, UA: NEGATIVE
Leukocytes, UA: NEGATIVE
Nitrite, UA: NEGATIVE
Protein, UA: POSITIVE — AB
Spec Grav, UA: 1.025 (ref 1.010–1.025)
Urobilinogen, UA: 0.2 U/dL
pH, UA: 5 (ref 5.0–8.0)

## 2023-07-07 MED ORDER — FLUCONAZOLE 150 MG PO TABS: 150.0000 mg | ORAL_TABLET | Freq: Every day | ORAL | 5 refills | Status: DC

## 2023-07-07 NOTE — Addendum Note (Signed)
 Addended by: Carola Rhine on: 07/07/2023 11:26 AM   Modules accepted: Orders

## 2023-07-07 NOTE — Progress Notes (Signed)
   Subjective:    Patient ID: [name], female    DOB: 1950/06/20, 73 y.o.   MRN: [id number]  HPI Here for one week of perineal itching and burning with urination. She has noticed some DC as well. No urinary urgency.    Review of Systems  Constitutional: Negative.   Respiratory: Negative.    Cardiovascular: Negative.   Genitourinary:  Positive for dysuria and vaginal discharge. Negative for flank pain, frequency, hematuria and urgency.       Objective:   Physical Exam Constitutional:      Appearance: Normal appearance. She is not ill-appearing.  Cardiovascular:     Rate and Rhythm: Normal rate and regular rhythm.     Pulses: Normal pulses.     Heart sounds: Normal heart sounds.  Pulmonary:     Effort: Pulmonary effort is normal.     Breath sounds: Normal breath sounds.  Abdominal:     Tenderness: There is no right CVA tenderness or left CVA tenderness.  Neurological:     Mental Status: She is alert.           Assessment & Plan:  Yeast vaginitis, treat with Diflucan.  Gershon Crane, MD

## 2023-07-21 ENCOUNTER — Ambulatory Visit (INDEPENDENT_AMBULATORY_CARE_PROVIDER_SITE_OTHER)

## 2023-07-21 VITALS — BP 120/62 | HR 61 | Temp 98.2°F | Ht 64.0 in | Wt 199.2 lb

## 2023-07-21 DIAGNOSIS — Z0000 Encounter for general adult medical examination without abnormal findings: Secondary | ICD-10-CM | POA: Diagnosis not present

## 2023-07-21 NOTE — Patient Instructions (Addendum)
 [name] , Thank you for taking time to come for your Medicare Wellness Visit. I appreciate your ongoing commitment to your health goals. Please review the following plan we discussed and let me know if I can assist you in the future.   Referrals/Orders/Follow-Ups/Clinician Recommendations: A referral has been placed for you to have a hearing test. If you have not heard from them within 7 business days, please call the office to schedule and appointment.   [name] [address] [place] [address] Phone: [phone]   This is a list of the screening recommended for you and due dates:  Health Maintenance  Topic Date Due   COVID-19 Vaccine (7 - 2024-25 season) 01/10/2023   Mammogram  05/25/2024   Medicare Annual Wellness Visit  07/20/2024   DTaP/Tdap/Td vaccine (2 - Td or Tdap) 07/23/2024   Colon Cancer Screening  08/28/2029   Pneumonia Vaccine  Completed   Flu Shot  Completed   DEXA scan (bone density measurement)  Completed   Hepatitis C Screening  Completed   HPV Vaccine  Aged Out   Zoster (Shingles) Vaccine  Discontinued   Opioid Pain Medicine Management Opioids are powerful medicines that are used to treat moderate to severe pain. When used for short periods of time, they can help you to: Sleep better. Do better in physical or occupational therapy. Feel better in the first few days after an injury. Recover from surgery. Opioids should be taken with the supervision of a trained health care provider. They should be taken for the shortest period of time possible. This is because opioids can be addictive, and the longer you take opioids, the greater your risk of addiction. This addiction can also be called opioid use disorder. What are the risks? Using opioid pain medicines for longer than 3 days increases your risk of side effects. Side effects include: Constipation. Nausea and vomiting. Breathing difficulties (respiratory  depression). Drowsiness. Confusion. Opioid use disorder. Itching. Taking opioid pain medicine for a long period of time can affect your ability to do daily tasks. It also puts you at risk for: Motor vehicle crashes. Depression. Suicide. Heart attack. Overdose, which can be life-threatening. What is a pain treatment plan? A pain treatment plan is an agreement between you and your health care provider. Pain is unique to each person, and treatments vary depending on your condition. To manage your pain, you and your health care provider need to work together. To help you do this: Discuss the goals of your treatment, including how much pain you might expect to have and how you will manage the pain. Review the risks and benefits of taking opioid medicines. Remember that a good treatment plan uses more than one approach and minimizes the chance of side effects. Be honest about the amount of medicines you take and about any drug or alcohol use. Get pain medicine prescriptions from only one health care provider. Pain can be managed with many types of alternative treatments. Ask your health care provider to refer you to one or more specialists who can help you manage pain through: Physical or occupational therapy. Counseling (cognitive behavioral therapy). Good nutrition. Biofeedback. Massage. Meditation. Non-opioid medicine. Following a gentle exercise program. How to use opioid pain medicine Taking medicine Take your pain medicine exactly as told by your health care provider. Take it only when you need it. If your pain gets less severe, you may take less than your prescribed dose if your health care provider approves. If  you are not having pain, do nottake pain medicine unless your health care provider tells you to take it. If your pain is severe, do nottry to treat it yourself by taking more pills than instructed on your prescription. Contact your health care provider for help. Write down  the times when you take your pain medicine. It is easy to become confused while on pain medicine. Writing the time can help you avoid overdose. Take other over-the-counter or prescription medicines only as told by your health care provider. Keeping yourself and others safe  While you are taking opioid pain medicine: Do not drive, use machinery, or power tools. Do not sign legal documents. Do not drink alcohol. Do not take sleeping pills. Do not supervise children by yourself. Do not do activities that require climbing or being in high places. Do not go to a lake, river, ocean, spa, or swimming pool. Do not share your pain medicine with anyone. Keep pain medicine in a locked cabinet or in a secure area where pets and children cannot reach it. Stopping your use of opioids If you have been taking opioid medicine for more than a few weeks, you may need to slowly decrease (taper) how much you take until you stop completely. Tapering your use of opioids can decrease your risk of symptoms of withdrawal, such as: Pain and cramping in the abdomen. Nausea. Sweating. Sleepiness. Restlessness. Uncontrollable shaking (tremors). Cravings for the medicine. Do not attempt to taper your use of opioids on your own. Talk with your health care provider about how to do this. Your health care provider may prescribe a step-down schedule based on how much medicine you are taking and how long you have been taking it. Getting rid of leftover pills Do not save any leftover pills. Get rid of leftover pills safely by: Taking the medicine to a prescription take-back program. This is usually offered by the county or law enforcement. Bringing them to a pharmacy that has a drug disposal container. Flushing them down the toilet. Check the label or package insert of your medicine to see whether this is safe to do. Throwing them out in the trash. Check the label or package insert of your medicine to see whether this is  safe to do. If it is safe to throw it out, remove the medicine from the original container, put it into a sealable bag or container, and mix it with used coffee grounds, food scraps, dirt, or cat litter before putting it in the trash. Follow these instructions at home: Activity Do exercises as told by your health care provider. Avoid activities that make your pain worse. Return to your normal activities as told by your health care provider. Ask your health care provider what activities are safe for you. General instructions You may need to take these actions to prevent or treat constipation: Drink enough fluid to keep your urine pale yellow. Take over-the-counter or prescription medicines. Eat foods that are high in fiber, such as beans, whole grains, and fresh fruits and vegetables. Limit foods that are high in fat and processed sugars, such as fried or sweet foods. Keep all follow-up visits. This is important. Where to find support If you have been taking opioids for a long time, you may benefit from receiving support for quitting from a local support group or counselor. Ask your health care provider for a referral to these resources in your area. Where to find more information Centers for Disease Control and Prevention (CDC): FootballExhibition.com.br [place] Food  and Drug Administration (FDA): PumpkinSearch.com.ee Get help right away if: You may have taken too much of an opioid (overdosed). Common symptoms of an overdose: Your breathing is slower or more shallow than normal. You have a very slow heartbeat (pulse). You have slurred speech. You have nausea and vomiting. Your pupils become very small. You have other potential symptoms: You are very confused. You faint or feel like you will faint. You have cold, clammy skin. You have blue lips or fingernails. You have thoughts of harming yourself or harming others. These symptoms may represent a serious problem that is an emergency. Do not wait to see if the  symptoms will go away. Get medical help right away. Call your local emergency services (911 in the [place] Do not drive yourself to the hospital.  If you ever feel like you may hurt yourself or others, or have thoughts about taking your own life, get help right away. Go to your nearest emergency department or: Call your local emergency services (911 in the [place] Call the [facility] (250-098-4127 in the [place] Call a suicide crisis helpline, such as the National Suicide Prevention Lifeline at 914-660-2723 or 988 in the [place] This is open 24 hours a [place] in the [place] If you're a Veteran: Call 988 and press 1. This is open 24 hours a [place]. Text the [employer] at 641-653-3348. Summary Opioid medicines can help you manage moderate to severe pain for a short period of time. A pain treatment plan is an agreement between you and your health care provider. Discuss the goals of your treatment, including how much pain you might expect to have and how you will manage the pain. If you think that you or someone else may have taken too much of an opioid, get medical help right away. This information is not intended to replace advice given to you by your health care provider. Make sure you discuss any questions you have with your health care provider. Document Revised: 02/01/2023 Document Reviewed: 08/07/2020 [employer] Patient Education  2024 [employer] Advanced directives: (In Chart) A copy of your advanced directives are scanned into your chart should your provider ever need it.  Next Medicare Annual Wellness Visit scheduled for next year: Yes

## 2023-07-21 NOTE — Progress Notes (Addendum)
 Subjective:   [name] is a 73 y.o. female who presents for Medicare Annual (Subsequent) preventive examination.  Visit Complete: In person   Cardiac Risk Factors include: advanced age (>4men, >40 women);hypertension     Objective:    Today's Vitals   07/21/23 1257  BP: 120/62  Pulse: 61  Temp: 98.2 F (36.8 C)  TempSrc: Oral  SpO2: 91%  Weight: 199 lb 3.2 oz (90.4 kg)  Height: 5\' 4"  (1.626 m)   Body mass index is 34.19 kg/m.     07/21/2023    1:26 PM 08/16/2022   11:00 AM 05/22/2022    9:22 AM 03/04/2022   11:50 AM 03/14/2021   10:17 AM 10/25/2020   10:46 PM 10/25/2020   10:11 PM  Advanced Directives  Does Patient Have a Medical Advance Directive? Yes No Yes No No  No  Type of Estate agent of [place];Living will  Healthcare Power of [place];Living will      Does patient want to make changes to medical advance directive? No - Patient declined  No - Patient declined  No - Patient declined  No - Patient declined  Copy of Healthcare Power of Attorney in Chart? Yes - validated most recent copy scanned in chart (See row information)  Yes - validated most recent copy scanned in chart (See row information)      Would patient like information on creating a medical advance directive?  Yes (ED - Information included in AVS)    No - Patient declined     Current Medications (verified) Outpatient Encounter Medications as of 07/21/2023  Medication Sig   Ascorbic Acid (VITAMIN C PO) Take 1,000 mg by mouth daily. Ester C   fluconazole (DIFLUCAN) 150 MG tablet Take 1 tablet (150 mg total) by mouth daily. (Patient not taking: Reported on 07/21/2023)   Fluocinolone Acetonide 0.01 % OIL 5 drops into affected ear Otic Twice a Haley for 7 days   hydrochlorothiazide (HYDRODIURIL) 25 MG tablet Take 1 tablet (25 mg total) by mouth daily.   HYDROcodone-acetaminophen (NORCO) 10-325 MG tablet Take 1 tablet by mouth every 6 (six) hours as needed for moderate pain (pain  score 4-6).   HYDROcodone-acetaminophen (NORCO) 10-325 MG tablet Take 1 tablet by mouth every 6 (six) hours as needed for moderate pain (pain score 4-6).   HYDROcodone-acetaminophen (NORCO) 10-325 MG tablet Take 1 tablet by mouth every 6 (six) hours as needed for moderate pain (pain score 4-6).   metoprolol succinate (TOPROL-XL) 100 MG 24 hr tablet TAKE 1 TABLET BY MOUTH IN THE MORNING AND AT BEDTIME EVERY Frier   Multiple Vitamins-Minerals (MULTIVITAMIN WITH MINERALS) tablet Take 1 tablet by mouth daily.   SKYRIZI PEN 150 MG/ML SOAJ Inject into the skin.   No facility-administered encounter medications on file as of 07/21/2023.    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 Tressie Stalker   COLECTOMY WITH COLOSTOMY CREATION/HARTMANN PROCEDURE N/A 04/14/2019   Procedure: SIGMOID COLECTOMY, RESECTION OF SMALL BOWEL,WITH END COLOSTOMY, PROCTOSCOPY RIGID;  Surgeon: Ovidio Kin, MD;  Location: [facility];  Service: General;  Laterality: N/A;   COLONOSCOPY  08/29/2019   per Dr. Rhea Belton, diverticulosis and one benign polyp, repeat in 10  yrs    COLOSTOMY TAKEDOWN N/A 12/07/2019   Procedure: LAPAROSCOPIC COLOSTOMY REVERSAL;  Surgeon: Ovidio Kin, MD;  Location: [facility];  Service: General;  Laterality: N/A;   CYSTOSCOPY WITH STENT PLACEMENT Bilateral 04/14/2019   Procedure: CYSTOSCOPY WITH  BILATERAL STENT PLACEMENT;  Surgeon: Noel Christmas, MD;  Location: [facility];  Service: Urology;  Laterality: Bilateral;   DILATION AND CURETTAGE OF UTERUS     EAR CYST EXCISION Right 07/05/2014   Procedure: EXCISION FLEXOR SHEATH CYST WITH RELEASE A-1 PULLEY RIGHT SMALL FINGER;  Surgeon: Betha Loa, MD;  Location: [facility];  Service: Orthopedics;  Laterality: Right;   EYE SURGERY Bilateral    cataract surgery   INSERTION OF MESH N/A 10/25/2020   Procedure: INSERTION OF MESH;  Surgeon: Karie Soda, MD;  Location: [facility];  Service: General;  Laterality: N/A;   KNEE SURGERY Right    LYSIS OF ADHESION N/A 10/25/2020   Procedure: LYSIS OF ADHESION;  Surgeon: Karie Soda, MD;  Location: [facility];  Service: General;  Laterality: N/A;   SPINE SURGERY     STOMACH SURGERY     TRIGGER FINGER RELEASE Right 07/05/2014   Procedure: RELEASE RIGHT SMALL FINGER/A-1 PULLEY;  Surgeon: Betha Loa, MD;  Location: [facility];  Service: Orthopedics;  Laterality: Right;   TUBOPLASTY / TUBOTUBAL ANASTOMOSIS     VENTRAL HERNIA REPAIR N/A 10/25/2020   Procedure: LAPAROSCOPIC VENTRAL WALL HERNIA REPAIR;  Surgeon: Karie Soda, MD;  Location: [facility];  Service: General;  Laterality: N/A;   Family History  Problem Relation Age of Onset   Arthritis Other    Breast cancer Other    Hypertension Other    Breast cancer Mother    Colon cancer Neg Hx    Colon polyps Neg Hx    Esophageal cancer Neg Hx    Stomach cancer Neg Hx    Rectal cancer Neg Hx    Social History   Socioeconomic History   Marital status: Married    Spouse name: Not on file   Number of children: Not on file   Years of education: Not on file   Highest education level: GED or equivalent  Occupational History   Not on file  Tobacco Use   Smoking status: Former    Current packs/Byer: 0.00    Types: Cigarettes    Quit date: 06/02/2019    Years since quitting: 4.1   Smokeless tobacco: Never   Tobacco comments:     3 weeks ago  Vaping Use   Vaping status: Never Used  Substance and Sexual Activity   Alcohol use: No    Alcohol/week: 0.0 standard drinks of alcohol   Drug use: Never   Sexual activity: Not on file  Other Topics Concern   Not on file  Social History Narrative   Not on file   Social Drivers of Health   Financial Resource Strain: Low Risk  (07/21/2023)   Overall Financial Resource Strain (CARDIA)    Difficulty of Paying Living Expenses: Not hard at all  Food Insecurity: No Food Insecurity (07/21/2023)   Hunger Vital  Sign    Worried About Programme researcher, broadcasting/film/video in the Last Year: Never true    Ran Out of Food in the Last Year: Never true  Transportation Needs: No Transportation Needs (07/21/2023)   PRAPARE - Administrator, Civil Service (Medical): No    Lack of Transportation (Non-Medical): No  Physical Activity: Inactive (07/21/2023)   Exercise Vital Sign    Days of Exercise per Week: 0 days    Minutes of Exercise per Session: 0 min  Stress: No Stress Concern Present (07/21/2023)   [employer] of Occupational Health - Occupational Stress Questionnaire    Feeling of Stress : Not at all  Social Connections: Socially Integrated (07/21/2023)   Social Connection and Isolation Panel [NHANES]    Frequency of Communication with Friends and Family: More than three times a week    Frequency of Social Gatherings with Friends and Family: More than three times a week    Attends Religious Services: More than 4 times per year    Active Member of [employer] or Organizations: Yes    Attends Engineer, structural: More than 4 times per year    Marital Status: Married    Tobacco Counseling Counseling given: Not Answered Tobacco comments: 3 weeks ago   Clinical Intake:  Pre-visit preparation completed: Yes  Pain : No/denies pain     BMI - recorded: 34.19 Nutritional Status: BMI > 30  Obese Nutritional Risks: None Diabetes: No  How often do you need to have  someone help you when you read instructions, pamphlets, or other written materials from your doctor or pharmacy?: 1 - Never  Interpreter Needed?: No  Information entered by :: Theresa Mulligan LPN   Activities of Daily Living    07/21/2023    1:23 PM  In your present state of health, do you have any difficulty performing the following activities:  Hearing? 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 0  Difficulty concentrating or making decisions? 0  Walking or climbing stairs? 0  Dressing or bathing? 0  Doing errands, shopping? 0  Preparing Food and eating ? N  Using the Toilet? N  In the past six months, have you accidently leaked urine? N  Comment Wears Breifs followed by PCP  Do you have problems with loss of bowel control? N  Managing your Medications? N  Managing your Finances? N  Housekeeping or managing your Housekeeping? N    Patient Care Team: Nelwyn Salisbury, MD as PCP - Erline Hau, MD as Consulting Physician (General Surgery)  Indicate any recent Medical Services you may have received from other than [facility] providers in the past year (date may be approximate).     Assessment:   This is a routine wellness examination for [place].  Hearing/Vision screen Hearing Screening - Comments:: A referral has been placed for you to have a hearing test. If you have not heard from them within 7 business days, please call the office to schedule and appointment.   [name] [address] [place] [address] Phone: [phone]  Vision Screening - Comments:: Wears rx glasses - up to date with routine eye exams with  [facility]   Goals Addressed               This Visit's Progress     Increase physical activity (pt-stated)         Depression Screen    07/21/2023    1:22  PM 07/23/2022   11:35 AM 06/24/2022   11:56 AM 05/22/2022    9:16 AM 03/05/2022   10:12 AM 02/20/2022    3:03 PM 12/01/2021    4:55 PM  PHQ 2/9 Scores   PHQ - 2 Score 0 0 0 0 0 0 0  PHQ- 9 Score  0 0  2 0 0    Fall Risk    07/21/2023    1:26 PM 12/07/2022    7:44 AM 07/23/2022   11:35 AM 06/24/2022   11:56 AM 05/22/2022    9:21 AM  Fall Risk   Falls in the past year? 0 0 0 0 0  Number falls in past yr: 0  0 0 0  Injury with Fall? 0  0 0 0  Risk for fall due to : No Fall Risks  No Fall Risks No Fall Risks No Fall Risks  Follow up Falls prevention discussed;Falls evaluation completed  Falls evaluation completed Falls evaluation completed Falls prevention discussed    MEDICARE RISK AT HOME: Medicare Risk at Home Any stairs in or around the home?: No If so, are there any without handrails?: No Home free of loose throw rugs in walkways, pet beds, electrical cords, etc?: Yes Adequate lighting in your home to reduce risk of falls?: Yes Life alert?: No Use of a cane, walker or w/c?: No Grab bars in the bathroom?: Yes Shower chair or bench in shower?: Yes Elevated toilet seat or a handicapped toilet?: No  TIMED UP AND GO:  Was the test performed?  Yes  Length of time to ambulate 10 feet: 10 sec Gait steady and fast without use of assistive device    Cognitive Function:        07/21/2023    1:27 PM 05/22/2022    9:22 AM  6CIT Screen  What Year? 0 points 0 points  What month? 0 points 0 points  What time? 0 points 0 points  Count back from 20 0 points 0 points  Months in reverse 0 points 0 points  Repeat phrase 0 points 0 points  Total Score 0 points 0 points    Immunizations Immunization History  Administered Date(s) Administered   Fluad Trivalent(High Dose 65+) 03/05/2023   Influenza Inj Mdck Quad Pf 03/20/2021, 02/20/2022   Influenza-Unspecified 01/21/2017   PFIZER(Purple Top)SARS-COV-2 Vaccination 06/23/2019, 07/15/2019, 02/09/2020, 09/14/2020, 09/09/2021   Pfizer Covid-19 Vaccine Bivalent Booster 79yrs & up 02/07/2022   Pneumococcal Conjugate-13 07/24/2014   Pneumococcal Polysaccharide-23 11/16/2017   Rsv,  Bivalent, Protein Subunit Rsvpref,pf Verdis Frederickson) 02/07/2022   Tdap 07/24/2014   Zoster Recombinant(Shingrix) 02/07/2022   Zoster, Live 08/15/2014    TDAP status: Up to date  Flu Vaccine status: Up to date  Pneumococcal vaccine status: Up to date  Covid-19 vaccine status: Declined, Education has been provided regarding the importance of this vaccine but patient still declined. Advised may receive this vaccine at local pharmacy or Health Dept.or vaccine clinic. Aware to provide a copy of the vaccination record if obtained from local pharmacy or Health Dept. Verbalized acceptance and understanding.   Screening Tests Health Maintenance  Topic Date Due   COVID-19 Vaccine (7 - 2024-25 season) 01/10/2023   MAMMOGRAM  05/25/2024   Medicare Annual Wellness (AWV)  07/20/2024   DTaP/Tdap/Td (2 - Td or Tdap) 07/23/2024   Colonoscopy  08/28/2029   Pneumonia Vaccine 16+ Years old  Completed   INFLUENZA VACCINE  Completed   DEXA SCAN  Completed   Hepatitis C Screening  Completed  HPV VACCINES  Aged Out   Zoster Vaccines- Shingrix  Discontinued    Health Maintenance  Health Maintenance Due  Topic Date Due   COVID-19 Vaccine (7 - 2024-25 season) 01/10/2023    Colorectal cancer screening: Type of screening: Colonoscopy. Completed 08/29/19. Repeat every 10 years  Mammogram status: Completed 05/26/23. Repeat every year  Bone Density status: Completed 05/19/23. Results reflect: Bone density results: OSTEOPOROSIS. Repeat every   years.     Additional Screening:  Hepatitis C Screening: does not qualify; Completed 08/04/16  Vision Screening: Recommended annual ophthalmology exams for early detection of glaucoma and other disorders of the eye. Is the patient up to date with their annual eye exam?  Yes  Who is the provider or what is the name of the office in which the patient attends annual eye exams? [facility] If pt is not established with a provider, would they like to be referred to a  provider to establish care? No .   Dental Screening: Recommended annual dental exams for proper oral hygiene    Community Resource Referral / Chronic Care Management:  CRR required this visit?  No   CCM required this visit?  No     Plan:     I have personally reviewed and noted the following in the patient's chart:   Medical and social history Use of alcohol, tobacco or illicit drugs  Current medications and supplements including opioid prescriptions. Patient is currently taking opioid prescriptions. Information provided to patient regarding non-opioid alternatives. Patient advised to discuss non-opioid treatment plan with their provider. Functional ability and status Nutritional status Physical activity Advanced directives List of other physicians Hospitalizations, surgeries, and ER visits in previous 12 months Vitals Screenings to include cognitive, depression, and falls Referrals and appointments  In addition, I have reviewed and discussed with patient certain preventive protocols, quality metrics, and best practice recommendations. A written personalized care plan for preventive services as well as general preventive health recommendations were provided to patient.     Tillie Rung, LPN   01/07/5620   After Visit Summary: (MyChart) Due to this being a telephonic visit, the after visit summary with patients personalized plan was offered to patient via MyChart   Nurse Notes: None

## 2023-08-24 ENCOUNTER — Encounter: Payer: Self-pay | Admitting: Family Medicine

## 2023-08-24 ENCOUNTER — Ambulatory Visit (INDEPENDENT_AMBULATORY_CARE_PROVIDER_SITE_OTHER): Admitting: Family Medicine

## 2023-08-24 VITALS — BP 114/60 | HR 74 | Temp 98.2°F | Wt 199.0 lb

## 2023-08-24 DIAGNOSIS — M542 Cervicalgia: Secondary | ICD-10-CM | POA: Diagnosis not present

## 2023-08-24 DIAGNOSIS — G8929 Other chronic pain: Secondary | ICD-10-CM

## 2023-08-24 DIAGNOSIS — F1190 Opioid use, unspecified, uncomplicated: Secondary | ICD-10-CM

## 2023-08-24 MED ORDER — HYDROCODONE-ACETAMINOPHEN 10-325 MG PO TABS: 1.0000 | ORAL_TABLET | Freq: Four times a day (QID) | ORAL | 0 refills | Status: DC | PRN

## 2023-08-24 MED ORDER — TRAMADOL HCL 50 MG PO TABS: 100.0000 mg | ORAL_TABLET | Freq: Three times a day (TID) | ORAL | 2 refills | Status: DC | PRN

## 2023-08-24 NOTE — Progress Notes (Signed)
   Subjective:    Patient ID: [name], female    DOB: 02/16/1951, 73 y.o.   MRN: [id number]  HPI Here for pain management. She has been experiencing more breakthrough pain over the past month, and she has nothing to use for this. She recently lost her job so she is looking for a new part time job where she can work from home.   Review of Systems  Constitutional: Negative.   Musculoskeletal:  Positive for neck pain.       Objective:   Physical Exam Constitutional:      Appearance: Normal appearance.  Neurological:     Mental Status: She is alert.           Assessment & Plan:  Pain management.  Indication for chronic opioid: neck pain Medication and dose: Norco 10-325 # pills per month: 120 Last UDS date: 12-07-22 Opioid Treatment Agreement signed (Y/N): 06-03-18 Opioid Treatment Agreement last reviewed with patient:  08-24-23 NCCSRS reviewed this encounter (include red flags): Yes We refilled her Norco, and we will add Tramadol 50 mg that she can use as needed for breakthrough pain.  Corita Diego, MD

## 2023-09-29 ENCOUNTER — Other Ambulatory Visit: Payer: Self-pay | Admitting: Family Medicine

## 2023-09-29 DIAGNOSIS — I10 Essential (primary) hypertension: Secondary | ICD-10-CM

## 2023-10-06 DIAGNOSIS — L400 Psoriasis vulgaris: Secondary | ICD-10-CM | POA: Diagnosis not present

## 2023-10-15 DIAGNOSIS — L400 Psoriasis vulgaris: Secondary | ICD-10-CM | POA: Diagnosis not present

## 2023-10-28 ENCOUNTER — Ambulatory Visit: Admitting: Family Medicine

## 2023-10-29 ENCOUNTER — Ambulatory Visit: Admitting: Family Medicine

## 2023-10-29 VITALS — BP 102/60 | HR 68 | Temp 98.3°F | Wt 196.6 lb

## 2023-10-29 DIAGNOSIS — M542 Cervicalgia: Secondary | ICD-10-CM | POA: Diagnosis not present

## 2023-10-29 DIAGNOSIS — G8929 Other chronic pain: Secondary | ICD-10-CM | POA: Diagnosis not present

## 2023-10-29 DIAGNOSIS — R300 Dysuria: Secondary | ICD-10-CM

## 2023-10-29 LAB — POC URINALSYSI DIPSTICK (AUTOMATED)
Bilirubin, UA: NEGATIVE
Blood, UA: NEGATIVE
Glucose, UA: NEGATIVE
Ketones, UA: NEGATIVE
Leukocytes, UA: NEGATIVE
Nitrite, UA: NEGATIVE
Protein, UA: NEGATIVE
Spec Grav, UA: 1.025 (ref 1.010–1.025)
Urobilinogen, UA: 0.2 U/dL
pH, UA: 6 (ref 5.0–8.0)

## 2023-10-29 MED ORDER — HYDROCODONE-ACETAMINOPHEN 10-325 MG PO TABS: 1.0000 | ORAL_TABLET | Freq: Four times a day (QID) | ORAL | 0 refills | Status: DC | PRN

## 2023-10-29 MED ORDER — HYDROCODONE-ACETAMINOPHEN 10-325 MG PO TABS
1.0000 | ORAL_TABLET | Freq: Four times a day (QID) | ORAL | 0 refills | Status: DC | PRN
Start: 2023-10-29 — End: 2024-01-26

## 2023-10-29 MED ORDER — TRAMADOL HCL 50 MG PO TABS: 100.0000 mg | ORAL_TABLET | Freq: Four times a day (QID) | ORAL | 5 refills | Status: DC | PRN

## 2023-10-29 NOTE — Progress Notes (Signed)
   Subjective:    Patient ID: [name], female    DOB: 1951/04/20, 73 y.o.   MRN: [id number]  HPI Here for pain management. She is doing well. The addition of Tramadol  to the Norco has been very helpful for her.    Review of Systems  Constitutional: Negative.   Musculoskeletal:  Positive for neck pain.       Objective:   Physical Exam Constitutional:      Appearance: Normal appearance.   Neurological:     Mental Status: She is alert.           Assessment & Plan:  Pain management.  Indication for chronic opioid: neck pain Medication and dose: Norco 10-325 and Tramadol  50 mg # pills per month: 120 and 120 Last UDS date: 12-07-22 Opioid Treatment Agreement signed (Y/N): 06-03-18 Opioid Treatment Agreement last reviewed with patient:  10-29-23 NCCSRS reviewed this encounter (include red flags): Yes Meds were refilled.  Corita Diego, MD

## 2023-10-29 NOTE — Addendum Note (Signed)
 Addended by: Philbert Brave on: 10/29/2023 04:24 PM   Modules accepted: Orders

## 2023-11-05 DIAGNOSIS — H5010 Unspecified exotropia: Secondary | ICD-10-CM | POA: Diagnosis not present

## 2023-11-05 DIAGNOSIS — H53001 Unspecified amblyopia, right eye: Secondary | ICD-10-CM | POA: Diagnosis not present

## 2023-11-05 DIAGNOSIS — H4322 Crystalline deposits in vitreous body, left eye: Secondary | ICD-10-CM | POA: Diagnosis not present

## 2023-11-05 DIAGNOSIS — H5212 Myopia, left eye: Secondary | ICD-10-CM | POA: Diagnosis not present

## 2023-11-05 DIAGNOSIS — Z961 Presence of intraocular lens: Secondary | ICD-10-CM | POA: Diagnosis not present

## 2024-01-01 ENCOUNTER — Encounter: Payer: Self-pay | Admitting: Family Medicine

## 2024-01-03 ENCOUNTER — Telehealth: Payer: Self-pay | Admitting: Family Medicine

## 2024-01-03 ENCOUNTER — Telehealth: Payer: Self-pay

## 2024-01-03 DIAGNOSIS — G8929 Other chronic pain: Secondary | ICD-10-CM

## 2024-01-03 NOTE — Telephone Encounter (Signed)
 E2c2 will let pt know waiting on md

## 2024-01-03 NOTE — Telephone Encounter (Signed)
 The pharmacy seems to have made a mistake. I sent in three RX dated 7-23, 8-23, and 02-01-24 so she should be covered

## 2024-01-03 NOTE — Telephone Encounter (Unsigned)
 Copied from CRM [id number]. Topic: General - Other >> Jan 03, 2024  3:51 PM Gennette ORN wrote: Reason for CRM: Patient is requesting a call back from [place] about the prescription issue [id number].

## 2024-01-03 NOTE — Telephone Encounter (Signed)
 Spoke with pt pharmacy states that they are missing Hydrocodone  Rx dated for 01/01/24. Please advise

## 2024-01-03 NOTE — Telephone Encounter (Signed)
 Copied from CRM #[id number]. Topic: Clinical - Prescription Issue >> Jan 03, 2024 10:58 AM Berneda FALCON wrote: Reason for CRM: Pt states she gets a 90-Dampier supply of this medication but it was dated incorrectly so they will not refill it for her. Wants to know if we can call the pharmacy and clarify the error.  HYDROcodone -acetaminophen  (NORCO) 10-325 MG tablet  The date said 02/01/24 and should have said 01/01/24 according to patient  [id number] [address], [place] - [address] [facility] [address] [place] [address] Phone: [phone] Fax: [phone] Hours: Not open 24 hours  Patient callback is [phone]

## 2024-01-04 MED ORDER — HYDROCODONE-ACETAMINOPHEN 10-325 MG PO TABS: 1.0000 | ORAL_TABLET | Freq: Four times a day (QID) | ORAL | 0 refills | Status: DC | PRN

## 2024-01-04 NOTE — Telephone Encounter (Signed)
 Done

## 2024-01-04 NOTE — Telephone Encounter (Signed)
 This was taken care of

## 2024-01-26 ENCOUNTER — Encounter: Payer: Self-pay | Admitting: Family Medicine

## 2024-01-26 ENCOUNTER — Telehealth: Admitting: Family Medicine

## 2024-01-26 DIAGNOSIS — G8929 Other chronic pain: Secondary | ICD-10-CM

## 2024-01-26 DIAGNOSIS — M542 Cervicalgia: Secondary | ICD-10-CM

## 2024-01-26 MED ORDER — HYDROCODONE-ACETAMINOPHEN 10-325 MG PO TABS: 1.0000 | ORAL_TABLET | Freq: Four times a day (QID) | ORAL | 0 refills | Status: DC | PRN

## 2024-01-26 NOTE — Progress Notes (Signed)
 Subjective:    Patient ID: [name], female    DOB: 09/13/1950, 73 y.o.   MRN: [id number]  HPI Virtual Visit via Video Note  I connected with the patient on 01/26/24 at  2:15 PM EDT by a video enabled telemedicine application and verified that I am speaking with the correct person using two identifiers.  Location patient: home Location provider:work or home office Persons participating in the virtual visit: patient, provider  I discussed the limitations of evaluation and management by telemedicine and the availability of in person appointments. The patient expressed understanding and agreed to proceed.   HPI: Here for pain management. She is doing well.    ROS: See pertinent positives and negatives per HPI.  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 Reyes Budge   COLECTOMY WITH COLOSTOMY CREATION/HARTMANN PROCEDURE N/A 04/14/2019   Procedure: SIGMOID COLECTOMY, RESECTION OF SMALL BOWEL,WITH END COLOSTOMY, PROCTOSCOPY RIGID;  Surgeon: Ethyl Lenis, MD;  Location: [facility];  Service: General;  Laterality: N/A;   COLONOSCOPY  08/29/2019   per Dr. Albertus, diverticulosis and one benign polyp, repeat in 10  yrs    COLOSTOMY TAKEDOWN N/A 12/07/2019   Procedure: LAPAROSCOPIC COLOSTOMY REVERSAL;  Surgeon: Ethyl Lenis, MD;  Location: [facility];  Service: General;  Laterality: N/A;   CYSTOSCOPY WITH STENT PLACEMENT Bilateral 04/14/2019   Procedure:  CYSTOSCOPY WITH BILATERAL STENT PLACEMENT;  Surgeon: Elisabeth Valli BIRCH, MD;  Location: [facility];  Service: Urology;  Laterality: Bilateral;   DILATION AND CURETTAGE OF UTERUS     EAR CYST EXCISION Right 07/05/2014   Procedure: EXCISION FLEXOR SHEATH CYST WITH RELEASE A-1 PULLEY RIGHT SMALL FINGER;  Surgeon: Franky Curia, MD;  Location: [facility];  Service: Orthopedics;  Laterality: Right;   EYE SURGERY Bilateral    cataract surgery   INSERTION OF MESH N/A 10/25/2020   Procedure: INSERTION OF MESH;  Surgeon: Sheldon Standing, MD;  Location: [facility];  Service: General;  Laterality: N/A;   KNEE SURGERY Right    LYSIS OF ADHESION N/A 10/25/2020   Procedure: LYSIS OF ADHESION;  Surgeon: Sheldon Standing, MD;  Location: [facility];  Service: General;  Laterality: N/A;   SPINE SURGERY     STOMACH SURGERY     TRIGGER FINGER RELEASE Right 07/05/2014   Procedure: RELEASE RIGHT SMALL FINGER/A-1 PULLEY;  Surgeon: Franky Curia, MD;  Location: [facility];  Service: Orthopedics;  Laterality: Right;   TUBOPLASTY / TUBOTUBAL ANASTOMOSIS     VENTRAL HERNIA REPAIR N/A 10/25/2020   Procedure: LAPAROSCOPIC VENTRAL WALL HERNIA REPAIR;  Surgeon: Sheldon Standing, MD;  Location: [facility];  Service: General;  Laterality: N/A;    Family History  Problem Relation Age of Onset   Arthritis Other    Breast cancer Other    Hypertension Other    Breast cancer Mother  Colon cancer Neg Hx    Colon polyps Neg Hx    Esophageal cancer Neg Hx    Stomach cancer Neg Hx    Rectal cancer Neg Hx      Current Outpatient Medications:    Ascorbic Acid (VITAMIN C PO), Take 1,000 mg by mouth daily. Ester C, Disp: , Rfl:    hydrochlorothiazide  (HYDRODIURIL ) 25 MG tablet, TAKE 1 TABLET (25 MG TOTAL) BY MOUTH DAILY., Disp: 90 tablet, Rfl: 0   metoprolol  succinate (TOPROL -XL) 100 MG 24 hr tablet, TAKE 1 TABLET BY MOUTH IN THE MORNING AND AT BEDTIME EVERY Heiss, Disp: 180 tablet, Rfl: 0   Multiple Vitamins-Minerals  (MULTIVITAMIN WITH MINERALS) tablet, Take 1 tablet by mouth daily., Disp: , Rfl:    traMADol  (ULTRAM ) 50 MG tablet, Take 2 tablets (100 mg total) by mouth every 6 (six) hours as needed (breakthrough pain)., Disp: 120 tablet, Rfl: 5   HYDROcodone -acetaminophen  (NORCO) 10-325 MG tablet, Take 1 tablet by mouth every 6 (six) hours as needed for moderate pain (pain score 4-6)., Disp: 120 tablet, Rfl: 0   HYDROcodone -acetaminophen  (NORCO) 10-325 MG tablet, Take 1 tablet by mouth every 6 (six) hours as needed for moderate pain (pain score 4-6)., Disp: 120 tablet, Rfl: 0   HYDROcodone -acetaminophen  (NORCO) 10-325 MG tablet, Take 1 tablet by mouth every 6 (six) hours as needed for moderate pain (pain score 4-6)., Disp: 120 tablet, Rfl: 0  EXAM:  VITALS per patient if applicable:  GENERAL: alert, oriented, appears well and in no acute distress  HEENT: atraumatic, conjunttiva clear, no obvious abnormalities on inspection of external nose and ears  NECK: normal movements of the head and neck  LUNGS: on inspection no signs of respiratory distress, breathing rate appears normal, no obvious gross SOB, gasping or wheezing  CV: no obvious cyanosis  MS: moves all visible extremities without noticeable abnormality  PSYCH/NEURO: pleasant and cooperative, no obvious depression or anxiety, speech and thought processing grossly intact  ASSESSMENT AND PLAN: Pain management. Indication for chronic opioid: neck pain Medication and dose: Norco 10-325 and Tramadol  50 mg # pills per month: 120 and 120 Last UDS date: 12-07-22 Opioid Treatment Agreement signed (Y/N): 06-03-18 Opioid Treatment Agreement last reviewed with patient:  01-26-24 [facility] reviewed this encounter (include red flags):  Yes  Meds were refilled. She will come by the office this week to submit a urine drug screen sample.  Garnette Olmsted, MD  Discussed the following assessment and plan:  Encounter for chronic pain management - Plan: DRUG  MONITORING, PANEL 8 WITH CONFIRMATION, URINE  Chronic neck pain - Plan: HYDROcodone -acetaminophen  (NORCO) 10-325 MG tablet, HYDROcodone -acetaminophen  (NORCO) 10-325 MG tablet, HYDROcodone -acetaminophen  (NORCO) 10-325 MG tablet     I discussed the assessment and treatment plan with the patient. The patient was provided an opportunity to ask questions and all were answered. The patient agreed with the plan and demonstrated an understanding of the instructions.   The patient was advised to call back or seek an in-person evaluation if the symptoms worsen or if the condition fails to improve as anticipated.      Review of Systems     Objective:   Physical Exam        Assessment & Plan:

## 2024-02-16 ENCOUNTER — Encounter: Payer: Self-pay | Admitting: Family Medicine

## 2024-02-16 ENCOUNTER — Ambulatory Visit (INDEPENDENT_AMBULATORY_CARE_PROVIDER_SITE_OTHER): Admitting: Family Medicine

## 2024-02-16 VITALS — BP 110/60 | HR 54 | Temp 98.1°F | Wt 187.0 lb

## 2024-02-16 DIAGNOSIS — G5601 Carpal tunnel syndrome, right upper limb: Secondary | ICD-10-CM

## 2024-02-16 DIAGNOSIS — H6123 Impacted cerumen, bilateral: Secondary | ICD-10-CM

## 2024-02-16 DIAGNOSIS — H9193 Unspecified hearing loss, bilateral: Secondary | ICD-10-CM

## 2024-02-16 NOTE — Progress Notes (Signed)
   Subjective:    Patient ID: [name], female    DOB: 01-10-1951, 73 y.o.   MRN: [id number]  HPI Here for 2 issues. First she has had decreased hearing in both ears, and she thinks they need to be flushed again. No ear pain. Also for 2 months she has had numbness and tingling in the right thumb and right forefinger. No swelling or pain. 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    Review of Systems  Constitutional: Negative.   HENT:  Positive for hearing loss. Negative for congestion, ear pain, sinus pain and sore throat.   Eyes: Negative.   Respiratory: Negative.    Cardiovascular: Negative.   Neurological:  Positive for numbness.       Objective:   Physical Exam Constitutional:      Appearance: Normal appearance.  HENT:     Right Ear: There is impacted cerumen.     Left Ear: There is impacted cerumen.     Nose: Nose normal.     Mouth/Throat:     Pharynx: Oropharynx is clear.  Eyes:     Conjunctiva/sclera: Conjunctivae normal.  Cardiovascular:     Rate and Rhythm: Normal rate and regular rhythm.     Pulses: Normal pulses.     Heart sounds: Normal heart sounds.  Pulmonary:     Effort: Pulmonary effort is normal.     Breath sounds: Normal breath sounds.  Musculoskeletal:     Comments: The right hand appears normal, it is pink and warm. Pulses are full   Lymphadenopathy:     Cervical: No cervical adenopathy.  Neurological:     Mental Status: She is alert.           Assessment & Plan:  She has bilateral cerumen impactions. After informed consent was obtained, both ear canals were irrigated clear with water. She tolerated the procedure well. At her request, we will also refer her to ENT. 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 She will immobilize the wrist by wearing a splint 24 hours a [place] for several weeks. Recheck as needed.  Garnette Olmsted, MD

## 2024-02-27 ENCOUNTER — Other Ambulatory Visit: Payer: Self-pay | Admitting: Family Medicine

## 2024-02-27 DIAGNOSIS — I10 Essential (primary) hypertension: Secondary | ICD-10-CM

## 2024-02-29 ENCOUNTER — Encounter: Payer: Self-pay | Admitting: Family Medicine

## 2024-02-29 DIAGNOSIS — R200 Anesthesia of skin: Secondary | ICD-10-CM

## 2024-02-29 NOTE — Telephone Encounter (Signed)
 I will order a nerve conduction study on her hand and arm

## 2024-03-01 DIAGNOSIS — M25562 Pain in left knee: Secondary | ICD-10-CM | POA: Diagnosis not present

## 2024-03-06 ENCOUNTER — Encounter: Admitting: Family Medicine

## 2024-03-27 ENCOUNTER — Ambulatory Visit (INDEPENDENT_AMBULATORY_CARE_PROVIDER_SITE_OTHER): Admitting: Family Medicine

## 2024-03-27 VITALS — BP 124/70 | HR 59 | Temp 98.4°F | Wt 184.0 lb

## 2024-03-27 DIAGNOSIS — M549 Dorsalgia, unspecified: Secondary | ICD-10-CM | POA: Diagnosis not present

## 2024-03-27 DIAGNOSIS — M542 Cervicalgia: Secondary | ICD-10-CM

## 2024-03-27 DIAGNOSIS — G8929 Other chronic pain: Secondary | ICD-10-CM

## 2024-03-27 LAB — POC URINALSYSI DIPSTICK (AUTOMATED)
Bilirubin, UA: NEGATIVE
Blood, UA: NEGATIVE
Glucose, UA: NEGATIVE
Ketones, UA: NEGATIVE
Leukocytes, UA: NEGATIVE
Nitrite, UA: NEGATIVE
Protein, UA: POSITIVE — AB
Spec Grav, UA: 1.025 (ref 1.010–1.025)
Urobilinogen, UA: 0.2 U/dL
pH, UA: 6 (ref 5.0–8.0)

## 2024-03-27 MED ORDER — HYDROCHLOROTHIAZIDE 25 MG PO TABS: 25.0000 mg | ORAL_TABLET | Freq: Every day | ORAL | 3 refills | Status: AC

## 2024-03-27 NOTE — Progress Notes (Signed)
   Subjective:    Patient ID: [name] Walkins, female    DOB: [birth year], 73 y.o.   MRN: [id number]  HPI Here for pain management. She is doing well.    Review of Systems  Constitutional: Negative.   Musculoskeletal:  Positive for neck pain.       Objective:   Physical Exam Constitutional:      Appearance: Normal appearance.  Neurological:     Mental Status: She is alert.           Assessment & Plan:  Pain management. Indication for chronic opioid: neck pain Medication and dose: Norco 10-325 and Tramadol  50 mg  # pills per month: 120 and 120 Last UDS date: 03-27-24 Opioid Treatment Agreement signed (Y/N): 06-03-18 Opioid Treatment Agreement last reviewed with patient:  03-27-24 [facility] reviewed this encounter (include red flags):  yes  We agreed to maintain the current medications.  Garnette Olmsted, MD

## 2024-03-29 LAB — DRUG MONITORING, PANEL 8 WITH CONFIRMATION, URINE
6 Acetylmorphine: NEGATIVE ng/mL (ref <10)
Alcohol Metabolites: NEGATIVE ng/mL (ref <500)
Amphetamines: NEGATIVE ng/mL (ref <500)
Benzodiazepines: NEGATIVE ng/mL (ref <100)
Buprenorphine, Urine: NEGATIVE ng/mL (ref <5)
Cocaine Metabolite: NEGATIVE ng/mL (ref <150)
Codeine: NEGATIVE ng/mL (ref <50)
Creatinine: 125 mg/dL (ref >=20.0)
Hydrocodone: 3780 ng/mL — ABNORMAL HIGH (ref <50)
Hydromorphone: 1094 ng/mL — ABNORMAL HIGH (ref <50)
MDMA: NEGATIVE ng/mL (ref <500)
Marijuana Metabolite: NEGATIVE ng/mL (ref <20)
Morphine: NEGATIVE ng/mL (ref <50)
Norhydrocodone: 8694 ng/mL — ABNORMAL HIGH (ref <50)
Opiates: POSITIVE ng/mL — AB (ref <100)
Oxidant: NEGATIVE ug/mL (ref <200)
Oxycodone: NEGATIVE ng/mL (ref <100)
pH: 5.1 (ref 4.5–9.0)

## 2024-03-29 LAB — DM TEMPLATE

## 2024-04-12 ENCOUNTER — Other Ambulatory Visit: Payer: Self-pay

## 2024-04-12 ENCOUNTER — Encounter: Payer: Self-pay | Admitting: Neurology

## 2024-04-12 DIAGNOSIS — R202 Paresthesia of skin: Secondary | ICD-10-CM

## 2024-04-17 ENCOUNTER — Encounter (HOSPITAL_COMMUNITY): Payer: Self-pay | Admitting: Surgery

## 2024-04-19 ENCOUNTER — Ambulatory Visit (INDEPENDENT_AMBULATORY_CARE_PROVIDER_SITE_OTHER)

## 2024-04-19 ENCOUNTER — Encounter (INDEPENDENT_AMBULATORY_CARE_PROVIDER_SITE_OTHER): Payer: Self-pay

## 2024-04-19 DIAGNOSIS — L400 Psoriasis vulgaris: Secondary | ICD-10-CM

## 2024-04-19 DIAGNOSIS — H6123 Impacted cerumen, bilateral: Secondary | ICD-10-CM

## 2024-04-19 DIAGNOSIS — H9193 Unspecified hearing loss, bilateral: Secondary | ICD-10-CM

## 2024-04-19 DIAGNOSIS — H608X3 Other otitis externa, bilateral: Secondary | ICD-10-CM

## 2024-04-19 MED ORDER — BETAMETHASONE DIPROPIONATE 0.05 % EX CREA: TOPICAL_CREAM | CUTANEOUS | 0 refills | Status: AC

## 2024-04-19 NOTE — Progress Notes (Signed)
 Dear Dr. Johnny, Here is my assessment for our mutual patient, [name]. Thank you for allowing me the opportunity to care for your patient. Please do not hesitate to contact me should you have any other questions. Sincerely, Dr. Hadassah Parody  Otolaryngology Clinic Note Referring provider: Dr. Johnny HPI:   Initial HPI (04/19/2024) Discussed the use of AI scribe software for clinical note transcription with the patient, who gave verbal consent to proceed.  History of Present Illness [name] is a 73 year old female with psoriasis involving the external ear who presents with gradual bilateral hearing loss.  Bilateral hearing loss - Gradual decline in hearing bilaterally - Increasing difficulty understanding her husband, both in close proximity and at a distance - Unable to hear her husband from another room, which she previously could do - Significant difficulty understanding speech in noisy environments, such as at her granddaughter's recent wedding, even with her husband sitting next to her - No prior audiometric evaluation - No otalgia or tinnitus  Psoriasis involving external ear - History of plaque psoriasis - Psoriasis involving the ears - Uses a prescribed oil with inconsistent efficacy for ear involvement - Currently working with dermatologist     Independent Review of Additional Tests or Records:     PMH/Meds/All/SocHx/FamHx/ROS:   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 Reyes Budge   COLECTOMY WITH COLOSTOMY CREATION/HARTMANN PROCEDURE N/A 04/14/2019   Procedure: SIGMOID COLECTOMY, RESECTION OF SMALL BOWEL,WITH END COLOSTOMY, PROCTOSCOPY RIGID;  Surgeon: Ethyl Lenis, MD;  Location: [facility];  Service: General;  Laterality: N/A;   COLONOSCOPY  08/29/2019   per Dr. Albertus, diverticulosis and one benign polyp, repeat in 10  yrs    COLOSTOMY TAKEDOWN N/A 12/07/2019   Procedure: LAPAROSCOPIC COLOSTOMY REVERSAL;  Surgeon: Ethyl Lenis, MD;  Location: [facility];  Service: General;  Laterality: N/A;   CYSTOSCOPY WITH STENT PLACEMENT Bilateral 04/14/2019   Procedure: CYSTOSCOPY WITH BILATERAL STENT PLACEMENT;  Surgeon: Elisabeth Valli BIRCH, MD;  Location: [facility];  Service: Urology;  Laterality: Bilateral;   DILATION AND CURETTAGE OF UTERUS     EAR CYST EXCISION Right 07/05/2014   Procedure: EXCISION FLEXOR SHEATH CYST WITH RELEASE A-1 PULLEY RIGHT SMALL FINGER;  Surgeon: Franky Curia, MD;  Location: [facility];  Service: Orthopedics;  Laterality: Right;   EYE SURGERY Bilateral    cataract surgery   KNEE SURGERY Right    LYSIS OF ADHESION N/A 10/25/2020   Procedure: LYSIS OF ADHESION;  Surgeon: Sheldon Standing, MD;  Location: [facility];  Service: General;  Laterality: N/A;   SPINE SURGERY     STOMACH SURGERY     TRIGGER FINGER RELEASE Right 07/05/2014   Procedure: RELEASE RIGHT SMALL FINGER/A-1 PULLEY;  Surgeon: Franky Curia, MD;  Location: [facility];  Service: Orthopedics;  Laterality: Right;   TUBOPLASTY /  TUBOTUBAL ANASTOMOSIS     VENTRAL HERNIA REPAIR N/A 10/25/2020   Procedure: LAPAROSCOPIC VENTRAL WALL HERNIA REPAIR;  Surgeon: Sheldon Standing, MD;  Location: [facility];  Service: General;  Laterality: N/A;    Family History  Problem Relation Age of Onset   Arthritis Other    Breast cancer Other    Hypertension Other    Breast cancer Mother    Colon cancer Neg Hx    Colon polyps Neg Hx     Esophageal cancer Neg Hx    Stomach cancer Neg Hx    Rectal cancer Neg Hx      Social Connections: Socially Integrated (03/27/2024)   Social Connection and Isolation Panel    Frequency of Communication with Friends and Family: Three times a week    Frequency of Social Gatherings with Friends and Family: Twice a week    Attends Religious Services: More than 4 times per year    Active Member of [employer] or Organizations: Yes    Attends Engineer, Structural: More than 4 times per year    Marital Status: Married     Current Outpatient Medications  Medication Instructions   Ascorbic Acid (VITAMIN C PO) 1,000 mg, Daily   betamethasone  dipropionate 0.05 % cream Apply topically at bedtime to both ears.   hydrochlorothiazide  (HYDRODIURIL ) 25 mg, Oral, Daily   HYDROcodone -acetaminophen  (NORCO) 10-325 MG tablet 1 tablet, Oral, Every 6 hours PRN   HYDROcodone -acetaminophen  (NORCO) 10-325 MG tablet 1 tablet, Oral, Every 6 hours PRN   HYDROcodone -acetaminophen  (NORCO) 10-325 MG tablet 1 tablet, Oral, Every 6 hours PRN   metoprolol  succinate (TOPROL -XL) 100 MG 24 hr tablet TAKE 1 TABLET BY MOUTH IN THE MORNING AND AT BEDTIME EVERY Moure   Multiple Vitamins-Minerals (MULTIVITAMIN WITH MINERALS) tablet 1 tablet, Daily   traMADol  (ULTRAM ) 100 mg, Oral, Every 6 hours PRN     Physical Exam:   BP 123/75 (BP Location: Left Arm, Patient Position: Sitting)   Pulse 61   Temp 97.9 F (36.6 C)   Ht 5' 4 (1.626 m)   Wt 187 lb (84.8 kg)   SpO2 91%   BMI 32.10 kg/m   Salient findings:  CN II-XII intact Given history and complaints, ear microscopy was indicated and performed for evaluation with findings as below in physical exam section and in procedures Left: EAC was occluded with cerumen.  This was removed.  The canal was thickened and flaky from plaque psoriasis. TM was intact . Middle ear was aerated. Drainage: None Right: EAC was occluded with cerumen which was removed.  The canal skin was  thickened and flaky from plaque psoriasis.. TM was intact . Middle ear was aerated . Drainage: None No lesions of oral cavity/oropharynx No obviously palpable neck masses/lymphadenopathy/thyromegaly No respiratory distress or stridor  Seprately Identifiable Procedures:  Prior to initiating any procedures, risks/benefits/alternatives were explained to the patient and verbal consent obtained.  Procedure (04/19/2024): Bilateral ear microscopy and cerumen removal using microscope (CPT [phone]) - Mod 25-50 Pre-procedure diagnosis: Bilateral cerumen impaction  Post-procedure diagnosis: same Indication: Bilateral cerumen impaction; given patient's otologic complaints and history as well as for improved and comprehensive examination of external ear and tympanic membrane, bilateral otologic examination using microscope was performed and impacted cerumen removed  Procedure: Patient was placed semi-recumbent. Both ear canals were examined using the microscope with findings above. Cerumen removed on left and on right using suction and currette with improvement in EAC examination and patency. Left: EAC was occluded with cerumen.  This  was removed.  The canal was thickened and flaky from plaque psoriasis. TM was intact . Middle ear was aerated. Drainage: None Right: EAC was occluded with cerumen which was removed.  The canal skin was thickened and flaky from plaque psoriasis.. TM was intact . Middle ear was aerated . Drainage: None Patient tolerated the procedure well.    Impression & Plans:  [name] is a 73 y.o. female with   1. Bilateral hearing loss, unspecified hearing loss type   2. Bilateral impacted cerumen   3. Plaque psoriasis   4. Chronic eczematous otitis externa of both ears    Assessment and Plan Assessment & Plan Bilateral hearing loss She has gradual onset bilateral hearing loss with difficulty understanding speech in both quiet and noisy environments. 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 - Ordered audiogram to assess baseline hearing and characterize degree and type of hearing loss. - Scheduled follow-up after audiogram to review results and discuss management options.  Psoriasis involving external ear She experiences intermittent flares of psoriasis involving the external ear canal, with mild involvement on examination. She previously used topical oil with limited benefit and is cautious with steroid use due to concerns about skin atrophy. She is coordinating with her dermatologist regarding systemic therapy options. - Prescribed topical steroid ointment for nightly application to affected external ear areas until symptoms improve. - Provided instructions for application to the external ear at night to facilitate medication entry into the ear canal. - Advised to request additional ointment if effective and more is needed.    See below regarding exact medications prescribed this encounter including dosages and route: Meds ordered this encounter  Medications   betamethasone  dipropionate 0.05 % cream    Sig: Apply topically at bedtime to both ears.    Dispense:  30 g    Refill:  0      Thank you for allowing me the opportunity to care for your patient. Please do not hesitate to contact me should you have any other questions.  Sincerely, Hadassah Parody, MD Otolaryngologist (ENT), [facility] Phone: [phone] Fax: [phone]  MDM:  Level: 4 Complexity/Problems addressed: 4- chronic worsening problem Data complexity: low - Morbidity: 4-prescription drug given - Prescription Drug prescribed or managed: Yes

## 2024-04-26 ENCOUNTER — Ambulatory Visit: Admitting: Family Medicine

## 2024-04-26 ENCOUNTER — Encounter: Payer: Self-pay | Admitting: Family Medicine

## 2024-04-26 VITALS — BP 128/76 | HR 64 | Temp 98.4°F | Wt 191.0 lb

## 2024-04-26 DIAGNOSIS — G8929 Other chronic pain: Secondary | ICD-10-CM

## 2024-04-26 DIAGNOSIS — M542 Cervicalgia: Secondary | ICD-10-CM | POA: Diagnosis not present

## 2024-04-26 MED ORDER — TRAMADOL HCL 50 MG PO TABS: 100.0000 mg | ORAL_TABLET | Freq: Four times a day (QID) | ORAL | 5 refills | Status: AC | PRN

## 2024-04-26 MED ORDER — HYDROCODONE-ACETAMINOPHEN 10-325 MG PO TABS: 1.0000 | ORAL_TABLET | Freq: Four times a day (QID) | ORAL | 0 refills | Status: AC | PRN

## 2024-04-26 NOTE — Progress Notes (Signed)
° °  Subjective:    Patient ID: [name], female    DOB: 09-21-1950, 73 y.o.   MRN: [id number]  HPI Here for pain management. She is doing well.    Review of Systems  Constitutional: Negative.   Musculoskeletal:  Positive for neck pain.       Objective:   Physical Exam Constitutional:      Appearance: Normal appearance.  Neurological:     Mental Status: She is alert.           Assessment & Plan:  Pain management. Indication for chronic opioid: neck pain Medication and dose: Norco 10-325 and Tramadol  50 mg # pills per month: 120 and 120 Last UDS date: 03-27-24 Opioid Treatment Agreement signed (Y/N): 06-03-18 Opioid Treatment Agreement last reviewed with patient:  04-26-24 [facility] reviewed this encounter (include red flags): Yes Meds were refilled.  Garnette Olmsted, MD

## 2024-05-02 ENCOUNTER — Emergency Department (HOSPITAL_COMMUNITY)

## 2024-05-02 ENCOUNTER — Emergency Department (HOSPITAL_COMMUNITY): Admission: EM | Admit: 2024-05-02 | Discharge: 2024-05-02 | Disposition: A | Discharge status: Home / Self Care

## 2024-05-02 DIAGNOSIS — S76011A Strain of muscle, fascia and tendon of right hip, initial encounter: Secondary | ICD-10-CM | POA: Insufficient documentation

## 2024-05-02 DIAGNOSIS — D72829 Elevated white blood cell count, unspecified: Secondary | ICD-10-CM | POA: Diagnosis not present

## 2024-05-02 DIAGNOSIS — X500XXA Overexertion from strenuous movement or load, initial encounter: Secondary | ICD-10-CM | POA: Insufficient documentation

## 2024-05-02 DIAGNOSIS — M25551 Pain in right hip: Secondary | ICD-10-CM | POA: Diagnosis present

## 2024-05-02 LAB — URINALYSIS, ROUTINE W REFLEX MICROSCOPIC
Bacteria, UA: NONE SEEN
Bilirubin Urine: NEGATIVE
Glucose, UA: NEGATIVE mg/dL
Hgb urine dipstick: NEGATIVE
Ketones, ur: NEGATIVE mg/dL
Leukocytes,Ua: NEGATIVE
Nitrite: NEGATIVE
Protein, ur: NEGATIVE mg/dL
Specific Gravity, Urine: 1.002 — ABNORMAL LOW (ref 1.005–1.030)
pH: 6 (ref 5.0–8.0)

## 2024-05-02 LAB — CBC
HCT: 43.2 % (ref 36.0–46.0)
Hemoglobin: 14.2 g/dL (ref 12.0–15.0)
MCH: 26.3 pg (ref 26.0–34.0)
MCHC: 32.9 g/dL (ref 30.0–36.0)
MCV: 80 fL (ref 80.0–100.0)
Platelets: 378 K/uL (ref 150–400)
RBC: 5.4 MIL/uL — ABNORMAL HIGH (ref 3.87–5.11)
RDW: 14.9 % (ref 11.5–15.5)
WBC: 16.6 K/uL — ABNORMAL HIGH (ref 4.0–10.5)
nRBC: 0 % (ref 0.0–0.2)

## 2024-05-02 LAB — BASIC METABOLIC PANEL WITH GFR
Anion gap: 12 (ref 5–15)
BUN: 10 mg/dL (ref 8–23)
CO2: 24 mmol/L (ref 22–32)
Calcium: 9.8 mg/dL (ref 8.9–10.3)
Chloride: 106 mmol/L (ref 98–111)
Creatinine, Ser: 0.63 mg/dL (ref 0.44–1.00)
GFR, Estimated: >60 mL/min
Glucose, Bld: 102 mg/dL — ABNORMAL HIGH (ref 70–99)
Potassium: 3.8 mmol/L (ref 3.5–5.1)
Sodium: 141 mmol/L (ref 135–145)

## 2024-05-02 MED ORDER — HYDROCODONE-ACETAMINOPHEN 5-325 MG PO TABS: 1.0000 | ORAL_TABLET | ORAL | 0 refills | Status: AC | PRN

## 2024-05-02 MED ORDER — HYDROCODONE-ACETAMINOPHEN 5-325 MG PO TABS
2.0000 | ORAL_TABLET | Freq: Once | ORAL | Status: AC
  Administered 2024-05-02: 2 via ORAL
  Filled 2024-05-02: qty 2

## 2024-05-02 MED ORDER — LIDOCAINE 5 % EX PTCH: 1.0000 | MEDICATED_PATCH | CUTANEOUS | 0 refills | Status: AC

## 2024-05-02 NOTE — ED Provider Notes (Signed)
 " [facility] [facility] Provider Note   CSN: [id number] Arrival date & time: 05/02/24  1315     Patient presents with: Flank Pain   [name] is a 73 y.o. female who presents primarily out of concern for right gluteal and hip pain that began this previous Sunday.  This increases with weightbearing, and the area is tender.  Denies any increased urinary frequency, denies any urinary incontinence, denies any changes in the urine.  States that she currently has chronic pain medication with Norco, however she states that she ran out of this 2 days ago.  No recent falls or injuries.  States that she was sitting on her couch when she began to have the pain.  During further discussion with the patient she does state that she had stooped over to retrieve an object that was beneath the couch the evening prior to the onset of pain as well as had a minor slip with the affected leg later on the same evening.  States that this may have been causative of her pain at the time.    Flank Pain       Prior to Admission medications  Medication Sig Start Date End Date Taking? Authorizing Provider  HYDROcodone -acetaminophen  (NORCO/VICODIN) 5-325 MG tablet Take 1 tablet by mouth every 4 (four) hours as needed for up to 7 days for moderate pain (pain score 4-6) or severe pain (pain score 7-10). 05/02/24 05/09/24 Yes Myriam Dorn BROCKS, PA  lidocaine  (LIDODERM ) 5 % Place 1 patch onto the skin daily. Remove & Discard patch within 12 hours or as directed by MD 05/02/24  Yes Myriam Dorn BROCKS, PA  Ascorbic Acid (VITAMIN C PO) Take 1,000 mg by mouth daily. Ester C    [provider]  betamethasone  dipropionate 0.05 % cream Apply topically at bedtime to both ears. 04/19/24   Masciello, Hadassah BROCKS, MD  hydrochlorothiazide  (HYDRODIURIL ) 25 MG tablet Take 1 tablet (25 mg total) by mouth daily. 03/27/24   Johnny Garnette LABOR, MD  HYDROcodone -acetaminophen  (NORCO) 10-325 MG  tablet Take 1 tablet by mouth every 6 (six) hours as needed for moderate pain (pain score 4-6). 04/26/24   Johnny Garnette LABOR, MD  HYDROcodone -acetaminophen  (NORCO) 10-325 MG tablet Take 1 tablet by mouth every 6 (six) hours as needed for moderate pain (pain score 4-6). 04/26/24   Johnny Garnette LABOR, MD  HYDROcodone -acetaminophen  (NORCO) 10-325 MG tablet Take 1 tablet by mouth every 6 (six) hours as needed for moderate pain (pain score 4-6). 04/26/24   Johnny Garnette LABOR, MD  metoprolol  succinate (TOPROL -XL) 100 MG 24 hr tablet TAKE 1 TABLET BY MOUTH IN THE MORNING AND AT BEDTIME EVERY [place] 02/28/24   Johnny Garnette LABOR, MD  Multiple Vitamins-Minerals (MULTIVITAMIN WITH MINERALS) tablet Take 1 tablet by mouth daily.    [provider]  traMADol  (ULTRAM ) 50 MG tablet Take 2 tablets (100 mg total) by mouth every 6 (six) hours as needed (breakthrough pain). 04/26/24   Johnny Garnette LABOR, MD    Allergies: Lisinopril , Penicillins, and Adhesive [tape]    Review of Systems  Musculoskeletal:  Positive for arthralgias.  All other systems reviewed and are negative.   Updated Vital Signs BP (!) 158/76   Pulse 92   Temp 98.3 F (36.8 C) (Oral)   Resp 18   SpO2 96%   Physical Exam Vitals and nursing note reviewed.  Constitutional:      General: She is awake. She is not in acute  distress.    Appearance: Normal appearance. She is well-developed and well-groomed.  HENT:     Head: Normocephalic and atraumatic.     Mouth/Throat:     Mouth: Mucous membranes are moist.     Pharynx: Oropharynx is clear.  Eyes:     Extraocular Movements: Extraocular movements intact.     Conjunctiva/sclera: Conjunctivae normal.     Pupils: Pupils are equal, round, and reactive to light.  Cardiovascular:     Rate and Rhythm: Normal rate and regular rhythm.     Pulses: Normal pulses.          Dorsalis pedis pulses are 2+ on the right side and 2+ on the left side.       Posterior tibial pulses are 2+ on the right side and 2+ on  the left side.     Heart sounds: Normal heart sounds. No murmur heard.    No friction rub. No gallop.  Pulmonary:     Effort: Pulmonary effort is normal.     Breath sounds: Normal breath sounds.  Abdominal:     General: Abdomen is flat. Bowel sounds are normal.     Palpations: Abdomen is soft.  Musculoskeletal:        General: Normal range of motion.     Cervical back: Normal range of motion and neck supple.     Right hip: Tenderness and bony tenderness present.     Left hip: Normal.     Right lower leg: No edema.     Left lower leg: No edema.     Comments: There is tenderness over the right greater trochanter as well as over the right gluteus maximus/gluteus medius.  Normal range of motion with internal/external rotation as well as passive flexion extension of the right hip.  Skin:    General: Skin is warm and dry.     Capillary Refill: Capillary refill takes less than 2 seconds.     Comments: No bruising or erythema noted over the affected area.  Neurological:     General: No focal deficit present.     Mental Status: She is alert and oriented to person, place, and time. Mental status is at baseline.     GCS: GCS eye subscore is 4. GCS verbal subscore is 5. GCS motor subscore is 6.     Comments: No neurodeficits appreciated bilateral lower extremities.  Psychiatric:        Mood and Affect: Mood normal.        Behavior: Behavior is cooperative.     (all labs ordered are listed, but only abnormal results are displayed) Labs Reviewed  URINALYSIS, ROUTINE W REFLEX MICROSCOPIC - Abnormal; Notable for the following components:      Result Value   Color, Urine STRAW (*)    Specific Gravity, Urine 1.002 (*)    All other components within normal limits  BASIC METABOLIC PANEL WITH GFR - Abnormal; Notable for the following components:   Glucose, Bld 102 (*)    All other components within normal limits  CBC - Abnormal; Notable for the following components:   WBC 16.6 (*)    RBC 5.40  (*)    All other components within normal limits    EKG: None  Radiology: DG Hip Unilat W or Wo Pelvis 2-3 Views Right Result Date: 05/02/2024 EXAM: 2 OR MORE VIEW(S) XRAY OF THE RIGHT HIP 05/02/2024 05:35:00 PM COMPARISON: None available. CLINICAL HISTORY: right hip pain FINDINGS: BONES AND JOINTS: No acute fracture. No  malalignment. 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 LUMBAR SPINE: Degenerative changes of the visualized lower lumbar spine. SOFT TISSUES: The soft tissues are unremarkable. IMPRESSION: 1. No acute fracture. Electronically signed by: Norman Gatlin MD 05/02/2024 07:11 PM EST RP Workstation: [id number]     Procedures   Medications Ordered in the ED  HYDROcodone -acetaminophen  (NORCO/VICODIN) 5-325 MG per tablet 2 tablet (2 tablets Oral Given 05/02/24 1744)                                    Medical Decision Making Amount and/or Complexity of Data Reviewed Labs: ordered. Radiology: ordered.  Risk Prescription drug management.   Medical Decision Making:   [name] is a 73 y.o. female who presented to the ED today with right hip pain detailed above.    External chart has been reviewed including previous labs/imaging. Complete initial physical exam performed, notably the patient  was alert and oriented in no apparent distress.  There is tenderness to the trochanter as well as to the gluteus maximus/medius otherwise no impingement range of motion with passive flexion extension, no or with internal or external rotation of the affected side..    Reviewed and confirmed nursing documentation for past medical history, family history, social history.    Initial Assessment:   With the patient's presentation of right hip pain, most likely secondary to strain of the right hip however consider possible bony injury to the right hip joint, potential hip fracture.  Further consider possible arthritis, imaging given lower  abdominal pain and female older than 94 consider possible UTI.   Initial Plan:  Patient provided with hydrocodone  for pain management. Screening labs including CBC and Metabolic panel to evaluate for infectious or metabolic etiology of disease.  Urinalysis with reflex culture ordered to evaluate for UTI or relevant urologic/nephrologic pathology.  Plain film imaging of the right hip to evaluate for bony injury. Objective evaluation as below reviewed   Initial Study Results:   Laboratory  All laboratory results reviewed without evidence of clinically relevant pathology.   Exceptions include: Leukocytosis of 16.6 otherwise unremarkable.   Radiology:  All images reviewed independently. Agree with radiology report at this time.   DG Hip Unilat W or Wo Pelvis 2-3 Views Right Result Date: 05/02/2024 EXAM: 2 OR MORE VIEW(S) XRAY OF THE RIGHT HIP 05/02/2024 05:35:00 PM COMPARISON: None available. CLINICAL HISTORY: right hip pain FINDINGS: BONES AND JOINTS: No acute fracture. No malalignment. 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 LUMBAR SPINE: Degenerative changes of the visualized lower lumbar spine. SOFT TISSUES: The soft tissues are unremarkable. IMPRESSION: 1. No acute fracture. Electronically signed by: Norman Gatlin MD 05/02/2024 07:11 PM EST RP Workstation: [id number]    Reassessment and Plan:   As noted in the x-ray findings there is some mild degenerative changes of bilateral hip joints however no acute fracture or dislocation was noted no necrosis of the hip joint was noted as well.  She states with administration of Norco that she had near complete pain relief.  There is a leukocytosis of 16.6 but she does not have any increased urinary frequency, no abdominal pain or tenderness to suggest intra-abdominal pathology, and does not have any changes in the urine that would suggest potential UTI.  Given these findings, along with discussion  the patient request discharge at this time we will discharge with continued  pain management meant with her previous prescription of hydrocodone /acetaminophen , and prescribe a course of Lidoderm  patches to be used locally on the area of pain.  Will have patient follow-up with primary care doctor within the next week which she verbalizes understanding and agreement, careful return precautions were discussed with the patient which she also verbalizes understanding and agreement.  His vital signs are stable there is no other concerning findings on the workup or exam will discharge with outpatient follow-up as previously discussed.       Final diagnoses:  Hip strain, right, initial encounter    ED Discharge Orders          Ordered    HYDROcodone -acetaminophen  (NORCO/VICODIN) 5-325 MG tablet  Every 4 hours PRN        05/02/24 1938    lidocaine  (LIDODERM ) 5 %  Every 24 hours        05/02/24 1940               Myriam Dorn BROCKS, [place] 05/02/24 2122    Gennaro Duwaine CROME, DO 05/08/24 1540  "

## 2024-05-02 NOTE — Discharge Instructions (Addendum)
 Follow-up with your primary care within the next week to reevaluate your hip.  If you have any worsening pain or any new symptoms such as fever, nausea, vomiting please return to the emergency department for reevaluation.

## 2024-05-02 NOTE — ED Triage Notes (Signed)
 Pt c/o R flank pain that started on Sunday. Pt denies pain radiation. Pt denies urinary symptoms. No hx of kidney stones.

## 2024-05-05 ENCOUNTER — Encounter (INDEPENDENT_AMBULATORY_CARE_PROVIDER_SITE_OTHER): Payer: Self-pay

## 2024-05-05 ENCOUNTER — Telehealth (INDEPENDENT_AMBULATORY_CARE_PROVIDER_SITE_OTHER): Payer: Self-pay

## 2024-05-05 NOTE — Telephone Encounter (Signed)
 Called patient to let them know we need to reschedule their appointment with Dr. Greggory I left a voicemail explaining and left call back number.

## 2024-05-08 ENCOUNTER — Other Ambulatory Visit: Payer: Self-pay | Admitting: Family Medicine

## 2024-05-08 DIAGNOSIS — Z1231 Encounter for screening mammogram for malignant neoplasm of breast: Secondary | ICD-10-CM

## 2024-05-08 NOTE — Telephone Encounter (Signed)
 Called patient again to try and get them rescheduled left a voicemail

## 2024-05-26 ENCOUNTER — Ambulatory Visit

## 2024-05-26 DIAGNOSIS — Z1231 Encounter for screening mammogram for malignant neoplasm of breast: Secondary | ICD-10-CM

## 2024-06-04 ENCOUNTER — Encounter: Payer: Self-pay | Admitting: Family Medicine

## 2024-06-07 ENCOUNTER — Ambulatory Visit (INDEPENDENT_AMBULATORY_CARE_PROVIDER_SITE_OTHER)

## 2024-06-07 ENCOUNTER — Ambulatory Visit (INDEPENDENT_AMBULATORY_CARE_PROVIDER_SITE_OTHER): Admitting: Audiology

## 2024-06-14 ENCOUNTER — Ambulatory Visit (INDEPENDENT_AMBULATORY_CARE_PROVIDER_SITE_OTHER)

## 2024-06-14 ENCOUNTER — Ambulatory Visit
Admission: RE | Admit: 2024-06-14 | Discharge: 2024-06-14 | Disposition: A | Source: Ambulatory Visit | Attending: Family Medicine | Admitting: Family Medicine

## 2024-06-14 DIAGNOSIS — Z1231 Encounter for screening mammogram for malignant neoplasm of breast: Secondary | ICD-10-CM

## 2024-06-26 ENCOUNTER — Ambulatory Visit: Admitting: Family Medicine

## 2024-06-29 ENCOUNTER — Encounter: Admitting: Neurology

## 2024-07-07 ENCOUNTER — Encounter: Admitting: Neurology

## 2024-07-14 ENCOUNTER — Ambulatory Visit (INDEPENDENT_AMBULATORY_CARE_PROVIDER_SITE_OTHER): Admitting: Audiology

## 2024-07-14 ENCOUNTER — Ambulatory Visit (INDEPENDENT_AMBULATORY_CARE_PROVIDER_SITE_OTHER)

## 2024-07-26 ENCOUNTER — Ambulatory Visit
